# Patient Record
Sex: Female | Born: 1955
Health system: Southern US, Community
[De-identification: ages and names within clinical notes are randomized; demographics above are authoritative.]

## PROBLEM LIST (undated history)

## (undated) DIAGNOSIS — F419 Anxiety disorder, unspecified: Secondary | ICD-10-CM

## (undated) DIAGNOSIS — I10 Essential (primary) hypertension: Secondary | ICD-10-CM

## (undated) DIAGNOSIS — F319 Bipolar disorder, unspecified: Secondary | ICD-10-CM

## (undated) DIAGNOSIS — T7840XA Allergy, unspecified, initial encounter: Secondary | ICD-10-CM

## (undated) DIAGNOSIS — D649 Anemia, unspecified: Secondary | ICD-10-CM

## (undated) DIAGNOSIS — F1910 Other psychoactive substance abuse, uncomplicated: Secondary | ICD-10-CM

## (undated) DIAGNOSIS — F329 Major depressive disorder, single episode, unspecified: Secondary | ICD-10-CM

## (undated) DIAGNOSIS — K219 Gastro-esophageal reflux disease without esophagitis: Secondary | ICD-10-CM

## (undated) DIAGNOSIS — M1990 Unspecified osteoarthritis, unspecified site: Secondary | ICD-10-CM

## (undated) DIAGNOSIS — M419 Scoliosis, unspecified: Secondary | ICD-10-CM

## (undated) DIAGNOSIS — F32A Depression, unspecified: Secondary | ICD-10-CM

## (undated) DIAGNOSIS — T4145XA Adverse effect of unspecified anesthetic, initial encounter: Secondary | ICD-10-CM

## (undated) DIAGNOSIS — T8859XA Other complications of anesthesia, initial encounter: Secondary | ICD-10-CM

## (undated) HISTORY — DX: Gastro-esophageal reflux disease without esophagitis: K21.9

## (undated) HISTORY — DX: Anemia, unspecified: D64.9

## (undated) HISTORY — DX: Bipolar disorder, unspecified: F31.9

## (undated) HISTORY — DX: Allergy, unspecified, initial encounter: T78.40XA

## (undated) HISTORY — DX: Anxiety disorder, unspecified: F41.9

## (undated) HISTORY — DX: Other psychoactive substance abuse, uncomplicated: F19.10

## (undated) HISTORY — DX: Essential (primary) hypertension: I10

## (undated) HISTORY — PX: TONSILECTOMY, ADENOIDECTOMY, BILATERAL MYRINGOTOMY AND TUBES: SHX2538

## (undated) HISTORY — DX: Major depressive disorder, single episode, unspecified: F32.9

## (undated) HISTORY — PX: TONSILLECTOMY: SUR1361

## (undated) HISTORY — DX: Depression, unspecified: F32.A

## (undated) HISTORY — PX: CARPAL TUNNEL RELEASE: SHX101

## (undated) HISTORY — PX: OTHER SURGICAL HISTORY: SHX169

## (undated) HISTORY — DX: Unspecified osteoarthritis, unspecified site: M19.90

---

## 1981-08-26 HISTORY — PX: OTHER SURGICAL HISTORY: SHX169

## 1998-09-26 DIAGNOSIS — I10 Essential (primary) hypertension: Secondary | ICD-10-CM | POA: Insufficient documentation

## 2002-10-29 ENCOUNTER — Emergency Department (HOSPITAL_COMMUNITY): Admission: EM | Admit: 2002-10-29 | Discharge: 2002-10-29 | Payer: Self-pay | Admitting: *Deleted

## 2002-10-29 ENCOUNTER — Encounter: Payer: Self-pay | Admitting: *Deleted

## 2002-12-28 ENCOUNTER — Encounter (HOSPITAL_COMMUNITY): Admission: RE | Admit: 2002-12-28 | Discharge: 2003-01-27 | Payer: Self-pay | Admitting: Orthopedic Surgery

## 2005-12-11 ENCOUNTER — Emergency Department (HOSPITAL_COMMUNITY): Admission: EM | Admit: 2005-12-11 | Discharge: 2005-12-11 | Payer: Self-pay | Admitting: Emergency Medicine

## 2006-01-28 ENCOUNTER — Emergency Department (HOSPITAL_COMMUNITY): Admission: EM | Admit: 2006-01-28 | Discharge: 2006-01-28 | Payer: Self-pay | Admitting: Emergency Medicine

## 2006-01-31 ENCOUNTER — Emergency Department (HOSPITAL_COMMUNITY): Admission: EM | Admit: 2006-01-31 | Discharge: 2006-01-31 | Payer: Self-pay | Admitting: Emergency Medicine

## 2009-04-21 ENCOUNTER — Ambulatory Visit: Payer: Self-pay | Admitting: Gastroenterology

## 2009-04-21 ENCOUNTER — Encounter (INDEPENDENT_AMBULATORY_CARE_PROVIDER_SITE_OTHER): Payer: Self-pay | Admitting: *Deleted

## 2009-04-21 DIAGNOSIS — K219 Gastro-esophageal reflux disease without esophagitis: Secondary | ICD-10-CM | POA: Insufficient documentation

## 2009-04-21 DIAGNOSIS — F341 Dysthymic disorder: Secondary | ICD-10-CM | POA: Insufficient documentation

## 2009-04-21 DIAGNOSIS — Z8659 Personal history of other mental and behavioral disorders: Secondary | ICD-10-CM | POA: Insufficient documentation

## 2009-04-21 DIAGNOSIS — K5900 Constipation, unspecified: Secondary | ICD-10-CM | POA: Insufficient documentation

## 2009-04-21 DIAGNOSIS — R6881 Early satiety: Secondary | ICD-10-CM | POA: Insufficient documentation

## 2009-04-21 DIAGNOSIS — M4120 Other idiopathic scoliosis, site unspecified: Secondary | ICD-10-CM | POA: Insufficient documentation

## 2009-04-21 DIAGNOSIS — J45909 Unspecified asthma, uncomplicated: Secondary | ICD-10-CM | POA: Insufficient documentation

## 2009-04-21 DIAGNOSIS — R1013 Epigastric pain: Secondary | ICD-10-CM | POA: Insufficient documentation

## 2009-04-24 ENCOUNTER — Telehealth (INDEPENDENT_AMBULATORY_CARE_PROVIDER_SITE_OTHER): Payer: Self-pay

## 2009-04-25 ENCOUNTER — Encounter: Payer: Self-pay | Admitting: Gastroenterology

## 2009-05-08 ENCOUNTER — Ambulatory Visit (HOSPITAL_COMMUNITY): Admission: RE | Admit: 2009-05-08 | Discharge: 2009-05-08 | Payer: Self-pay | Admitting: Gastroenterology

## 2009-05-08 ENCOUNTER — Ambulatory Visit: Payer: Self-pay | Admitting: Gastroenterology

## 2009-05-08 ENCOUNTER — Encounter: Payer: Self-pay | Admitting: Gastroenterology

## 2009-05-09 ENCOUNTER — Encounter: Payer: Self-pay | Admitting: Urgent Care

## 2009-05-10 ENCOUNTER — Encounter (INDEPENDENT_AMBULATORY_CARE_PROVIDER_SITE_OTHER): Payer: Self-pay

## 2009-05-17 ENCOUNTER — Telehealth (INDEPENDENT_AMBULATORY_CARE_PROVIDER_SITE_OTHER): Payer: Self-pay

## 2009-05-22 ENCOUNTER — Encounter: Payer: Self-pay | Admitting: Gastroenterology

## 2009-12-13 ENCOUNTER — Encounter: Admission: RE | Admit: 2009-12-13 | Discharge: 2009-12-13 | Payer: Self-pay | Admitting: Emergency Medicine

## 2010-03-19 ENCOUNTER — Ambulatory Visit (HOSPITAL_COMMUNITY): Admission: RE | Admit: 2010-03-19 | Discharge: 2010-03-19 | Payer: Self-pay | Admitting: Gastroenterology

## 2010-05-17 ENCOUNTER — Encounter (HOSPITAL_COMMUNITY): Admission: RE | Admit: 2010-05-17 | Discharge: 2010-07-26 | Payer: Self-pay | Admitting: Nephrology

## 2010-05-21 ENCOUNTER — Inpatient Hospital Stay (HOSPITAL_COMMUNITY): Admission: RE | Admit: 2010-05-21 | Discharge: 2010-05-24 | Payer: Self-pay | Admitting: Surgery

## 2010-05-21 HISTORY — PX: OTHER SURGICAL HISTORY: SHX169

## 2010-11-08 LAB — CBC
HCT: 26.9 % — ABNORMAL LOW (ref 36.0–46.0)
HCT: 27.7 % — ABNORMAL LOW (ref 36.0–46.0)
HCT: 30.8 % — ABNORMAL LOW (ref 36.0–46.0)
Hemoglobin: 8.8 g/dL — ABNORMAL LOW (ref 12.0–15.0)
Hemoglobin: 9.2 g/dL — ABNORMAL LOW (ref 12.0–15.0)
MCH: 28.7 pg (ref 26.0–34.0)
MCH: 28.8 pg (ref 26.0–34.0)
MCHC: 32.9 g/dL (ref 30.0–36.0)
MCV: 86.1 fL (ref 78.0–100.0)
MCV: 87.8 fL (ref 78.0–100.0)
Platelets: 219 10*3/uL (ref 150–400)
RBC: 2.92 MIL/uL — ABNORMAL LOW (ref 3.87–5.11)
RBC: 3.06 MIL/uL — ABNORMAL LOW (ref 3.87–5.11)
RDW: 15.8 % — ABNORMAL HIGH (ref 11.5–15.5)
RDW: 16.6 % — ABNORMAL HIGH (ref 11.5–15.5)
WBC: 5.2 10*3/uL (ref 4.0–10.5)
WBC: 6.7 10*3/uL (ref 4.0–10.5)

## 2010-11-08 LAB — POTASSIUM
Potassium: 3.4 mEq/L — ABNORMAL LOW (ref 3.5–5.1)
Potassium: 3.6 mEq/L (ref 3.5–5.1)

## 2010-11-08 LAB — CREATININE, SERUM
Creatinine, Ser: 1.14 mg/dL (ref 0.4–1.2)
GFR calc Af Amer: >60 mL/min (ref >60)
GFR calc non Af Amer: 50 mL/min — ABNORMAL LOW (ref >60)

## 2010-11-08 LAB — BASIC METABOLIC PANEL
BUN: 14 mg/dL (ref 6–23)
Chloride: 114 mEq/L — ABNORMAL HIGH (ref 96–112)
Glucose, Bld: 94 mg/dL (ref 70–99)
Potassium: 4.1 mEq/L (ref 3.5–5.1)

## 2010-11-08 LAB — SURGICAL PCR SCREEN: Staphylococcus aureus: INVALID — AB

## 2011-01-01 ENCOUNTER — Other Ambulatory Visit: Payer: Self-pay | Admitting: Physical Medicine and Rehabilitation

## 2011-01-01 DIAGNOSIS — M545 Low back pain, unspecified: Secondary | ICD-10-CM

## 2011-01-26 ENCOUNTER — Ambulatory Visit
Admission: RE | Admit: 2011-01-26 | Discharge: 2011-01-26 | Disposition: A | Discharge status: Home / Self Care | Payer: 59 | Source: Ambulatory Visit | Attending: Physical Medicine and Rehabilitation | Admitting: Physical Medicine and Rehabilitation

## 2011-01-26 DIAGNOSIS — M545 Low back pain, unspecified: Secondary | ICD-10-CM

## 2011-09-02 ENCOUNTER — Ambulatory Visit (INDEPENDENT_AMBULATORY_CARE_PROVIDER_SITE_OTHER): Payer: 59

## 2011-09-02 DIAGNOSIS — R110 Nausea: Secondary | ICD-10-CM

## 2011-09-02 DIAGNOSIS — R42 Dizziness and giddiness: Secondary | ICD-10-CM

## 2011-09-02 DIAGNOSIS — H6120 Impacted cerumen, unspecified ear: Secondary | ICD-10-CM

## 2011-09-04 ENCOUNTER — Telehealth (INDEPENDENT_AMBULATORY_CARE_PROVIDER_SITE_OTHER): Payer: Self-pay

## 2011-09-04 NOTE — Telephone Encounter (Signed)
Get an UGI to evaluate the wrap Have the pt do thin, noncarbonated liquids x 2 days & readvance diet slowly (pureed - soft - reg)

## 2011-09-04 NOTE — Telephone Encounter (Signed)
Returned pt's voicemail message for her to call me back./ AS

## 2011-09-04 NOTE — Telephone Encounter (Signed)
Pt returned my call. The pt is having some issues with heartburn and reflux after eating meals. The pt has actually vomited several times with this reflux. The pt said this heartburn is different from before surgery.I asked pt if her food was sticking and she said no there is no problem with food going down. This started after her Lap Nissen in September 2012.  The pt had alcohol the other night and got really sick afterwards with vomiting. The pt has an appt with Dr Michaell Cowing on 09-16-11. I will send Dr Michaell Cowing a message to see if he wants pt to have a xray or labs done before her appt on 09-16-11.

## 2011-09-09 ENCOUNTER — Telehealth (INDEPENDENT_AMBULATORY_CARE_PROVIDER_SITE_OTHER): Payer: Self-pay

## 2011-09-09 DIAGNOSIS — R12 Heartburn: Secondary | ICD-10-CM

## 2011-09-09 NOTE — Telephone Encounter (Signed)
[facility] notifying pt that we were going to schedule pt for an xray before her f/u appt with Dr Michaell Cowing on 09-16-11. I gave the xray request to Stanton Kidney and she will contact the  Pt.

## 2011-09-12 ENCOUNTER — Ambulatory Visit (HOSPITAL_COMMUNITY)
Admission: RE | Admit: 2011-09-12 | Discharge: 2011-09-12 | Disposition: A | Discharge status: Home / Self Care | Payer: 59 | Source: Ambulatory Visit | Attending: Surgery | Admitting: Surgery

## 2011-09-12 DIAGNOSIS — K449 Diaphragmatic hernia without obstruction or gangrene: Secondary | ICD-10-CM | POA: Insufficient documentation

## 2011-09-12 DIAGNOSIS — R079 Chest pain, unspecified: Secondary | ICD-10-CM | POA: Insufficient documentation

## 2011-09-12 DIAGNOSIS — M47812 Spondylosis without myelopathy or radiculopathy, cervical region: Secondary | ICD-10-CM | POA: Insufficient documentation

## 2011-09-12 DIAGNOSIS — R12 Heartburn: Secondary | ICD-10-CM

## 2011-09-12 DIAGNOSIS — K225 Diverticulum of esophagus, acquired: Secondary | ICD-10-CM | POA: Insufficient documentation

## 2011-09-12 DIAGNOSIS — R1013 Epigastric pain: Secondary | ICD-10-CM | POA: Insufficient documentation

## 2011-09-16 ENCOUNTER — Encounter (INDEPENDENT_AMBULATORY_CARE_PROVIDER_SITE_OTHER): Payer: Self-pay | Admitting: Surgery

## 2011-09-16 ENCOUNTER — Ambulatory Visit (INDEPENDENT_AMBULATORY_CARE_PROVIDER_SITE_OTHER): Payer: 59 | Admitting: Surgery

## 2011-09-16 DIAGNOSIS — K219 Gastro-esophageal reflux disease without esophagitis: Secondary | ICD-10-CM

## 2011-09-16 DIAGNOSIS — K449 Diaphragmatic hernia without obstruction or gangrene: Secondary | ICD-10-CM | POA: Insufficient documentation

## 2011-09-16 MED ORDER — METOCLOPRAMIDE HCL 5 MG PO TABS: 5.0000 mg | ORAL_TABLET | Freq: Four times a day (QID) | ORAL | Status: DC | PRN

## 2011-09-16 NOTE — Progress Notes (Signed)
Subjective:     Patient ID: Cheryl Hoover, female   DOB: 10-08-1955, 56 y.o.   MRN: [id number]  HPI  Cheryl Hoover  Jul 07, 1956 [id number]  Patient Care Team: Lucilla Edin, MD as PCP - General (Family Medicine) Theda Belfast, MD as Consulting Physician (Gastroenterology) Arlyce Harman, MD as Consulting Physician (Gastroenterology)  This patient is a 56 y.o.female who presents today for surgical evaluation.   Reason for visit: Retching/vomiting, recurrent reflux s/p lap PEH/Nissen  Pt is an obese female that I did a lap repair of a moderate paraesophageal hiatal hernia & Nissen fundoplication.  She had mild dysphagia but had good GERD/reflux control.  Occasional nausea controlled w promethazine - makes her sleepy.  More recently has used ondansetron.  She has had some episodes of severe nausea & retching & has been able to vomit.  She has gained 46lbs since before surgery - she claims her weight fluctuates like that.  She called noting reflux.  We did an UGI which shows the wrap has slipped a little & she has a small sliding recurrent hiatal hernia.  I recommended she start PPI omeprazole - that seems to help.  No fever/chills/sweats.  No sick contacts  Patient Active Problem List  Diagnoses  . ANXIETY DEPRESSION  . ASTHMA, MILD  . GERD  . CONSTIPATION  . SCOLIOSIS  . EARLY SATIETY  . ABDOMINAL PAIN, EPIGASTRIC  . ANOREXIA NERVOSA, HX OF  . BULIMIA, HX OF  . Sliding hiatal hernia, recurrent    Past Medical History  Diagnosis Date  . Allergy   . Anemia   . Anxiety   . Arthritis   . Asthma   . Depression   . GERD (gastroesophageal reflux disease)   . Hypertension     Past Surgical History  Procedure Date  . Lap nissen w/paraesophageal hernia repari 05-21-2010  . Carpal tunnel release   . Cesarean section 1982  . C-sections 1983  . Tonsilectomy, adenoidectomy, bilateral myringotomy and tubes   . Corrective eye surgery     History   Social History  .  Marital Status: Divorced    Spouse Name: N/A    Number of Children: N/A  . Years of Education: N/A   Occupational History  . Not on file.   Social History Main Topics  . Smoking status: Former Games developer  . Smokeless tobacco: Not on file  . Alcohol Use: Yes  . Drug Use: No  . Sexually Active:    Other Topics Concern  . Not on file   Social History Narrative  . No narrative on file    History reviewed. No pertinent family history.  Current outpatient prescriptions:ALPRAZolam (XANAX) 0.5 MG tablet, Take 0.5 mg by mouth at bedtime as needed., Disp: , Rfl: ;  buPROPion (WELLBUTRIN) 100 MG tablet, Take 200 mg by mouth 2 (two) times daily., Disp: , Rfl: ;  lamoTRIgine (LAMICTAL) 100 MG tablet, Take 100 mg by mouth daily., Disp: , Rfl: ;  lisinopril (PRINIVIL,ZESTRIL) 10 MG tablet, Take 10 mg by mouth daily., Disp: , Rfl:  montelukast (SINGULAIR) 10 MG tablet, Take 10 mg by mouth at bedtime., Disp: , Rfl: ;  ondansetron (ZOFRAN-ODT) 4 MG disintegrating tablet, Take 4 mg by mouth every 8 (eight) hours as needed., Disp: , Rfl: ;  promethazine (PHENERGAN) 12.5 MG tablet, Take 12.5-25 mg by mouth every 6 (six) hours as needed., Disp: , Rfl:  metoCLOPramide (REGLAN) 5 MG tablet, Take 1-2 tablets (5-10 mg total) by  mouth 4 (four) times daily as needed (use for breakthrough, severe nausea)., Disp: 30 tablet, Rfl: 3  Allergies  Allergen Reactions  . Penicillins     BP 132/82  Pulse 78  Temp(Src) 97.1 F (36.2 C) (Temporal)  Resp 16  Ht 5\' 6"  (1.676 m)  Wt 222 lb (100.699 kg)  BMI 35.83 kg/m2     Review of Systems  Constitutional: Negative for fever, chills and diaphoresis.  HENT: Negative for ear pain, sore throat and trouble swallowing.   Eyes: Negative for photophobia and visual disturbance.  Respiratory: Negative for cough and choking.   Cardiovascular: Negative for chest pain and palpitations.  Gastrointestinal: Positive for nausea and vomiting. Negative for abdominal pain,  diarrhea, constipation, blood in stool, anal bleeding and rectal pain.       Mild early satiety.  No severe bloating. No personal nor family history of GI/colon cancer, inflammatory bowel disease, irritable bowel syndrome, allergy such as Celiac Sprue, dietary/dairy problems, colitis, ulcers nor gastritis.    No recent sick contacts/gastroenteritis.  No travel outside the country.  No changes in diet.    Genitourinary: Negative for dysuria, frequency and difficulty urinating.  Musculoskeletal: Negative for myalgias and gait problem.  Skin: Negative for color change, pallor and rash.  Neurological: Negative for dizziness, speech difficulty, weakness and numbness.  Hematological: Negative for adenopathy.  Psychiatric/Behavioral: Negative for confusion and agitation. The patient is not nervous/anxious.        Objective:   Physical Exam  Constitutional: She is oriented to person, place, and time. She appears well-developed and well-nourished. No distress.  HENT:  Head: Normocephalic.  Mouth/Throat: Oropharynx is clear and moist. No oropharyngeal exudate.  Eyes: Conjunctivae and EOM are normal. Pupils are equal, round, and reactive to light. No scleral icterus.  Neck: Normal range of motion. Neck supple. No tracheal deviation present.  Cardiovascular: Normal rate, regular rhythm and intact distal pulses.   Pulmonary/Chest: Effort normal and breath sounds normal. No respiratory distress. She exhibits no tenderness.  Abdominal: Soft. She exhibits no distension and no mass. There is no tenderness. There is no rebound and no guarding. Hernia confirmed negative in the right inguinal area and confirmed negative in the left inguinal area.       Obese.  Apple-like/central obesity  Genitourinary: No vaginal discharge found.  Musculoskeletal: Normal range of motion. She exhibits no tenderness.  Lymphadenopathy:    She has no cervical adenopathy.       Right: No inguinal adenopathy present.        Left: No inguinal adenopathy present.  Neurological: She is alert and oriented to person, place, and time. No cranial nerve deficit. She exhibits normal muscle tone. Coordination normal.  Skin: Skin is warm and dry. No rash noted. She is not diaphoretic. No erythema.  Psychiatric: She has a normal mood and affect. Her behavior is normal. Judgment and thought content normal.       Assessment:     Recurrent sliding PEH with symptoms    Plan:     -PPI daily-BID to control Sx  -switch to smaller meals  -Control nausea more aggressively. Reglan for severe breakthrough N/V.  Consider gastric emptying study if not improved  -try to lose weight - increasing obesity markedly increases hernia worsening.  -I would avoid surgery if possible since OR risks with redo surgery are higher, and she would requires mesh to decrease recurrence.  Best to control the causes as possible.  -If has worsening Sx, consider e-evaluation with  GI to see if other options are available to maximize medical care

## 2011-09-16 NOTE — Patient Instructions (Signed)
Diet for GERD    Nutrition therapy can help ease the discomfort of gastroesophageal reflux disease (GERD) and peptic ulcer disease (PUD).  HOME CARE INSTRUCTIONS   Eat your meals slowly, in a relaxed setting.   Eat 5 to 6 small meals per day.   If a food causes distress, stop eating it for a period of time.  FOODS TO AVOID  Coffee, regular or decaffeinated.   Cola beverages, regular or low calorie.   Tea, regular or decaffeinated.   Pepper.   Cocoa.   High fat foods, including meats.   Butter, margarine, hydrogenated oil (trans fats).   Peppermint or spearmint (if you have GERD).   Fruits and vegetables if not tolerated.   Alcohol.   Nicotine (smoking or chewing). This is one of the most potent stimulants to acid production in the gastrointestinal tract.   Any food that seems to aggravate your condition.  If you have questions regarding your diet, ask your caregiver or a registered dietitian. TIPS  Lying flat may make symptoms worse. Keep the head of your bed raised 6 to 9 inches (15 to 23 cm) by using a foam wedge or blocks under the legs of the bed.   Do not lay down until 3 hours after eating a meal.   Daily physical activity may help reduce symptoms.  MAKE SURE YOU:   Understand these instructions.   Will watch your condition.   Will get help right away if you are not doing well or get worse.  Document Released: 08/12/2005 Document Revised: 04/24/2011 Document Reviewed: 12/26/2008 [employer] Patient Information 2012 [employer]. 

## 2011-09-18 NOTE — Progress Notes (Signed)
Pt last seen by DR.  in 2010. Pt not a pt of [facility].

## 2011-10-20 ENCOUNTER — Other Ambulatory Visit: Payer: Self-pay | Admitting: Emergency Medicine

## 2011-10-20 ENCOUNTER — Other Ambulatory Visit: Payer: Self-pay | Admitting: Physician Assistant

## 2011-11-07 ENCOUNTER — Other Ambulatory Visit: Payer: Self-pay | Admitting: Emergency Medicine

## 2011-12-05 ENCOUNTER — Encounter: Payer: Self-pay | Admitting: *Deleted

## 2011-12-05 ENCOUNTER — Ambulatory Visit (INDEPENDENT_AMBULATORY_CARE_PROVIDER_SITE_OTHER): Payer: 59 | Admitting: Internal Medicine

## 2011-12-05 VITALS — HR 96 | Temp 98.8°F | Resp 18 | Ht 64.5 in | Wt 211.0 lb

## 2011-12-05 DIAGNOSIS — M542 Cervicalgia: Secondary | ICD-10-CM

## 2011-12-05 DIAGNOSIS — I10 Essential (primary) hypertension: Secondary | ICD-10-CM

## 2011-12-05 DIAGNOSIS — J309 Allergic rhinitis, unspecified: Secondary | ICD-10-CM

## 2011-12-05 MED ORDER — KETOROLAC TROMETHAMINE 60 MG/2ML IM SOLN
60.0000 mg | Freq: Once | INTRAMUSCULAR | Status: AC
  Administered 2011-12-05: 60 mg via INTRAMUSCULAR

## 2011-12-05 MED ORDER — LISINOPRIL 5 MG PO TABS: 5.0000 mg | ORAL_TABLET | Freq: Once | ORAL | Status: DC

## 2011-12-05 MED ORDER — MONTELUKAST SODIUM 10 MG PO TABS: 10.0000 mg | ORAL_TABLET | Freq: Every day | ORAL | Status: DC

## 2011-12-05 MED ORDER — ALBUTEROL SULFATE HFA 108 (90 BASE) MCG/ACT IN AERS: 2.0000 | INHALATION_SPRAY | Freq: Four times a day (QID) | RESPIRATORY_TRACT | Status: DC | PRN

## 2011-12-05 MED ORDER — FLUTICASONE-SALMETEROL 250-50 MCG/DOSE IN AEPB: 1.0000 | INHALATION_SPRAY | Freq: Two times a day (BID) | RESPIRATORY_TRACT | Status: DC

## 2011-12-05 NOTE — Patient Instructions (Signed)
For her neck pain use heat and gentle massage, be sure your pillow is supportive of your neck when you are sleeping.  Return if this pain persists and does not get better in 3 days. For your sinus congestion restart your Patanase spray.  Take your Advair twice daily everyday during allergy season.  Return if not better in three days.  MAKE AN APPOINTMENT FOR AN ANNUAL EXAM TO COVER ALL YOUR OTHER CONCERNS, WE NEED FOR YOU TO DO THIS BEFORE WE CAN REFILL YOUR MEDICATION Torticollis, Acute You have suddenly (acutely) developed a twisted neck (torticollis). This is usually a self-limited condition. CAUSES  Acute torticollis may be caused by malposition, trauma or infection. Most commonly, acute torticollis is caused by sleeping in an awkward position. Torticollis may also be caused by the flexion, extension or twisting of the neck muscles beyond their normal position. Sometimes, the exact cause may not be known. SYMPTOMS  Usually, there is pain and limited movement of the neck. Your neck may twist to one side. DIAGNOSIS  The diagnosis is often made by physical examination. X-rays, CT scans or MRIs may be done if there is a history of trauma or concern of infection. TREATMENT  For a common, stiff neck that develops during sleep, treatment is focused on relaxing the contracted neck muscle. Medications (including shots) may be used to treat the problem. Most cases resolve in several days. Torticollis usually responds to conservative physical therapy. If left untreated, the shortened and spastic neck muscle can cause deformities in the face and neck. Rarely, surgery is required. HOME CARE INSTRUCTIONS   Use over-the-counter and prescription medications as directed by your caregiver.   Do stretching exercises and massage the neck as directed by your caregiver.   Follow up with physical therapy if needed and as directed by your caregiver.  SEEK IMMEDIATE MEDICAL CARE IF:   You develop difficulty  breathing or noisy breathing (stridor).   You drool, develop trouble swallowing or have pain with swallowing.   You develop numbness or weakness in the hands or feet.   You have changes in speech or vision.   You have problems with urination or bowel movements.   You have difficulty walking.   You have a fever.   You have increased pain.  MAKE SURE YOU:   Understand these instructions.   Will watch your condition.   Will get help right away if you are not doing well or get worse.  Document Released: 08/09/2000 Document Revised: 08/01/2011 Document Reviewed: 09/20/2009 [facility] Patient Information 2012 [place], [place].

## 2011-12-05 NOTE — Progress Notes (Signed)
  Subjective:    Patient ID: Cheryl Hoover, female    DOB: 1956-04-04, 56 y.o.   MRN: [id number]  Sinusitis This is a new problem. The current episode started in the past 7 days. The problem is unchanged. The pain is moderate. Associated symptoms include congestion, headaches, a hoarse voice, neck pain and sinus pressure. Pertinent negatives include no coughing. Past treatments include nothing.  Cheryl Hoover comes in complaining of headache which is actually in her neck just beneath her skull, it has been present for two days ever since she left for a long period of time, hurts more to move her head.  She has had no fever, vomiting, or vision changes.  She has allergies and has not been taking her Advair except sometimes once a day.  She is asking for refills today on her Singulaire, Lisinopril (thinks she is on the 5 mg dose), Advair, and Albuterol inhalers.  Not really coughing but feels as though she can't take a deep breath.    Review of Systems  HENT: Positive for congestion, hoarse voice, neck pain and sinus pressure.   Eyes: Negative.   Respiratory: Positive for wheezing. Negative for apnea, cough, choking and chest tightness.   Cardiovascular: Negative.   Gastrointestinal: Negative.   Genitourinary: Negative.   Neurological: Positive for headaches.  Hematological: Negative.   Psychiatric/Behavioral: Negative.        Objective:   Physical Exam  Constitutional: She appears well-developed and well-nourished.  HENT:  Head: Normocephalic.  Right Ear: External ear normal.  Left Ear: External ear normal.  Mouth/Throat: Oropharynx is clear and moist. No oropharyngeal exudate.       No maxillary sinus tenderness, TM's clear  Eyes: Conjunctivae are normal.  Neck: Neck supple.  Cardiovascular: Normal rate, regular rhythm and normal heart sounds.        Pulse is 98  Pulmonary/Chest: Effort normal and breath sounds normal. No respiratory distress. She has no wheezes. She has no rales.    Lymphadenopathy:    She has no cervical adenopathy.          Assessment & Plan:  Allergic Rhinitis:  No sign of acute bacterial infection today on exam.  Take her Advair twice daily, use albuterol inhaler as needed.  Also add Patanase (which she says she has refills of) for her sinus congestion.  She takes Singulaire daily and may need an OTC antihistamine also, such as Claritin 10 mg, she is advised of this. Wry neck.  Toradol 60 mg IM today.  Use heat and gentle massage, advise [place] if this is not improving in 2-3 days.  Be sure you have a good supportive pillow at home to sleep on. HTN, refilled lisinopril today, needs to return for CPE, she agrees to do this soon.

## 2011-12-11 ENCOUNTER — Other Ambulatory Visit: Payer: Self-pay | Admitting: Physician Assistant

## 2011-12-27 ENCOUNTER — Other Ambulatory Visit: Payer: Self-pay | Admitting: Physician Assistant

## 2012-01-17 ENCOUNTER — Encounter: Payer: Self-pay | Admitting: Family Medicine

## 2012-01-17 ENCOUNTER — Ambulatory Visit (INDEPENDENT_AMBULATORY_CARE_PROVIDER_SITE_OTHER): Payer: 59 | Admitting: Family Medicine

## 2012-01-17 VITALS — BP 153/86 | HR 84 | Temp 97.7°F | Resp 16 | Ht 63.75 in | Wt 215.0 lb

## 2012-01-17 DIAGNOSIS — J301 Allergic rhinitis due to pollen: Secondary | ICD-10-CM

## 2012-01-17 DIAGNOSIS — J45909 Unspecified asthma, uncomplicated: Secondary | ICD-10-CM

## 2012-01-17 DIAGNOSIS — I10 Essential (primary) hypertension: Secondary | ICD-10-CM

## 2012-01-17 DIAGNOSIS — K219 Gastro-esophageal reflux disease without esophagitis: Secondary | ICD-10-CM

## 2012-01-17 LAB — BASIC METABOLIC PANEL
BUN: 18 mg/dL (ref 6–23)
Calcium: 9.2 mg/dL (ref 8.4–10.5)
Potassium: 4.4 mEq/L (ref 3.5–5.3)
Sodium: 140 mEq/L (ref 135–145)

## 2012-01-17 MED ORDER — FLUTICASONE-SALMETEROL 250-50 MCG/DOSE IN AEPB: 1.0000 | INHALATION_SPRAY | Freq: Two times a day (BID) | RESPIRATORY_TRACT | Status: DC

## 2012-01-17 MED ORDER — OMEPRAZOLE 20 MG PO CPDR: 20.0000 mg | DELAYED_RELEASE_CAPSULE | Freq: Every day | ORAL | Status: DC

## 2012-01-17 MED ORDER — LISINOPRIL 5 MG PO TABS: 5.0000 mg | ORAL_TABLET | Freq: Every day | ORAL | Status: DC

## 2012-01-17 MED ORDER — ALBUTEROL SULFATE HFA 108 (90 BASE) MCG/ACT IN AERS: 2.0000 | INHALATION_SPRAY | Freq: Four times a day (QID) | RESPIRATORY_TRACT | Status: DC | PRN

## 2012-01-17 MED ORDER — MONTELUKAST SODIUM 10 MG PO TABS: 10.0000 mg | ORAL_TABLET | Freq: Every day | ORAL | Status: DC

## 2012-01-17 NOTE — Progress Notes (Signed)
   Subjective:    Patient ID: Cheryl Hoover, female    DOB: 12-16-1955, 56 y.o.   MRN: [id number]  HPI Cheryl Hoover is a 56 y.o. female Usual patient fo Dr. Cleta Alberts.  Needs refills.  HTn - ran out of lisinopril last week. Usually on 5 mg, but may When taking meds, has not checked outside  BP's.  No new side effects.  Asthma - on Advair 250/50 - only taking once per day  Due to cost. Taking albuterol every morning to allow advair to work better. Taking singulair.  Allergies - using patanase NS but costly, not taking any oral antihistamines. Occasional afrin - 2 times per week.   GERD - takes prilosec qd.  Hx of hiatal hernia- heartburn returned. Was told to take prilosec.- store brand expensive, but tolerating med.  Review of Systems  Constitutional: Negative for fatigue and unexpected weight change.  Respiratory: Negative for chest tightness and shortness of breath.   Cardiovascular: Negative for chest pain, palpitations and leg swelling.  Gastrointestinal: Negative for abdominal pain and blood in stool.  Neurological: Negative for dizziness, syncope, light-headedness and headaches.       Objective:   Physical Exam  Constitutional: She is oriented to person, place, and time. She appears well-developed and well-nourished.  HENT:  Head: Normocephalic and atraumatic.  Right Ear: External ear normal.  Left Ear: External ear normal.  Mouth/Throat: Oropharynx is clear and moist.  Eyes: Conjunctivae and EOM are normal. Pupils are equal, round, and reactive to light.  Neck: Normal range of motion. No thyromegaly present.  Cardiovascular: Normal rate, normal heart sounds and intact distal pulses.   Pulmonary/Chest: No respiratory distress. She has no wheezes.  Lymphadenopathy:    She has no cervical adenopathy.  Neurological: She is alert and oriented to person, place, and time.  Skin: Skin is warm.  Psychiatric: She has a normal mood and affect. Her behavior is normal.           Assessment & Plan:   Cheryl Hoover is a 56 y.o. female  HTN - Check outside BP's - if >140/90 - needs to rtc for higher dose.  Continue lisinopril 5mg  qd for now.   Asthma stable.  Using albuterol as a preventative in am with advair.  Instructed to chang to prn use Advair daily.  If requiring albuterol greater than 3x/week - needs to take Advair BID, and rtc to discuss regimen. - if well controlled, may be able to change to ICS alone.   Cont singulair 10mg  QD. - meds refilled, no change in doses.  Allergies - try otc claritin - may decrease requirement of nasal spray. Decrease use of Afrin - discussed risk of rhinitis medicamentosa.  Cont same regimen with patanase (refilled and coupon given), but as allergies improve, consider change to less expensive option.  Recheck next 3 months with primary provider.

## 2012-02-14 ENCOUNTER — Ambulatory Visit: Payer: 59 | Admitting: Family Medicine

## 2012-04-21 ENCOUNTER — Ambulatory Visit (INDEPENDENT_AMBULATORY_CARE_PROVIDER_SITE_OTHER): Payer: 59 | Admitting: Emergency Medicine

## 2012-04-21 VITALS — BP 145/84 | HR 82 | Temp 97.1°F | Resp 16 | Ht 64.0 in | Wt 211.0 lb

## 2012-04-21 DIAGNOSIS — M79609 Pain in unspecified limb: Secondary | ICD-10-CM

## 2012-04-21 DIAGNOSIS — I10 Essential (primary) hypertension: Secondary | ICD-10-CM

## 2012-04-21 DIAGNOSIS — K219 Gastro-esophageal reflux disease without esophagitis: Secondary | ICD-10-CM

## 2012-04-21 DIAGNOSIS — IMO0001 Reserved for inherently not codable concepts without codable children: Secondary | ICD-10-CM

## 2012-04-21 DIAGNOSIS — M25569 Pain in unspecified knee: Secondary | ICD-10-CM

## 2012-04-21 MED ORDER — LISINOPRIL 5 MG PO TABS: 10.0000 mg | ORAL_TABLET | Freq: Every day | ORAL | Status: DC

## 2012-04-21 MED ORDER — LISINOPRIL 5 MG PO TABS: 5.0000 mg | ORAL_TABLET | Freq: Every day | ORAL | Status: DC

## 2012-04-21 MED ORDER — OMEPRAZOLE 20 MG PO CPDR: 20.0000 mg | DELAYED_RELEASE_CAPSULE | Freq: Every day | ORAL | Status: DC

## 2012-04-21 MED ORDER — RANITIDINE HCL 150 MG PO TABS: 150.0000 mg | ORAL_TABLET | Freq: Two times a day (BID) | ORAL | Status: DC

## 2012-04-21 MED ORDER — ALBUTEROL SULFATE HFA 108 (90 BASE) MCG/ACT IN AERS: 2.0000 | INHALATION_SPRAY | Freq: Four times a day (QID) | RESPIRATORY_TRACT | Status: DC | PRN

## 2012-04-21 NOTE — Progress Notes (Signed)
  Subjective:    Patient ID: Cheryl Hoover, female    DOB: 1955/11/12, 56 y.o.   MRN: [id number]  HPI patient here to followup her high blood pressure. About 2 weeks ago she had some discomfort in her left arm and was worried it could be secondary to her heart and so made an appointment. She also has had a swollen area over the lower right thigh she would like checked.    Review of Systems     Objective:   Physical Exam her HEENT exam is unremarkable. She has full range of motion of the left arm there is no tenderness over the left biceps where she was having pain. Chest is clear endoscopy patient percussion. Heart regular rate no murmurs. Abdomen is soft nontender. 2 x 3 cm lipoma over the lower right anterior thigh.  EKG normal      Assessment & Plan:  And increase her blood pressure medications since she is not at goal. Her EKG is normal and I do not think she needs any further evaluation of this. Her left shoulder pain sounded musculoskeletal. The lesion in her right thigh has a very benign feel of a lipoma.

## 2012-04-26 HISTORY — PX: HERNIA REPAIR: SHX51

## 2012-05-06 ENCOUNTER — Other Ambulatory Visit: Payer: Self-pay | Admitting: Family Medicine

## 2012-05-07 ENCOUNTER — Other Ambulatory Visit: Payer: Self-pay | Admitting: Family Medicine

## 2012-05-07 NOTE — Telephone Encounter (Signed)
OK X 1, NEEDS OV FOR MORE?

## 2012-06-16 ENCOUNTER — Telehealth: Payer: Self-pay | Admitting: Radiology

## 2012-06-16 ENCOUNTER — Telehealth: Payer: Self-pay

## 2012-06-16 MED ORDER — MONTELUKAST SODIUM 10 MG PO TABS: ORAL_TABLET | ORAL | Status: DC

## 2012-06-16 MED ORDER — MONTELUKAST SODIUM 10 MG PO TABS: 10.0000 mg | ORAL_TABLET | Freq: Every day | ORAL | Status: DC

## 2012-06-16 NOTE — Telephone Encounter (Signed)
Please advise on renewal of Singulair. Have gotten fax. Sent to [facility] for electronic Rx.  At last renewal patient was advised needs visit with PCP.

## 2012-06-16 NOTE — Telephone Encounter (Signed)
Pt calling to speak with Amy L. [phone]

## 2012-06-16 NOTE — Telephone Encounter (Signed)
Sent singulair to pharmacy, pt needs OV for more - 2nd notice

## 2012-06-16 NOTE — Telephone Encounter (Signed)
Patient called to let us know she has been in since her last renewal of Singulair, have taken off the note about needing follow up. Amy

## 2012-06-16 NOTE — Telephone Encounter (Signed)
Called patient left message for her to advise.

## 2012-06-17 ENCOUNTER — Other Ambulatory Visit: Payer: Self-pay | Admitting: Radiology

## 2012-09-25 ENCOUNTER — Other Ambulatory Visit: Payer: Self-pay | Admitting: Physician Assistant

## 2012-10-13 ENCOUNTER — Other Ambulatory Visit: Payer: Self-pay | Admitting: Family Medicine

## 2012-10-25 ENCOUNTER — Other Ambulatory Visit: Payer: Self-pay | Admitting: Physician Assistant

## 2012-10-25 ENCOUNTER — Other Ambulatory Visit: Payer: Self-pay | Admitting: Emergency Medicine

## 2012-12-07 ENCOUNTER — Other Ambulatory Visit: Payer: Self-pay | Admitting: Physician Assistant

## 2012-12-09 ENCOUNTER — Ambulatory Visit (INDEPENDENT_AMBULATORY_CARE_PROVIDER_SITE_OTHER): Payer: 59 | Admitting: Family Medicine

## 2012-12-09 VITALS — BP 150/100 | HR 81 | Temp 97.8°F | Resp 18 | Ht 63.5 in | Wt 217.0 lb

## 2012-12-09 DIAGNOSIS — J45909 Unspecified asthma, uncomplicated: Secondary | ICD-10-CM

## 2012-12-09 DIAGNOSIS — K219 Gastro-esophageal reflux disease without esophagitis: Secondary | ICD-10-CM

## 2012-12-09 DIAGNOSIS — I10 Essential (primary) hypertension: Secondary | ICD-10-CM

## 2012-12-09 DIAGNOSIS — IMO0001 Reserved for inherently not codable concepts without codable children: Secondary | ICD-10-CM

## 2012-12-09 MED ORDER — FLUTICASONE-SALMETEROL 250-50 MCG/DOSE IN AEPB: INHALATION_SPRAY | RESPIRATORY_TRACT | Status: DC

## 2012-12-09 MED ORDER — MONTELUKAST SODIUM 10 MG PO TABS: ORAL_TABLET | ORAL | Status: DC

## 2012-12-09 MED ORDER — RANITIDINE HCL 150 MG PO TABS: 150.0000 mg | ORAL_TABLET | Freq: Two times a day (BID) | ORAL | Status: DC

## 2012-12-09 MED ORDER — LISINOPRIL 10 MG PO TABS: 10.0000 mg | ORAL_TABLET | Freq: Every day | ORAL | Status: DC

## 2012-12-09 MED ORDER — ALBUTEROL SULFATE HFA 108 (90 BASE) MCG/ACT IN AERS: 2.0000 | INHALATION_SPRAY | RESPIRATORY_TRACT | Status: DC | PRN

## 2012-12-09 NOTE — Progress Notes (Signed)
Chief complaint: Medication refilled  History of present illness: Patient has multiple problems.  1. Hypertension Blood pressure at home: not checking Blood pressure today: 150/100 Taking Meds: no ran out Side effects:no ROS: Denies headache visual changes nausea, vomiting, chest pain or abdominal pain or shortness of breath.  History of asthma: Patient usually uses her Advair as well as Singulair. Patient does have seasonal allergies and this is the worst time of season for her. Patient has had some increasing shortness of breath but denies any swelling of the lower extremities, denies any cough, and states that when she has her inhaler, Ventolin, as well as Advair and her Singulair she usually does fine but unfortunately his abdomen at this time. Patient is still been able to do all her activities of daily living and sleeping fairly comfortably but knows that she needs to have these medications to make it through the spring season.  History of gastroesophageal reflux disease patient also has a history of a hiatal hernia. Patient usually takes omeprazole as well as ranitidine. Patient has been out of her ranitidine for quite some time. Patient states that she has had increasing pain with eating certain foods. Patient is being followed by another physician for this hiatal hernia.  Past medical history, social, surgical and family history all reviewed.   Denies fever, chills, nausea vomiting abdominal pain, dysuria, chest pain, shortness of breath dyspnea on exertion or numbness in extremities  Physical exam Blood pressure 150/100, pulse 81, temperature 97.8 F (36.6 C), temperature source Oral, resp. rate 18, height 5' 3.5" (1.613 m), weight 217 lb (98.431 kg), SpO2 96.00%. Constitutional: She is oriented to person, place, and time. She appears well-developed and well-nourished.  HENT:  Head: Normocephalic and atraumatic.  Right Ear: External ear normal.  Left Ear: External ear normal.   Mouth/Throat: Oropharynx is clear and moist.  Eyes: Conjunctivae and EOM are normal. Pupils are equal, round, and reactive to light.  Neck: Normal range of motion. No thyromegaly present.  Cardiovascular: Normal rate, normal heart sounds and intact distal pulses.  Pulmonary/Chest: No respiratory distress. She has no wheezes.  Lymphadenopathy:  She has no cervical adenopathy.  Neurological: She is alert and oriented to person, place, and time.  Skin: Skin is warm.  Psychiatric: She has a normal mood and affect. Her behavior is normal.    Assessment: 1. HTN 2. Asthma 3. Seasonal allergies  Plan: Refilled Singulair, ventolin, lisinopril, advair and rantidine.  Will return in 2 weeks to recheck blood pressure and BMET.

## 2012-12-09 NOTE — Patient Instructions (Signed)
I refilled the medications you needed.  Please come back in 2 weeks and have your blood drawn to make sure your blood pressure medicine is working and potassium is not too high.

## 2013-05-13 ENCOUNTER — Other Ambulatory Visit: Payer: Self-pay

## 2013-05-13 MED ORDER — LISINOPRIL 10 MG PO TABS: 10.0000 mg | ORAL_TABLET | Freq: Every day | ORAL | Status: DC

## 2013-06-30 ENCOUNTER — Other Ambulatory Visit: Payer: Self-pay

## 2013-06-30 MED ORDER — MONTELUKAST SODIUM 10 MG PO TABS: ORAL_TABLET | ORAL | Status: DC

## 2013-07-15 ENCOUNTER — Other Ambulatory Visit: Payer: Self-pay | Admitting: Physician Assistant

## 2013-07-20 ENCOUNTER — Other Ambulatory Visit: Payer: Self-pay

## 2013-07-20 DIAGNOSIS — IMO0001 Reserved for inherently not codable concepts without codable children: Secondary | ICD-10-CM

## 2013-07-20 MED ORDER — RANITIDINE HCL 150 MG PO TABS: 150.0000 mg | ORAL_TABLET | Freq: Two times a day (BID) | ORAL | Status: DC

## 2013-12-28 ENCOUNTER — Other Ambulatory Visit: Payer: Self-pay

## 2013-12-28 MED ORDER — ALBUTEROL SULFATE HFA 108 (90 BASE) MCG/ACT IN AERS: INHALATION_SPRAY | RESPIRATORY_TRACT | Status: DC

## 2014-05-07 ENCOUNTER — Ambulatory Visit (INDEPENDENT_AMBULATORY_CARE_PROVIDER_SITE_OTHER): Payer: 59 | Admitting: Family Medicine

## 2014-05-07 VITALS — BP 136/92 | HR 73 | Temp 97.3°F | Resp 19 | Ht 63.5 in | Wt 216.4 lb

## 2014-05-07 DIAGNOSIS — H6590 Unspecified nonsuppurative otitis media, unspecified ear: Secondary | ICD-10-CM

## 2014-05-07 DIAGNOSIS — M4120 Other idiopathic scoliosis, site unspecified: Secondary | ICD-10-CM

## 2014-05-07 DIAGNOSIS — M546 Pain in thoracic spine: Secondary | ICD-10-CM

## 2014-05-07 DIAGNOSIS — J441 Chronic obstructive pulmonary disease with (acute) exacerbation: Secondary | ICD-10-CM

## 2014-05-07 MED ORDER — AZITHROMYCIN 250 MG PO TABS: ORAL_TABLET | ORAL | Status: DC

## 2014-05-07 MED ORDER — IPRATROPIUM BROMIDE 0.02 % IN SOLN
0.5000 mg | Freq: Once | RESPIRATORY_TRACT | Status: AC
  Administered 2014-05-07: 0.5 mg via RESPIRATORY_TRACT

## 2014-05-07 MED ORDER — ALBUTEROL SULFATE (2.5 MG/3ML) 0.083% IN NEBU
2.5000 mg | INHALATION_SOLUTION | Freq: Once | RESPIRATORY_TRACT | Status: AC
  Administered 2014-05-07: 2.5 mg via RESPIRATORY_TRACT

## 2014-05-07 MED ORDER — PREDNISONE 20 MG PO TABS: 40.0000 mg | ORAL_TABLET | Freq: Every day | ORAL | Status: DC

## 2014-05-07 NOTE — Progress Notes (Signed)
Subjective:  This chart was scribed for Cheryl Sidle, MD by Elveria Rising, Medial Scribe. This patient was seen in room 2 and the patient's care was started at 11:00 AM.   Patient ID: Cheryl Hoover, female    DOB: Jan 05, 1956, 58 y.o.   MRN: [id number]  HPI HPI Comments: Cheryl Hoover is a 58 y.o. female who presents to the [facility] complaining of productive cough with sputum with shortness of breath, congestion for two weeks now. This week she reports decreased hearing bilaterally. She reports recommendation of hearing aids. Patient denies sleep disturbance with symptoms. Patient reports smoking cessation five years ago.  Patient has history of scoliosis and arthritis for which she takes Hydrocodone. Patient denies physical therapy for her scoliosis, but agrees to treatment if it is available in [place].   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   Patient Active Problem List   Diagnosis Date Noted   Sliding hiatal hernia, recurrent 09/16/2011   ANXIETY DEPRESSION 04/21/2009   ASTHMA, MILD 04/21/2009   GERD 04/21/2009   CONSTIPATION 04/21/2009   SCOLIOSIS 04/21/2009   EARLY SATIETY 04/21/2009   ABDOMINAL PAIN, EPIGASTRIC 04/21/2009   ANOREXIA NERVOSA, HX OF 04/21/2009   BULIMIA, HX OF 04/21/2009   Past Medical History  Diagnosis Date   Allergy    Anemia    Anxiety    Arthritis    Asthma    Depression    GERD (gastroesophageal reflux disease)    Hypertension    Past Surgical History  Procedure Laterality Date   Lap nissen w/paraesophageal hernia repari  05-21-2010   Carpal tunnel release     Cesarean section  1982   C-sections  1983   Tonsilectomy, adenoidectomy, bilateral myringotomy and tubes     Corrective eye surgery     Hernia repair  04/2012    hiatial hernia   Allergies  Allergen Reactions   Penicillins    Prior to Admission medications     Medication Sig Start Date End Date Taking? Authorizing Provider  albuterol (VENTOLIN HFA) 108 (90 BASE) MCG/ACT inhaler Inhale 2 puffs into the lungs every 4 (four) hours as needed for wheezing. 12/09/12  Yes Judi Saa, DO  albuterol (VENTOLIN HFA) 108 (90 BASE) MCG/ACT inhaler INHALE TWO PUFFS BY MOUTH EVERY 6 HOURS AS NEEDED FOR WHEEZING. NO MORE REFILLS WITHOUT OFFICE VISIT! 12/28/13  Yes Godfrey Pick, PA-C  ALPRAZolam Prudy Feeler) 0.5 MG tablet Take 0.5 mg by mouth at bedtime as needed.   Yes Historical Provider, MD  buPROPion (WELLBUTRIN) 100 MG tablet Take 100 mg by mouth daily.    Yes Historical Provider, MD  clonazePAM (KLONOPIN) 0.5 MG tablet Take 0.5 mg by mouth at bedtime as needed.   Yes Historical Provider, MD  Flaxseed, Linseed, (FLAXSEED OIL) 1000 MG CAPS Take 1,000 mg by mouth daily.   Yes Historical Provider, MD  Fluticasone-Salmeterol (ADVAIR DISKUS) 250-50 MCG/DOSE AEPB INHALE ONE PUFF BY MOUTH TWICE DAILY 12/09/12  Yes Judi Saa, DO  HYDROcodone-acetaminophen (LORTAB) 7.5-500 MG per tablet Take 1 tablet by mouth every 6 (six) hours as needed.   Yes Historical Provider, MD  HYDROcodone-acetaminophen (NORCO) 10-325 MG per tablet Take 1 tablet by mouth every 6 (six) hours as needed.   Yes Historical Provider, MD  lamoTRIgine (LAMICTAL) 100 MG tablet Take 100 mg by mouth 2 (two) times daily.    Yes Historical Provider, MD  lisinopril (PRINIVIL,ZESTRIL) 10  MG tablet Take 1 tablet (10 mg total) by mouth daily. Need office visit for additional refills. 2nd notice. 07/15/13  Yes Chelle S Jeffery, PA-C  montelukast (SINGULAIR) 10 MG tablet Take 1 tablet by mouth daily. PATIENT NEEDS OFFICE VISIT FOR ADDITIONAL REFILLS 06/30/13  Yes Eleanore E Debbra Riding, PA-C  NAPROXEN PO Take 220 mg by mouth daily.   Yes Historical Provider, MD  ranitidine (ZANTAC) 150 MG tablet Take 1 tablet (150 mg total) by mouth 2 (two) times daily. PATIENT NEEDS OFFICE VISIT FOR ADDITIONAL REFILLS 07/20/13 07/20/14  Yes Eleanore Delia Chimes, PA-C  Simethicone (GAS-X PO) Take 125 mg by mouth as needed.   Yes Historical Provider, MD  omeprazole (PRILOSEC) 20 MG capsule Take 1 capsule (20 mg total) by mouth daily. 04/21/12 04/21/13  Collene Gobble, MD   History   Social History   Marital Status: Divorced    Spouse Name: N/A    Number of Children: N/A   Years of Education: N/A   Occupational History   Not on file.   Social History Main Topics   Smoking status: Former Smoker    Types: Cigarettes   Smokeless tobacco: Former Neurosurgeon    Quit date: 12/09/1998   Alcohol Use: 0.0 oz/week     Comment: social   Drug Use: No   Sexual Activity: Not on file   Other Topics Concern   Not on file   Social History Narrative   No narrative on file    Review of Systems  Constitutional: Negative for fever and chills.  HENT: Positive for congestion, hearing loss and rhinorrhea.   Respiratory: Positive for cough.   Musculoskeletal: Positive for back pain.       Objective:   Physical Exam  Nursing note and vitals reviewed. Constitutional: She appears well-developed and well-nourished.  HENT:  Head: Atraumatic.  Left TM with healed perforation. Brown meniscal fluid in right TM.   Cardiovascular: Normal rate, regular rhythm and normal heart sounds.   No murmur heard. Pulmonary/Chest: She has wheezes.  Musculoskeletal: Normal range of motion.  Mild thoracic scoliosis.     Filed Vitals:   05/07/14 1057  BP: 136/92  Pulse: 73  Temp: 97.3 F (36.3 C)  TempSrc: Oral  Resp: 19  Height: 5' 3.5" (1.613 m)  Weight: 216 lb 6.4 oz (98.158 kg)  SpO2: 95%        Assessment & Plan:   1. Nonsuppurative otitis media, not specified as acute or chronic   2. Idiopathic scoliosis   3. Thoracic back pain, unspecified back pain laterality   4. COPD exacerbation      I personally performed the services described in this documentation, which was scribed in my presence. The recorded information has been  reviewed and is accurate.

## 2014-05-07 NOTE — Patient Instructions (Signed)
Otitis Media With Effusion Otitis media with effusion is the presence of fluid in the middle ear. This is a common problem in children, which often follows ear infections. It may be present for weeks or longer after the infection. Unlike an acute ear infection, otitis media with effusion refers only to fluid behind the ear drum and not infection. Children with repeated ear and sinus infections and allergy problems are the most likely to get otitis media with effusion. CAUSES  The most frequent cause of the fluid buildup is dysfunction of the eustachian tubes. These are the tubes that drain fluid in the ears to the back of the nose (nasopharynx). SYMPTOMS   The main symptom of this condition is hearing loss. As a result, you or your child may:  Listen to the TV at a loud volume.  Not respond to questions.  Ask "what" often when spoken to.  Mistake or confuse one sound or word for another.  There may be a sensation of fullness or pressure but usually not pain. DIAGNOSIS   Your health care provider will diagnose this condition by examining you or your child's ears.  Your health care provider may test the pressure in you or your child's ear with a tympanometer.  A hearing test may be conducted if the problem persists. TREATMENT   Treatment depends on the duration and the effects of the effusion.  Antibiotics, decongestants, nose drops, and cortisone-type drugs (tablets or nasal spray) may not be helpful.  Children with persistent ear effusions may have delayed language or behavioral problems. Children at risk for developmental delays in hearing, learning, and speech may require referral to a specialist earlier than children not at risk.  You or your child's health care provider may suggest a referral to an ear, nose, and throat surgeon for treatment. The following may help restore normal hearing:  Drainage of fluid.  Placement of ear tubes (tympanostomy tubes).  Removal of adenoids  (adenoidectomy). HOME CARE INSTRUCTIONS   Avoid secondhand smoke.  Infants who are breastfed are less likely to have this condition.  Avoid feeding infants while they are lying flat.  Avoid known environmental allergens.  Avoid people who are sick. SEEK MEDICAL CARE IF:   Hearing is not better in 3 months.  Hearing is worse.  Ear pain.  Drainage from the ear.  Dizziness. MAKE SURE YOU:   Understand these instructions.  Will watch your condition.  Will get help right away if you are not doing well or get worse. Document Released: 09/19/2004 Document Revised: 12/27/2013 Document Reviewed: 03/09/2013 [facility] Patient Information 2015 [place], [place]. This information is not intended to replace advice given to you by your health care provider. Make sure you discuss any questions you have with your health care provider. Asthma Asthma is a recurring condition in which the airways tighten and narrow. Asthma can make it difficult to breathe. It can cause coughing, wheezing, and shortness of breath. Asthma episodes, also called asthma attacks, range from minor to life-threatening. Asthma cannot be cured, but medicines and lifestyle changes can help control it. CAUSES Asthma is believed to be caused by inherited (genetic) and environmental factors, but its exact cause is unknown. Asthma may be triggered by allergens, lung infections, or irritants in the air. Asthma triggers are different for each person. Common triggers include:   Animal dander.  Dust mites.  Cockroaches.  Pollen from trees or grass.  Mold.  Smoke.  Air pollutants such as dust, household cleaners, hair sprays, aerosol  sprays, paint fumes, strong chemicals, or strong odors.  Cold air, weather changes, and winds (which increase molds and pollens in the air).  Strong emotional expressions such as crying or laughing hard.  Stress.  Certain medicines (such as aspirin) or types of drugs (such as  beta-blockers).  Sulfites in foods and drinks. Foods and drinks that may contain sulfites include dried fruit, potato chips, and sparkling grape juice.  Infections or inflammatory conditions such as the flu, a cold, or an inflammation of the nasal membranes (rhinitis).  Gastroesophageal reflux disease (GERD).  Exercise or strenuous activity. SYMPTOMS Symptoms may occur immediately after asthma is triggered or many hours later. Symptoms include:  Wheezing.  Excessive nighttime or early morning coughing.  Frequent or severe coughing with a common cold.  Chest tightness.  Shortness of breath. DIAGNOSIS  The diagnosis of asthma is made by a review of your medical history and a physical exam. Tests may also be performed. These may include:  Lung function studies. These tests show how much air you breathe in and out.  Allergy tests.  Imaging tests such as X-rays. TREATMENT  Asthma cannot be cured, but it can usually be controlled. Treatment involves identifying and avoiding your asthma triggers. It also involves medicines. There are 2 classes of medicine used for asthma treatment:   Controller medicines. These prevent asthma symptoms from occurring. They are usually taken every day.  Reliever or rescue medicines. These quickly relieve asthma symptoms. They are used as needed and provide short-term relief. Your health care provider will help you create an asthma action plan. An asthma action plan is a written plan for managing and treating your asthma attacks. It includes a list of your asthma triggers and how they may be avoided. It also includes information on when medicines should be taken and when their dosage should be changed. An action plan may also involve the use of a device called a peak flow meter. A peak flow meter measures how well the lungs are working. It helps you monitor your condition. HOME CARE INSTRUCTIONS   Take medicines only as directed by your health care  provider. Speak with your health care provider if you have questions about how or when to take the medicines.  Use a peak flow meter as directed by your health care provider. Record and keep track of readings.  Understand and use the action plan to help minimize or stop an asthma attack without needing to seek medical care.  Control your home environment in the following ways to help prevent asthma attacks:  Do not smoke. Avoid being exposed to secondhand smoke.  Change your heating and air conditioning filter regularly.  Limit your use of fireplaces and wood stoves.  Get rid of pests (such as roaches and mice) and their droppings.  Throw away plants if you see mold on them.  Clean your floors and dust regularly. Use unscented cleaning products.  Try to have someone else vacuum for you regularly. Stay out of rooms while they are being vacuumed and for a short while afterward. If you vacuum, use a dust mask from a hardware store, a double-layered or microfilter vacuum cleaner bag, or a vacuum cleaner with a HEPA filter.  Replace carpet with wood, tile, or vinyl flooring. Carpet can trap dander and dust.  Use allergy-proof pillows, mattress covers, and box spring covers.  Wash bed sheets and blankets every week in hot water and dry them in a dryer.  Use blankets that are made  of polyester or cotton.  Clean bathrooms and kitchens with bleach. If possible, have someone repaint the walls in these rooms with mold-resistant paint. Keep out of the rooms that are being cleaned and painted.  Wash hands frequently. SEEK MEDICAL CARE IF:   You have wheezing, shortness of breath, or a cough even if taking medicine to prevent attacks.  The colored mucus you cough up (sputum) is thicker than usual.  Your sputum changes from clear or white to yellow, green, gray, or bloody.  You have any problems that may be related to the medicines you are taking (such as a rash, itching, swelling, or  trouble breathing).  You are using a reliever medicine more than 2-3 times per week.  Your peak flow is still at 50-79% of your personal best after following your action plan for 1 hour.  You have a fever. SEEK IMMEDIATE MEDICAL CARE IF:   You seem to be getting worse and are unresponsive to treatment during an asthma attack.  You are short of breath even at rest.  You get short of breath when doing very little physical activity.  You have difficulty eating, drinking, or talking due to asthma symptoms.  You develop chest pain.  You develop a fast heartbeat.  You have a bluish color to your lips or fingernails.  You are light-headed, dizzy, or faint.  Your peak flow is less than 50% of your personal best. MAKE SURE YOU:   Understand these instructions.  Will watch your condition.  Will get help right away if you are not doing well or get worse. Document Released: 08/12/2005 Document Revised: 12/27/2013 Document Reviewed: 03/11/2013 [facility] Patient Information 2015 [place], [place]. This information is not intended to replace advice given to you by your health care provider. Make sure you discuss any questions you have with your health care provider.

## 2014-05-12 ENCOUNTER — Other Ambulatory Visit: Payer: Self-pay | Admitting: Family Medicine

## 2014-05-13 NOTE — Telephone Encounter (Signed)
Patient called and stated she was recently treated by our office for both of her ears being infected. Per patient the right ear is still bothering her. Patient finished the medication yesterday and feels she needs another round of antibiotics. Please call into [facility] in [place] . Patients call back number [phone]

## 2014-05-16 ENCOUNTER — Other Ambulatory Visit: Payer: Self-pay | Admitting: *Deleted

## 2014-05-16 MED ORDER — ALBUTEROL SULFATE HFA 108 (90 BASE) MCG/ACT IN AERS: 2.0000 | INHALATION_SPRAY | RESPIRATORY_TRACT | Status: DC | PRN

## 2014-05-16 NOTE — Telephone Encounter (Signed)
Received fax request for Ventolin [facility]- sent 05/14/14 resent- pharmacy did not receive.

## 2014-07-06 ENCOUNTER — Other Ambulatory Visit: Payer: Self-pay | Admitting: Orthopedic Surgery

## 2014-07-06 DIAGNOSIS — M79604 Pain in right leg: Secondary | ICD-10-CM

## 2014-07-06 DIAGNOSIS — M1611 Unilateral primary osteoarthritis, right hip: Secondary | ICD-10-CM

## 2014-07-12 ENCOUNTER — Other Ambulatory Visit: Payer: 59

## 2014-07-18 ENCOUNTER — Other Ambulatory Visit: Payer: 59

## 2014-12-15 ENCOUNTER — Ambulatory Visit (INDEPENDENT_AMBULATORY_CARE_PROVIDER_SITE_OTHER): Payer: 59 | Admitting: Family Medicine

## 2014-12-15 ENCOUNTER — Encounter: Payer: Self-pay | Admitting: Family Medicine

## 2014-12-15 VITALS — BP 135/86 | HR 87 | Temp 98.1°F | Resp 16 | Ht 64.0 in | Wt 207.8 lb

## 2014-12-15 DIAGNOSIS — K219 Gastro-esophageal reflux disease without esophagitis: Secondary | ICD-10-CM

## 2014-12-15 DIAGNOSIS — J4520 Mild intermittent asthma, uncomplicated: Secondary | ICD-10-CM | POA: Diagnosis not present

## 2014-12-15 DIAGNOSIS — IMO0001 Reserved for inherently not codable concepts without codable children: Secondary | ICD-10-CM

## 2014-12-15 DIAGNOSIS — I10 Essential (primary) hypertension: Secondary | ICD-10-CM

## 2014-12-15 MED ORDER — DEXLANSOPRAZOLE 30 MG PO CPDR: 30.0000 mg | DELAYED_RELEASE_CAPSULE | Freq: Every day | ORAL | Status: DC

## 2014-12-15 MED ORDER — MONTELUKAST SODIUM 10 MG PO TABS: ORAL_TABLET | ORAL | Status: DC

## 2014-12-15 MED ORDER — LISINOPRIL 10 MG PO TABS: 10.0000 mg | ORAL_TABLET | Freq: Every day | ORAL | Status: DC

## 2014-12-15 MED ORDER — RANITIDINE HCL 150 MG PO TABS: 150.0000 mg | ORAL_TABLET | Freq: Two times a day (BID) | ORAL | Status: DC

## 2014-12-15 NOTE — Patient Instructions (Signed)
   Continue Singulair and claritin for allergies and asthma.  Albuterol if needed, but if you need albuterol more than twice per week or any nighttime symptoms.   I will refer you to gastroenterologist.  Continue Dexilant and zantac for now, avoid foods that can make reflux worse:  Food Choices for Gastroesophageal Reflux Disease When you have gastroesophageal reflux disease (GERD), the foods you eat and your eating habits are very important. Choosing the right foods can help ease the discomfort of GERD. WHAT GENERAL GUIDELINES DO I NEED TO FOLLOW?  Choose fruits, vegetables, whole grains, low-fat dairy products, and low-fat meat, fish, and poultry.  Limit fats such as oils, salad dressings, butter, nuts, and avocado.  Keep a food diary to identify foods that cause symptoms.  Avoid foods that cause reflux. These may be different for different people.  Eat frequent small meals instead of three large meals each day.  Eat your meals slowly, in a relaxed setting.  Limit fried foods.  Cook foods using methods other than frying.  Avoid drinking alcohol.  Avoid drinking large amounts of liquids with your meals.  Avoid bending over or lying down until 2-3 hours after eating. WHAT FOODS ARE NOT RECOMMENDED? The following are some foods and drinks that may worsen your symptoms: Vegetables Tomatoes. Tomato juice. Tomato and spaghetti sauce. Chili peppers. Onion and garlic. Horseradish. Fruits Oranges, grapefruit, and lemon (fruit and juice). Meats High-fat meats, fish, and poultry. This includes hot dogs, ribs, ham, sausage, salami, and bacon. Dairy Whole milk and chocolate milk. Sour cream. Cream. Butter. Ice cream. Cream cheese.  Beverages Coffee and tea, with or without caffeine. Carbonated beverages or energy drinks. Condiments Hot sauce. Barbecue sauce.  Sweets/Desserts Chocolate and cocoa. Donuts. Peppermint and spearmint. Fats and Oils High-fat foods, including [place]  fries and potato chips. Other Vinegar. Strong spices, such as black pepper, white pepper, red pepper, cayenne, curry powder, cloves, ginger, and chili powder. The items listed above may not be a complete list of foods and beverages to avoid. Contact your dietitian for more information. Document Released: 08/12/2005 Document Revised: 08/17/2013 Document Reviewed: 06/16/2013 [facility] Patient Information 2015 [place], [place]. This information is not intended to replace advice given to you by your health care provider. Make sure you discuss any questions you have with your health care provider.

## 2014-12-15 NOTE — Progress Notes (Signed)
Subjective:    Patient ID: Cheryl Hoover, female    DOB: October 17, 1955, 59 y.o.   MRN: [id number]  This chart was scribed for Shade Flood, MD by Ronney Lion, ED Scribe. This patient was seen in room 23 and the patient's care was started at 1:42 PM.   HPI Chief Complaint  Patient presents with  . Gastrophageal Reflux  . Medication Refill    HPI Comments: Cheryl Hoover is a 59 y.o. female.  Chart review: Last seen here by Dr. Milus Glazier in September 2015 for an acute illness. Prior to that, seen in 2014 by Dr. Terrilee Files for HTN, asthma, and GERD  1.) Asthma 2.) Hypertension - Last renal function in our system was in 2013, normal with creatinine 0.77. 3.) GERD  Patient originally came to the [facility] for leg pain, but she states that has resolved with an ortho referral.  Asthma: Patient reports no problems with asthma at this time. She takes singulair on a regular basis, and needs a refill for this. She has an Advair puffer that she does not need to use. She also has an albuterol rescue inhaler, that she uses rarely and which she last used 1 month ago.   Patient has been using Claritin for allergies, which has been controlling any seasonal allergies.   Hypertension: Patient uses lisinopril 10 mg a day. She would like a refill, but has not run out of it quite yet. Patient has been using it regularly, with no missed doses.   Patient has not yet eaten today, although she had sips of coffee with cream and sugar throughout the day.   GERD: Patient had a nissen fundoplication done by Dr. Karie Soda 3 years ago and had a paraesophageal hiatal hernia. She had a recheck before 2013 and was told that hernia had returned. She now is having breakthrough GERD symptoms, and endorses associated weight loss. Patient takes Prilosec and Zantac every day, but is having continuing GERD symptoms. She had last been to a gastroenterologist, Dr. Jeani Hawking, a couple years  ago when she had that hernia recheck. She states Dexilant has been covered by insurance, and feels this may work better.   Patient states she had recently lost her job and subsequently her insurance.    Patient Active Problem List   Diagnosis Date Noted  . Sliding hiatal hernia, recurrent 09/16/2011  . ANXIETY DEPRESSION 04/21/2009  . ASTHMA, MILD 04/21/2009  . GERD 04/21/2009  . CONSTIPATION 04/21/2009  . SCOLIOSIS 04/21/2009  . EARLY SATIETY 04/21/2009  . ABDOMINAL PAIN, EPIGASTRIC 04/21/2009  . ANOREXIA NERVOSA, HX OF 04/21/2009  . BULIMIA, HX OF 04/21/2009   Past Medical History  Diagnosis Date  . Allergy   . Anemia   . Anxiety   . Arthritis   . Asthma   . Depression   . GERD (gastroesophageal reflux disease)   . Hypertension    Past Surgical History  Procedure Laterality Date  . Lap nissen w/paraesophageal hernia repari  05-21-2010  . Carpal tunnel release    . Cesarean section  1982  . C-sections  1983  . Tonsilectomy, adenoidectomy, bilateral myringotomy and tubes    . Corrective eye surgery    . Hernia repair  04/2012    hiatial hernia   Allergies  Allergen Reactions  . Penicillins    Prior to Admission medications   Medication Sig Start Date End Date Taking? Authorizing Provider  albuterol (VENTOLIN HFA) 108 (90 BASE)  MCG/ACT inhaler Inhale 2 puffs into the lungs every 4 (four) hours as needed for wheezing. 05/16/14  Yes [name], MD  ALPRAZolam Prudy Feeler) 0.5 MG tablet Take 0.5 mg by mouth at bedtime as needed.   Yes Historical Provider, MD  bismuth subsalicylate (PEPTO BISMOL) 262 MG/15ML suspension Take 30 mLs by mouth every 6 (six) hours as needed.   Yes Historical Provider, MD  buPROPion (WELLBUTRIN) 100 MG tablet Take 100 mg by mouth daily.    Yes Historical Provider, MD  Calcium Carbonate Antacid (TUMS CHEWY DELIGHTS PO) Take by mouth.   Yes Historical Provider, MD  clonazePAM (KLONOPIN) 0.5 MG tablet Take 0.5 mg by mouth at bedtime as needed.   Yes  Historical Provider, MD  Fluticasone-Salmeterol (ADVAIR DISKUS) 250-50 MCG/DOSE AEPB INHALE ONE PUFF BY MOUTH TWICE DAILY 12/09/12  Yes Judi Saa, DO  HYDROcodone-acetaminophen (NORCO) 10-325 MG per tablet Take 1 tablet by mouth every 6 (six) hours as needed.   Yes Historical Provider, MD  lamoTRIgine (LAMICTAL) 100 MG tablet Take 100 mg by mouth 2 (two) times daily.    Yes Historical Provider, MD  lisinopril (PRINIVIL,ZESTRIL) 10 MG tablet Take 1 tablet (10 mg total) by mouth daily. Need office visit for additional refills. 2nd notice. 07/15/13  Yes Chelle S Jeffery, PA-C  loratadine (CLARITIN) 10 MG tablet Take 10 mg by mouth daily.   Yes Historical Provider, MD  montelukast (SINGULAIR) 10 MG tablet Take 1 tablet by mouth daily. PATIENT NEEDS OFFICE VISIT FOR ADDITIONAL REFILLS 06/30/13  Yes Eleanore E Egan, PA-C  naproxen (NAPROSYN) 500 MG tablet Take 500 mg by mouth 2 (two) times daily with a meal.   Yes Historical Provider, MD  OVER THE COUNTER MEDICATION flexatril joint health   Yes Historical Provider, MD  rOPINIRole (REQUIP) 0.5 MG tablet Take 0.5 mg by mouth 2 (two) times daily.   Yes Historical Provider, MD  topiramate (TOPAMAX) 100 MG tablet Take 100 mg by mouth 2 (two) times daily.   Yes Historical Provider, MD  desvenlafaxine (PRISTIQ) 50 MG 24 hr tablet Take 50 mg by mouth daily.    Historical Provider, MD  omeprazole (PRILOSEC) 20 MG capsule Take 1 capsule (20 mg total) by mouth daily. 04/21/12 04/21/13  Collene Gobble, MD  ranitidine (ZANTAC) 150 MG tablet Take 1 tablet (150 mg total) by mouth 2 (two) times daily. PATIENT NEEDS OFFICE VISIT FOR ADDITIONAL REFILLS 07/20/13 07/20/14  Godfrey Pick, PA-C   History   Social History  . Marital Status: Divorced    Spouse Name: N/A  . Number of Children: N/A  . Years of Education: N/A   Occupational History  . Not on file.   Social History Main Topics  . Smoking status: Former Smoker    Types: Cigarettes  . Smokeless tobacco:  Former Neurosurgeon    Quit date: 12/09/1998  . Alcohol Use: 0.0 oz/week     Comment: social  . Drug Use: No  . Sexual Activity: Not on file   Other Topics Concern  . Not on file   Social History Narrative      Review of Systems  Constitutional: Positive for unexpected weight change (from GERD symptoms).  Gastrointestinal:       Positive for heartburn from GERD       Objective:   Physical Exam  Constitutional: She is oriented to person, place, and time. She appears well-developed and well-nourished.  HENT:  Head: Normocephalic and atraumatic.  Eyes: Conjunctivae and EOM are normal.  Pupils are equal, round, and reactive to light.  Neck: Carotid bruit is not present.  Cardiovascular: Normal rate, regular rhythm, normal heart sounds and intact distal pulses.   Pulmonary/Chest: Effort normal and breath sounds normal.  Abdominal: Soft. She exhibits no pulsatile midline mass. There is no tenderness.  Musculoskeletal: She exhibits no edema (no leg swelling).  Neurological: She is alert and oriented to person, place, and time.  Skin: Skin is warm and dry.  Psychiatric: She has a normal mood and affect. Her behavior is normal.  Nursing note and vitals reviewed.   Filed Vitals:   12/15/14 1329  BP: 135/86  Pulse: 87  Temp: 98.1 F (36.7 C)  TempSrc: Oral  Resp: 16  Height:  (1.626 m)  Weight: 207 lb 12.8 oz (94.257 kg)  SpO2: 97%         Assessment & Plan:   Cheryl Hoover is a 59 y.o. female Reflux - Plan: ranitidine (ZANTAC) 150 MG tablet, Ambulatory referral to Gastroenterology, Dexlansoprazole 30 MG capsule  -trial of dexilant in place of prilosec, trigger avoidance, but will also refer back to GI for further assessment.   Asthma, extrinsic, mild intermittent, uncomplicated - Plan: montelukast (SINGULAIR) 10 MG tablet  -stable, controlled. Refilled singulair.  Has albuterol if needed, rtc precautions.    Essential hypertension - Plan: lisinopril  (PRINIVIL,ZESTRIL) 10 MG tablet, Lipid panel, COMPLETE METABOLIC PANEL WITH GFR  - stable. Refilled lisinopril, cmp and lipids pending.   Meds ordered this encounter  Medications  . naproxen (NAPROSYN) 500 MG tablet    Sig: Take 500 mg by mouth 2 (two) times daily with a meal.  . loratadine (CLARITIN) 10 MG tablet    Sig: Take 10 mg by mouth daily.  Marland Kitchen OVER THE COUNTER MEDICATION    Sig: flexatril joint health  . Calcium Carbonate Antacid (TUMS CHEWY DELIGHTS PO)    Sig: Take by mouth.  . bismuth subsalicylate (PEPTO BISMOL) 262 MG/15ML suspension    Sig: Take 30 mLs by mouth every 6 (six) hours as needed.  Marland Kitchen lisinopril (PRINIVIL,ZESTRIL) 10 MG tablet    Sig: Take 1 tablet (10 mg total) by mouth daily.    Dispense:  90 tablet    Refill:  1  . montelukast (SINGULAIR) 10 MG tablet    Sig: Take 1 tablet by mouth daily.    Dispense:  90 tablet    Refill:  1  . ranitidine (ZANTAC) 150 MG tablet    Sig: Take 1 tablet (150 mg total) by mouth 2 (two) times daily.    Dispense:  60 tablet    Refill:  1  . Dexlansoprazole 30 MG capsule    Sig: Take 1 capsule (30 mg total) by mouth daily.    Dispense:  30 capsule    Refill:  1   Patient Instructions    Continue Singulair and claritin for allergies and asthma.  Albuterol if needed, but if you need albuterol more than twice per week or any nighttime symptoms.   I will refer you to gastroenterologist.  Continue Dexilant and zantac for now, avoid foods that can make reflux worse:  Food Choices for Gastroesophageal Reflux Disease When you have gastroesophageal reflux disease (GERD), the foods you eat and your eating habits are very important. Choosing the right foods can help ease the discomfort of GERD. WHAT GENERAL GUIDELINES DO I NEED TO FOLLOW?  Choose fruits, vegetables, whole grains, low-fat dairy products, and low-fat meat, fish, and poultry.  Limit  fats such as oils, salad dressings, butter, nuts, and avocado.  Keep a food diary  to identify foods that cause symptoms.  Avoid foods that cause reflux. These may be different for different people.  Eat frequent small meals instead of three large meals each day.  Eat your meals slowly, in a relaxed setting.  Limit fried foods.  Cook foods using methods other than frying.  Avoid drinking alcohol.  Avoid drinking large amounts of liquids with your meals.  Avoid bending over or lying down until 2-3 hours after eating. WHAT FOODS ARE NOT RECOMMENDED? The following are some foods and drinks that may worsen your symptoms: Vegetables Tomatoes. Tomato juice. Tomato and spaghetti sauce. Chili peppers. Onion and garlic. Horseradish. Fruits Oranges, grapefruit, and lemon (fruit and juice). Meats High-fat meats, fish, and poultry. This includes hot dogs, ribs, ham, sausage, salami, and bacon. Dairy Whole milk and chocolate milk. Sour cream. Cream. Butter. Ice cream. Cream cheese.  Beverages Coffee and tea, with or without caffeine. Carbonated beverages or energy drinks. Condiments Hot sauce. Barbecue sauce.  Sweets/Desserts Chocolate and cocoa. Donuts. Peppermint and spearmint. Fats and Oils High-fat foods, including Jamaica[place] fries and potato chips. Other Vinegar. Strong spices, such as black pepper, white pepper, red pepper, cayenne, curry powder, cloves, ginger, and chili powder. The items listed above may not be a complete list of foods and beverages to avoid. Contact your dietitian for more information. Document Released: 08/12/2005 Document Revised: 08/17/2013 Document Reviewed: 06/16/2013 [facility] Patient Information 2015 [employer] [place]. This information is not intended to replace advice given to you by your health care provider. Make sure you discuss any questions you have with your health care provider.     I personally performed the services described in this documentation, which was scribed in my presence. The recorded information has been reviewed and  considered, and addended by me as needed.

## 2014-12-16 LAB — COMPLETE METABOLIC PANEL WITH GFR
ALBUMIN: 4 g/dL (ref 3.5–5.2)
ALK PHOS: 72 U/L (ref 39–117)
ALT: 10 U/L (ref 0–35)
AST: 13 U/L (ref 0–37)
BILIRUBIN TOTAL: 0.6 mg/dL (ref 0.2–1.2)
BUN: 24 mg/dL — ABNORMAL HIGH (ref 6–23)
CO2: 22 mEq/L (ref 19–32)
Calcium: 9.5 mg/dL (ref 8.4–10.5)
Chloride: 110 mEq/L (ref 96–112)
Creat: 1.05 mg/dL (ref 0.50–1.10)
GFR, Est African American: 68 mL/min
GFR, Est Non African American: 59 mL/min — ABNORMAL LOW
Glucose, Bld: 83 mg/dL (ref 70–99)
POTASSIUM: 4.6 meq/L (ref 3.5–5.3)
SODIUM: 141 meq/L (ref 135–145)
Total Protein: 6.1 g/dL (ref 6.0–8.3)

## 2014-12-16 LAB — LIPID PANEL
CHOL/HDL RATIO: 4.2 ratio
CHOLESTEROL: 165 mg/dL (ref 0–200)
HDL: 39 mg/dL — ABNORMAL LOW (ref >=46)
LDL Cholesterol: 77 mg/dL (ref 0–99)
Triglycerides: 246 mg/dL — ABNORMAL HIGH (ref <150)
VLDL: 49 mg/dL — ABNORMAL HIGH (ref 0–40)

## 2015-01-09 ENCOUNTER — Ambulatory Visit (INDEPENDENT_AMBULATORY_CARE_PROVIDER_SITE_OTHER): Payer: 59 | Admitting: Internal Medicine

## 2015-01-09 ENCOUNTER — Ambulatory Visit (INDEPENDENT_AMBULATORY_CARE_PROVIDER_SITE_OTHER): Payer: 59

## 2015-01-09 VITALS — BP 116/72 | HR 75 | Temp 98.6°F | Resp 18 | Ht 65.0 in | Wt 205.0 lb

## 2015-01-09 DIAGNOSIS — M7989 Other specified soft tissue disorders: Secondary | ICD-10-CM

## 2015-01-09 NOTE — Patient Instructions (Signed)
Please await scheduling for right leg pain.

## 2015-01-09 NOTE — Progress Notes (Signed)
[facility] [address], [place] [id number] [phone]  Date:  01/09/2015   Name:  Cheryl Hoover   DOB:  Jul 11, 1956   MRN:  [id number]  PCP:  Lucilla EdinAUB, STEVE A, MD    Chief Complaint: Mass   History of Present Illness:  Cheryl Hoover is a 59 y.o. very pleasant female patient who presents with the following:  Complaints of a mass at her left leg that has been present for one month.  This painless mass started to increase in size and is painful for 3 months ago.  Pain is above the right knee and radiates down the leg at times and around to the lateral malleolus.  She states that this is followed by her pcp.  She was referred to orthosurgery, but apparently her insurance has changed and refuse payment for this ortho visit.  There is no numbness or tingling.  She reports no fever, sob, or cough.  Pain is aggravated by use, and worsens by evening.  No movement pronounces the pain.  She applies heat which helps.  She has hip arthritis with hip replacement due, but she can not due to cost.       Patient Active Problem List   Diagnosis Date Noted  . Sliding hiatal hernia, recurrent 09/16/2011  . ANXIETY DEPRESSION 04/21/2009  . ASTHMA, MILD 04/21/2009  . GERD 04/21/2009  . CONSTIPATION 04/21/2009  . SCOLIOSIS 04/21/2009  . EARLY SATIETY 04/21/2009  . ABDOMINAL PAIN, EPIGASTRIC 04/21/2009  . ANOREXIA NERVOSA, HX OF 04/21/2009  . BULIMIA, HX OF 04/21/2009    Past Medical History  Diagnosis Date  . Allergy   . Anemia   . Anxiety   . Arthritis   . Asthma   . Depression   . GERD (gastroesophageal reflux disease)   . Hypertension     Past Surgical History  Procedure Laterality Date  . Lap nissen w/paraesophageal hernia repari  05-21-2010  . Carpal tunnel release    . Cesarean section  1982  . C-sections  1983  . Tonsilectomy, adenoidectomy, bilateral myringotomy and tubes    . Corrective eye surgery    . Hernia repair  04/2012    hiatial hernia     History  Substance Use Topics  . Smoking status: Former Smoker    Types: Cigarettes  . Smokeless tobacco: Former NeurosurgeonUser    Quit date: 12/09/1998  . Alcohol Use: 0.0 oz/week     Comment: social    Family History  Problem Relation Age of Onset  . Hypertension Mother   . Anxiety disorder Mother   . Parkinson's disease Father   . Anxiety disorder Father     Allergies  Allergen Reactions  . Penicillins     Medication list has been reviewed and updated.  Current Outpatient Prescriptions on File Prior to Visit  Medication Sig Dispense Refill  . albuterol (VENTOLIN HFA) 108 (90 BASE) MCG/ACT inhaler Inhale 2 puffs into the lungs every 4 (four) hours as needed for wheezing. 18 g 6  . ALPRAZolam (XANAX) 0.5 MG tablet Take 0.5 mg by mouth at bedtime as needed.    . clonazePAM (KLONOPIN) 0.5 MG tablet Take 0.5 mg by mouth at bedtime as needed.    . Fluticasone-Salmeterol (ADVAIR DISKUS) 250-50 MCG/DOSE AEPB INHALE ONE PUFF BY MOUTH TWICE DAILY 60 each 3  . HYDROcodone-acetaminophen (NORCO) 10-325 MG per tablet Take 1 tablet by mouth every 6 (six) hours as needed.    . lamoTRIgine (  LAMICTAL) 100 MG tablet Take 100 mg by mouth 2 (two) times daily.     Marland Kitchen. lisinopril (PRINIVIL,ZESTRIL) 10 MG tablet Take 1 tablet (10 mg total) by mouth daily. 90 tablet 1  . montelukast (SINGULAIR) 10 MG tablet Take 1 tablet by mouth daily. 90 tablet 1  . naproxen (NAPROSYN) 500 MG tablet Take 500 mg by mouth 2 (two) times daily with a meal.    . omeprazole (PRILOSEC) 20 MG capsule Take 1 capsule (20 mg total) by mouth daily. 90 capsule 0  . ranitidine (ZANTAC) 150 MG tablet Take 1 tablet (150 mg total) by mouth 2 (two) times daily. 60 tablet 1  . rOPINIRole (REQUIP) 0.5 MG tablet Take 0.5 mg by mouth 2 (two) times daily.    Marland Kitchen. topiramate (TOPAMAX) 100 MG tablet Take 100 mg by mouth 2 (two) times daily.    Marland Kitchen. bismuth subsalicylate (PEPTO BISMOL) 262 MG/15ML suspension Take 30 mLs by mouth every 6 (six) hours  as needed.    Marland Kitchen. buPROPion (WELLBUTRIN) 100 MG tablet Take 100 mg by mouth daily.     . Calcium Carbonate Antacid (TUMS CHEWY DELIGHTS PO) Take by mouth.    . desvenlafaxine (PRISTIQ) 50 MG 24 hr tablet Take 50 mg by mouth daily.    Marland Kitchen. Dexlansoprazole 30 MG capsule Take 1 capsule (30 mg total) by mouth daily. (Patient not taking: Reported on 01/09/2015) 30 capsule 1  . OVER THE COUNTER MEDICATION flexatril joint health    . [DISCONTINUED] Omeprazole (PRILOSEC PO) Take 20.6 mg by mouth daily.     No current facility-administered medications on file prior to visit.    Review of Systems: ROS otherwise unremarkable unless listed above.   Physical Examination: Filed Vitals:   01/09/15 1425  BP: 116/72  Pulse: 75  Temp: 98.6 F (37 C)  Resp: 18   Filed Vitals:   01/09/15 1425  Height: 5\' 5"  (1.651 m)  Weight: 205 lb (92.987 kg)   Body mass index is 34.11 kg/(m^2). Ideal Body Weight: Weight in (lb) to have BMI = 25: 149.9 Physical Exam  Constitutional: She is oriented to person, place, and time. She appears well-developed and well-nourished. No distress.  HENT:  Head: Normocephalic and atraumatic.  Mouth/Throat: No oropharyngeal exudate.  Eyes: EOM are normal. Pupils are equal, round, and reactive to light. Right eye exhibits no discharge. Left eye exhibits no discharge.  Pulmonary/Chest: Effort normal.  Musculoskeletal:  Right knee with lateral swelling just superior to knee.  This is minimal with prominence with knee flexion.  Mild tenderness and palpable masses, though this is likely vascular.  This is more prominent in this location (lateral thigh).  Normal strength and ROM.  Mild crepitus at the patellar without pain.  Normal patellar and DP pulses.    Neurological: She is alert and oriented to person, place, and time.  Skin: Skin is warm and dry.  Psychiatric: She has a normal mood and affect. Her behavior is normal.   [facility] reading (PRIMARY) by  Dr. Perrin MalteseGuest: Right hip arthritis.   Mild soft tissue swelling at the femur, just superior to knee.  Assessment and Plan:  59 year old female is here today for chief complaint of leg swelling.   -CT femur, pending result, may refer to ortho surgery.    Right leg swelling - Plan: DG FEMUR, MIN 2 VIEWS RIGHT, CT Femur Right Wo Contrast  Trena PlattStephanie English, PA-C [facility] 5/17/20165:04 PM

## 2015-01-27 ENCOUNTER — Telehealth: Payer: Self-pay

## 2015-01-27 NOTE — Telephone Encounter (Signed)
Pt saw Dr. Perrin MalteseGuest 01/09/15 and was referred for a CT scan for calcium deposits in her leg. Pt states that she will have to pay for the CT scan herself and wants to know if there is an alternate route that is less expensive.

## 2015-02-01 ENCOUNTER — Encounter: Payer: Self-pay | Admitting: *Deleted

## 2015-02-02 ENCOUNTER — Telehealth: Payer: Self-pay

## 2015-02-02 NOTE — Telephone Encounter (Signed)
Left message for pt to call back  °

## 2015-02-02 NOTE — Telephone Encounter (Signed)
lmom to cb. 

## 2015-02-02 NOTE — Telephone Encounter (Signed)
Patient states that she called two weeks ago asking for advice on how she can treat her leg pain at home. I saw no documentation of the message and patient is experiencing a lot of pain. Please call! [phone]

## 2015-03-10 ENCOUNTER — Ambulatory Visit (HOSPITAL_COMMUNITY)
Admission: RE | Admit: 2015-03-10 | Discharge: 2015-03-10 | Disposition: A | Discharge status: Home / Self Care | Payer: 59 | Source: Ambulatory Visit | Attending: Family Medicine | Admitting: Family Medicine

## 2015-03-10 ENCOUNTER — Other Ambulatory Visit (HOSPITAL_COMMUNITY): Payer: Self-pay | Admitting: Family Medicine

## 2015-03-10 DIAGNOSIS — M25561 Pain in right knee: Secondary | ICD-10-CM

## 2015-03-10 DIAGNOSIS — M79651 Pain in right thigh: Secondary | ICD-10-CM

## 2015-04-16 ENCOUNTER — Other Ambulatory Visit: Payer: Self-pay | Admitting: Family Medicine

## 2015-04-26 ENCOUNTER — Encounter: Payer: Self-pay | Admitting: Neurology

## 2015-04-26 ENCOUNTER — Ambulatory Visit (INDEPENDENT_AMBULATORY_CARE_PROVIDER_SITE_OTHER): Payer: 59 | Admitting: Neurology

## 2015-04-26 VITALS — BP 126/108 | HR 101 | Resp 16 | Ht 63.0 in | Wt 199.0 lb

## 2015-04-26 DIAGNOSIS — M5417 Radiculopathy, lumbosacral region: Secondary | ICD-10-CM | POA: Diagnosis not present

## 2015-04-26 DIAGNOSIS — M48061 Spinal stenosis, lumbar region without neurogenic claudication: Secondary | ICD-10-CM | POA: Insufficient documentation

## 2015-04-26 DIAGNOSIS — M4806 Spinal stenosis, lumbar region: Secondary | ICD-10-CM

## 2015-04-26 NOTE — Progress Notes (Signed)
Note routed

## 2015-04-26 NOTE — Patient Instructions (Signed)
Your leg pain is most likely coming from nerve impingement in your back.  Please follow-up with Dr. Ethelene Hal for pain management strategies.  If you would like to start physical therapy, call my office and we can arrange it. Return to clinic as needed.

## 2015-04-26 NOTE — Progress Notes (Signed)
[facility] Neurology Division Clinic Note - Initial Visit   Date: 04/26/2015  [name] MRN: [id number] DOB: [birth year]   Dear Dr. Sherwood Gambler:  Thank you for your kind referral of Cheryl Hoover for consultation of right leg pain. Although her history is well known to you, please allow [place] to reiterate it for the purpose of our medical record. The patient was accompanied to the clinic by her who also provides collateral information.     History of Present Illness: Cheryl Hoover is a 59 y.o. left-handed Caucasian female with asthma, hypertension, GERD, depression, bipolar disorder type II, and restless leg syndrome presenting for evaluation of right leg pain.    Starting in December 2015, she developed right thigh pain, described as sharp and stabbing which initially started in her lateral thigh and it has slowly progressed to involver her lower leg and buttocks.  Pain is intermittent and worse after prolonged standing and in the evening.  Heat application improves her pain.  She also takes hydrocodone for her back, which is prescribed by Dr. Ethelene Hal.  She has been recommended to do surgery, but is not interested.   She has occasional tingling of the right foot.    She has noticed a small lump over her right distal thigh and previously was told it was a lipoma.  Out-side paper records, electronic medical record, and images have been reviewed where available and summarized as:  MRI lumbar spine wo contrast 01/26/2011: 1. Lumbar rotatory scoliosis, exaggerated lumbar lordosis, and grade 1 anterolisthesis of L4 on L5. 2. Subsequent moderate and severe widespread spinal degenerative changes. No acute osseous abnormality identified. 3. Multifactorial spinal and lateral recess stenosis: - Mild on the left at L1-L2. - Mild at L2-L3 centrally and on the right. - Moderate right lateral recess stenosis at L3-L4. - Severe L4-L5 right lateral recess stenosis and mild spinal  stenosis. - Mild to moderate L5-S1 right lateral recess stenosis (in part due to synovial cyst). 4. Multifactorial neural foraminal stenosis is moderate or severe at the right L2, right L3, and right L4 nerve levels.  Past Medical History  Diagnosis Date  . Allergy   . Anemia   . Anxiety   . Arthritis   . Asthma   . Depression   . GERD (gastroesophageal reflux disease)   . Hypertension     Past Surgical History  Procedure Laterality Date  . Lap nissen w/paraesophageal hernia repari  05-21-2010  . Carpal tunnel release    . Cesarean section  1982  . C-sections  1983  . Tonsilectomy, adenoidectomy, bilateral myringotomy and tubes    . Corrective eye surgery    . Hernia repair  04/2012    hiatial hernia     Medications:  Outpatient Encounter Prescriptions as of 04/26/2015  Medication Sig  . albuterol (VENTOLIN HFA) 108 (90 BASE) MCG/ACT inhaler Inhale 2 puffs into the lungs every 4 (four) hours as needed for wheezing.  Marland Kitchen ALPRAZolam (XANAX) 0.5 MG tablet Take 0.5 mg by mouth at bedtime as needed.  . clonazePAM (KLONOPIN) 0.5 MG tablet Take 0.5 mg by mouth at bedtime as needed.  . Fluticasone-Salmeterol (ADVAIR DISKUS) 250-50 MCG/DOSE AEPB INHALE ONE PUFF BY MOUTH TWICE DAILY  . HYDROcodone-acetaminophen (NORCO) 10-325 MG per tablet Take 1 tablet by mouth every 6 (six) hours as needed.  . lamoTRIgine (LAMICTAL) 100 MG tablet Take 100 mg by mouth 2 (two) times daily.   Marland Kitchen lisinopril (PRINIVIL,ZESTRIL) 20 MG tablet Take 20 mg by  mouth daily.  Marland Kitchen loratadine (CLARITIN) 10 MG tablet Take 10 mg by mouth daily.  . montelukast (SINGULAIR) 10 MG tablet Take 1 tablet by mouth daily.  . naproxen (NAPROSYN) 500 MG tablet Take 500 mg by mouth 2 (two) times daily with a meal.  . omeprazole (PRILOSEC) 20 MG capsule Take 1 capsule (20 mg total) by mouth daily.  . ranitidine (ZANTAC) 150 MG tablet TAKE ONE TABLET BY MOUTH TWICE DAILY.  Marland Kitchen rOPINIRole (REQUIP) 0.5 MG tablet Take 2 tablets by mouth  at 5 pm & 1 tablet at bedtime  . topiramate (TOPAMAX) 100 MG tablet Take 100 mg by mouth 2 (two) times daily.  . [DISCONTINUED] desvenlafaxine (PRISTIQ) 50 MG 24 hr tablet Take 50 mg by mouth daily.  Marland Kitchen bismuth subsalicylate (PEPTO BISMOL) 262 MG/15ML suspension Take 30 mLs by mouth every 6 (six) hours as needed.  Marland Kitchen buPROPion (WELLBUTRIN) 100 MG tablet Take 100 mg by mouth daily.   . Calcium Carbonate Antacid (TUMS CHEWY DELIGHTS PO) Take by mouth.  . fluticasone (FLONASE) 50 MCG/ACT nasal spray Place 1 spray into both nostrils daily.  . [DISCONTINUED] Dexlansoprazole 30 MG capsule Take 1 capsule (30 mg total) by mouth daily. (Patient not taking: Reported on 01/09/2015)  . [DISCONTINUED] fexofenadine (ALLEGRA) 180 MG tablet Take 180 mg by mouth daily.  . [DISCONTINUED] lisinopril (PRINIVIL,ZESTRIL) 10 MG tablet Take 1 tablet (10 mg total) by mouth daily.  . [DISCONTINUED] OVER THE COUNTER MEDICATION flexatril joint health   No facility-administered encounter medications on file as of 04/26/2015.     Allergies:  Allergies  Allergen Reactions  . Penicillins     Family History: Family History  Problem Relation Age of Onset  . Hypertension Mother   . Anxiety disorder Mother   . Parkinson's disease Father   . Anxiety disorder Father     Social History: Social History  Substance Use Topics  . Smoking status: Former Smoker    Types: Cigarettes  . Smokeless tobacco: Former Neurosurgeon    Quit date: 12/09/1998  . Alcohol Use: 0.0 oz/week     Comment: social   Social History   Social History Narrative    Review of Systems:  CONSTITUTIONAL: No fevers, chills, night sweats, or weight loss.   EYES: No visual changes or eye pain ENT: No hearing changes.  No history of nose bleeds.   RESPIRATORY: No cough, wheezing and shortness of breath.   CARDIOVASCULAR: Negative for chest pain, and palpitations.   GI: Negative for abdominal discomfort, blood in stools or black stools.  No recent change  in bowel habits.   GU:  No history of incontinence.   MUSCLOSKELETAL: No history of joint pain or swelling.  No myalgias.   SKIN: Negative for lesions, rash, and itching.   HEMATOLOGY/ONCOLOGY: Negative for prolonged bleeding, bruising easily, and swollen nodes.  No history of cancer.   ENDOCRINE: Negative for cold or heat intolerance, polydipsia or goiter.   PSYCH:  +depression or anxiety symptoms.   NEURO: As Above.   Vital Signs:  BP 126/108 mmHg  Pulse 101  Resp 16  Ht  (1.6 m)  Wt 199 lb (90.266 kg)  BMI 35.26 kg/m2  SpO2 95%   General Medical Exam:   General:  Well appearing, comfortable.   Eyes/ENT: see cranial nerve examination.   Neck: No masses appreciated.  Full range of motion without tenderness.  No carotid bruits. Respiratory:  Clear to auscultation, good air entry bilaterally.   Cardiac:  Regular  rate and rhythm, no murmur.   Extremities:  Hammer toe deformity of the second toe bilaterally. Skin:  No rashes or lesions.  Neurological Exam: MENTAL STATUS including orientation to time, place, person, recent and remote memory, attention span and concentration, language, and fund of knowledge is normal.  Speech is not dysarthric.  CRANIAL NERVES: II:  No visual field defects.  Unremarkable fundi.   III-IV-VI: Pupils equal round and reactive to light.  Normal conjugate, extra-ocular eye movements in all directions of gaze.  No nystagmus.  No ptosis.   V:  Normal facial sensation.    VII:  Normal facial symmetry and movements.  No pathologic facial reflexes.  VIII:  Normal hearing and vestibular function.   IX-X:  Normal palatal movement.   XI:  Normal shoulder shrug and head rotation.   XII:  Normal tongue strength and range of motion, no deviation or fasciculation.  MOTOR:  No atrophy, fasciculations or abnormal movements.  No pronator drift.  Tone is normal.    Right Upper Extremity:    Left Upper Extremity:    Deltoid  5/5   Deltoid  5/5   Biceps  5/5    Biceps  5/5   Triceps  5/5   Triceps  5/5   Wrist extensors  5/5   Wrist extensors  5/5   Wrist flexors  5/5   Wrist flexors  5/5   Finger extensors  5/5   Finger extensors  5/5   Finger flexors  5/5   Finger flexors  5/5   Dorsal interossei  5/5   Dorsal interossei  5/5   Abductor pollicis  5/5   Abductor pollicis  5/5   Tone (Ashworth scale)  0  Tone (Ashworth scale)  0   Right Lower Extremity:    Left Lower Extremity:    Hip flexors  5/5   Hip flexors  5/5   Hip extensors  5/5   Hip extensors  5/5   Knee flexors  5/5   Knee flexors  5/5   Knee extensors  5/5   Knee extensors  5/5   Dorsiflexors  5/5   Dorsiflexors  5/5   Plantarflexors  5/5   Plantarflexors  5/5   Toe extensors  5/5   Toe extensors  5/5   Toe flexors  5/5   Toe flexors  5/5   Tone (Ashworth scale)  0  Tone (Ashworth scale)  0   MSRs:  Right                                                                 Left brachioradialis 2+  brachioradialis 2+  biceps 2+  biceps 2+  triceps 2+  triceps 2+  patellar 2+  patellar 2+  ankle jerk 2+  ankle jerk 2+  Hoffman no  Hoffman no  plantar response down  plantar response down   SENSORY:  Normal and symmetric perception of light touch, pinprick, vibration, and proprioception.  Romberg's sign absent.   COORDINATION/GAIT: Normal finger-to- nose-finger.  Intact rapid alternating movements bilaterally.  Gait mildly wide-based, poor arm swing bilaterally.   IMPRESSION: Ms. Stuckey is a 59 year-old female presenting for evaluation of right leg radicular pain.  Her exam is non-focal.  Her history and imaging  findings are most consistent with lumbar radiculopathy and neurogenic claudication.  I personally reviewed her MRI lumbar spine from 2012 which showed marked lumbar scoliosis, degenerative changes of the disc and spine with associated severe foraminal stenosis at L4-5 and L5-S1, worse on the right. She is already seeing Dr. Ethelene Hal for pain management and I encouraged her to  follow-up with him for her pain.  She may want to consider doing epidural steroid injections.  I offered physical therapy, but due to cost, she did not want to proceed with this.  I would be happy to perform NCS/EMG as needed, but with the classic nature of her symptoms and imaging findings, I do not feel that it would add more to her care.    Return to clinic as needed   The duration of this appointment visit was 45 minutes of face-to-face time with the patient.  Greater than 50% of this time was spent in counseling, explanation of diagnosis, planning of further management, and coordination of care.   Thank you for allowing me to participate in patient's care.  If I can answer any additional questions, I would be pleased to do so.    Sincerely,     K. Allena Katz, DO

## 2015-05-30 ENCOUNTER — Encounter: Payer: Self-pay | Admitting: Emergency Medicine

## 2017-05-23 NOTE — H&P (Signed)
TOTAL HIP ADMISSION H&P  Patient is admitted for right total hip arthroplasty, anterior approach.  Subjective:  Chief Complaint:      Right hip primary OA / pain  HPI: Cheryl Hoover, 61 y.o. female, has a history of pain and functional disability in the right hip(s) due to arthritis and patient has failed non-surgical conservative treatments for greater than 12 weeks to include NSAID's and/or analgesics, corticosteriod injections, use of assistive devices and activity modification.  Onset of symptoms was gradual starting 4+ years ago with gradually worsening course since that time.The patient noted no past surgery on the right hip(s).  Patient currently rates pain in the right hip at 9 out of 10 with activity. Patient has night pain, worsening of pain with activity and weight bearing, trendelenberg gait, pain that interfers with activities of daily living and pain with passive range of motion. Patient has evidence of periarticular osteophytes and joint space narrowing by imaging studies. This condition presents safety issues increasing the risk of falls.  There is no current active infection.  Risks, benefits and expectations were discussed with the patient.  Risks including but not limited to the risk of anesthesia, blood clots, nerve damage, blood vessel damage, failure of the prosthesis, infection and up to and including death.  Patient understand the risks, benefits and expectations and wishes to proceed with surgery.   PCP: Collene Gobble, MD  D/C Plans:       Home   Post-op Meds:       No Rx given   Tranexamic Acid:      To be given - IV  Decadron:      Is to be given  FYI:     ASA  Norco  DME:    Rx given for - RW and 3-n-1  PT:     No PT   Patient Active Problem List   Diagnosis Date Noted  . Lumbosacral radiculopathy 04/26/2015  . Degenerative lumbar spinal stenosis 04/26/2015  . Sliding hiatal hernia, recurrent 09/16/2011  . ANXIETY DEPRESSION 04/21/2009  . ASTHMA, MILD  04/21/2009  . GERD 04/21/2009  . CONSTIPATION 04/21/2009  . SCOLIOSIS 04/21/2009  . EARLY SATIETY 04/21/2009  . ABDOMINAL PAIN, EPIGASTRIC 04/21/2009  . ANOREXIA NERVOSA, HX OF 04/21/2009  . BULIMIA, HX OF 04/21/2009   Past Medical History:  Diagnosis Date  . Allergy   . Anemia   . Anxiety   . Arthritis   . Asthma   . Bipolar disorder   . Depression   . GERD (gastroesophageal reflux disease)   . Hypertension     Past Surgical History:  Procedure Laterality Date  . c-sections  26  . CARPAL TUNNEL RELEASE    . CESAREAN SECTION  1982  . corrective eye surgery    . HERNIA REPAIR  04/2012   hiatial hernia  . lap nissen w/paraesophageal hernia repari  05-21-2010  . TONSILECTOMY, ADENOIDECTOMY, BILATERAL MYRINGOTOMY AND TUBES      No prescriptions prior to admission.   Allergies  Allergen Reactions  . Penicillins     Social History  Substance Use Topics  . Smoking status: Former Smoker    Types: Cigarettes  . Smokeless tobacco: Never Used  . Alcohol use 0.0 oz/week     Comment: rare    Family History  Problem Relation Age of Onset  . Hypertension Mother        Deceased  . Anxiety disorder Mother   . Parkinson's disease Father  Deceased  . Anxiety disorder Father   . Anxiety disorder Brother   . Anxiety disorder Son   . Diabetes Son        Deceased, 95  . Healthy Son      Review of Systems  Constitutional: Negative.   HENT: Negative.   Eyes: Negative.   Respiratory: Negative.   Cardiovascular: Negative.   Gastrointestinal: Positive for constipation and heartburn.  Genitourinary: Negative.   Musculoskeletal: Positive for joint pain.  Skin: Negative.   Neurological: Negative.   Endo/Heme/Allergies: Negative.   Psychiatric/Behavioral: Positive for depression. The patient is nervous/anxious.     Objective:  Physical Exam  Constitutional: She is oriented to person, place, and time. She appears well-developed.  HENT:  Head: Normocephalic.   Eyes: Pupils are equal, round, and reactive to light.  Neck: Neck supple. No JVD present. No tracheal deviation present. No thyromegaly present.  Cardiovascular: Normal rate, regular rhythm and intact distal pulses.   Respiratory: Effort normal and breath sounds normal. No respiratory distress. She has no wheezes.  GI: Soft. There is no tenderness. There is no guarding.  Musculoskeletal:       Right hip: She exhibits decreased range of motion, decreased strength, tenderness and bony tenderness. She exhibits no swelling, no deformity and no laceration.  Lymphadenopathy:    She has no cervical adenopathy.  Neurological: She is alert and oriented to person, place, and time. A sensory deficit (neuropathy bilateral hands) is present.  Skin: Skin is warm and dry.  Psychiatric: She has a normal mood and affect.      Labs:  Estimated body mass index is 35.25 kg/m as calculated from the following:   Height as of 04/26/15:  (1.6 m).   Weight as of 04/26/15: 90.3 kg (199 lb).   Imaging Review Plain radiographs demonstrate severe degenerative joint disease of the right hip(s). The bone quality appears to be good for age and reported activity level.  Assessment/Plan:  End stage arthritis, right hip(s)  The patient history, physical examination, clinical judgement of the provider and imaging studies are consistent with end stage degenerative joint disease of the right hip(s) and total hip arthroplasty is deemed medically necessary. The treatment options including medical management, injection therapy, arthroscopy and arthroplasty were discussed at length. The risks and benefits of total hip arthroplasty were presented and reviewed. The risks due to aseptic loosening, infection, stiffness, dislocation/subluxation,  thromboembolic complications and other imponderables were discussed.  The patient acknowledged the explanation, agreed to proceed with the plan and consent was signed. Patient is  being admitted for inpatient treatment for surgery, pain control, PT, OT, prophylactic antibiotics, VTE prophylaxis, progressive ambulation and ADL's and discharge planning.The patient is planning to be discharged home.       Anastasio Auerbach    PA-C  05/23/2017, 9:33 AM

## 2017-05-26 ENCOUNTER — Encounter (HOSPITAL_COMMUNITY): Payer: Self-pay

## 2017-05-26 NOTE — Patient Instructions (Signed)
Cheryl Hoover  05/26/2017   Your procedure is scheduled on: 06/03/2017    Report to [facility] Main  Entrance   Report to admitting at  0600 AM Follow signs to Short Stay on first floor at    0600AM  Call this number if you have problems the morning of surgery  [phone]   Remember: ONLY 1 PERSON MAY GO WITH YOU TO SHORT STAY TO GET  READY MORNING OF YOUR SURGERY.  Do not eat food or drink liquids :After Midnight.     Take these medicines the morning of surgery with A SIP OF WATER: use inhalers as usual and bringm, Lamictal, topamax , prilosec or zantac, claritin                                 You may not have any metal on your body including hair pins and              piercings  Do not wear jewelry, make-up, lotions, powders or perfumes, deodorant             Do not wear nail polish.  Do not shave  48 hours prior to surgery.                 Do not bring valuables to the hospital. [place] IS NOT             RESPONSIBLE   FOR VALUABLES.  Contacts, dentures or bridgework may not be worn into surgery.  Leave suitcase in the car. After surgery it may be brought to your room.                   Please read over the following fact sheets you were given: _____________________________________________________________________             [id number] - Preparing for Surgery Before surgery, you can play an important role.  Because skin is not sterile, your skin needs to be as free of germs as possible.  You can reduce the number of germs on your skin by washing with CHG (chlorahexidine gluconate) soap before surgery.  CHG is an antiseptic cleaner which kills germs and bonds with the skin to continue killing germs even after washing. Please DO NOT use if you have an allergy to CHG or antibacterial soaps.  If your skin becomes reddened/irritated stop using the CHG and inform your nurse when you arrive at Short Stay. Do not shave (including legs and  underarms) for at least 48 hours prior to the first CHG shower.  You may shave your face/neck. Please follow these instructions carefully:  1.  Shower with CHG Soap the night before surgery and the  morning of Surgery.  2.  If you choose to wash your hair, wash your hair first as usual with your  normal  shampoo.  3.  After you shampoo, rinse your hair and body thoroughly to remove the  shampoo.                           4.  Use CHG as you would any other liquid soap.  You can apply chg directly  to the skin and wash  Gently with a scrungie or clean washcloth.  5.  Apply the CHG Soap to your body ONLY FROM THE NECK DOWN.   Do not use on face/ open                           Wound or open sores. Avoid contact with eyes, ears mouth and genitals (private parts).                       Wash face,  Genitals (private parts) with your normal soap.             6.  Wash thoroughly, paying special attention to the area where your surgery  will be performed.  7.  Thoroughly rinse your body with warm water from the neck down.  8.  DO NOT shower/wash with your normal soap after using and rinsing off  the CHG Soap.                9.  Pat yourself dry with a clean towel.            10.  Wear clean pajamas.            11.  Place clean sheets on your bed the night of your first shower and do not  sleep with pets. Day of Surgery : Do not apply any lotions/deodorants the morning of surgery.  Please wear clean clothes to the hospital/surgery center.  FAILURE TO FOLLOW THESE INSTRUCTIONS MAY RESULT IN THE CANCELLATION OF YOUR SURGERY PATIENT SIGNATURE_________________________________  NURSE SIGNATURE__________________________________  ________________________________________________________________________  WHAT IS A BLOOD TRANSFUSION? Blood Transfusion Information  A transfusion is the replacement of blood or some of its parts. Blood is made up of multiple cells which provide different  functions.  Red blood cells carry oxygen and are used for blood loss replacement.  White blood cells fight against infection.  Platelets control bleeding.  Plasma helps clot blood.  Other blood products are available for specialized needs, such as hemophilia or other clotting disorders. BEFORE THE TRANSFUSION  Who gives blood for transfusions?   Healthy volunteers who are fully evaluated to make sure their blood is safe. This is blood bank blood. Transfusion therapy is the safest it has ever been in the practice of medicine. Before blood is taken from a donor, a complete history is taken to make sure that person has no history of diseases nor engages in risky social behavior (examples are intravenous drug use or sexual activity with multiple partners). The donor's travel history is screened to minimize risk of transmitting infections, such as malaria. The donated blood is tested for signs of infectious diseases, such as HIV and hepatitis. The blood is then tested to be sure it is compatible with you in order to minimize the chance of a transfusion reaction. If you or a relative donates blood, this is often done in anticipation of surgery and is not appropriate for emergency situations. It takes many days to process the donated blood. RISKS AND COMPLICATIONS Although transfusion therapy is very safe and saves many lives, the main dangers of transfusion include:   Getting an infectious disease.  Developing a transfusion reaction. This is an allergic reaction to something in the blood you were given. Every precaution is taken to prevent this. The decision to have a blood transfusion has been considered carefully by your caregiver before blood is given. Blood is not given unless the benefits outweigh  the risks. AFTER THE TRANSFUSION  Right after receiving a blood transfusion, you will usually feel much better and more energetic. This is especially true if your red blood cells have gotten low  (anemic). The transfusion raises the level of the red blood cells which carry oxygen, and this usually causes an energy increase.  The nurse administering the transfusion will monitor you carefully for complications. HOME CARE INSTRUCTIONS  No special instructions are needed after a transfusion. You may find your energy is better. Speak with your caregiver about any limitations on activity for underlying diseases you may have. SEEK MEDICAL CARE IF:   Your condition is not improving after your transfusion.  You develop redness or irritation at the intravenous (IV) site. SEEK IMMEDIATE MEDICAL CARE IF:  Any of the following symptoms occur over the next 12 hours:  Shaking chills.  You have a temperature by mouth above 102 F (38.9 C), not controlled by medicine.  Chest, back, or muscle pain.  People around you feel you are not acting correctly or are confused.  Shortness of breath or difficulty breathing.  Dizziness and fainting.  You get a rash or develop hives.  You have a decrease in urine output.  Your urine turns a dark color or changes to pink, red, or brown. Any of the following symptoms occur over the next 10 days:  You have a temperature by mouth above 102 F (38.9 C), not controlled by medicine.  Shortness of breath.  Weakness after normal activity.  The white part of the eye turns yellow (jaundice).  You have a decrease in the amount of urine or are urinating less often.  Your urine turns a dark color or changes to pink, red, or brown. Document Released: 08/09/2000 Document Revised: 11/04/2011 Document Reviewed: 03/28/2008 ExitCare Patient Information 2014 [place].  _______________________________________________________________________  Incentive Spirometer  An incentive spirometer is a tool that can help keep your lungs clear and active. This tool measures how well you are filling your lungs with each breath. Taking long deep breaths may help  reverse or decrease the chance of developing breathing (pulmonary) problems (especially infection) following:  A long period of time when you are unable to move or be active. BEFORE THE PROCEDURE   If the spirometer includes an indicator to show your best effort, your nurse or respiratory therapist will set it to a desired goal.  If possible, sit up straight or lean slightly forward. Try not to slouch.  Hold the incentive spirometer in an upright position. INSTRUCTIONS FOR USE  1. Sit on the edge of your bed if possible, or sit up as far as you can in bed or on a chair. 2. Hold the incentive spirometer in an upright position. 3. Breathe out normally. 4. Place the mouthpiece in your mouth and seal your lips tightly around it. 5. Breathe in slowly and as deeply as possible, raising the piston or the ball toward the top of the column. 6. Hold your breath for 3-5 seconds or for as long as possible. Allow the piston or ball to fall to the bottom of the column. 7. Remove the mouthpiece from your mouth and breathe out normally. 8. Rest for a few seconds and repeat Steps 1 through 7 at least 10 times every 1-2 hours when you are awake. Take your time and take a few normal breaths between deep breaths. 9. The spirometer may include an indicator to show your best effort. Use the indicator as a goal to work  toward during each repetition. 10. After each set of 10 deep breaths, practice coughing to be sure your lungs are clear. If you have an incision (the cut made at the time of surgery), support your incision when coughing by placing a pillow or rolled up towels firmly against it. Once you are able to get out of bed, walk around indoors and cough well. You may stop using the incentive spirometer when instructed by your caregiver.  RISKS AND COMPLICATIONS  Take your time so you do not get dizzy or light-headed.  If you are in pain, you may need to take or ask for pain medication before doing incentive  spirometry. It is harder to take a deep breath if you are having pain. AFTER USE  Rest and breathe slowly and easily.  It can be helpful to keep track of a log of your progress. Your caregiver can provide you with a simple table to help with this. If you are using the spirometer at home, follow these instructions: [place] IF:   You are having difficultly using the spirometer.  You have trouble using the spirometer as often as instructed.  Your pain medication is not giving enough relief while using the spirometer.  You develop fever of 100.5 F (38.1 C) or higher. SEEK IMMEDIATE MEDICAL CARE IF:   You cough up bloody sputum that had not been present before.  You develop fever of 102 F (38.9 C) or greater.  You develop worsening pain at or near the incision site. MAKE SURE YOU:   Understand these instructions.  Will watch your condition.  Will get help right away if you are not doing well or get worse. Document Released: 12/23/2006 Document Revised: 11/04/2011 Document Reviewed: 02/23/2007 [facility] Patient Information 2014 [place], [place].   ________________________________________________________________________

## 2017-05-27 ENCOUNTER — Encounter (HOSPITAL_COMMUNITY)
Admission: RE | Admit: 2017-05-27 | Discharge: 2017-05-27 | Disposition: A | Discharge status: Home / Self Care | Payer: BLUE CROSS/BLUE SHIELD | Source: Ambulatory Visit | Attending: Orthopedic Surgery | Admitting: Orthopedic Surgery

## 2017-05-27 ENCOUNTER — Encounter (HOSPITAL_COMMUNITY): Payer: Self-pay

## 2017-05-27 DIAGNOSIS — Z01818 Encounter for other preprocedural examination: Secondary | ICD-10-CM | POA: Diagnosis not present

## 2017-05-27 DIAGNOSIS — Z01810 Encounter for preprocedural cardiovascular examination: Secondary | ICD-10-CM | POA: Insufficient documentation

## 2017-05-27 DIAGNOSIS — M1611 Unilateral primary osteoarthritis, right hip: Secondary | ICD-10-CM | POA: Diagnosis not present

## 2017-05-27 DIAGNOSIS — I4519 Other right bundle-branch block: Secondary | ICD-10-CM | POA: Insufficient documentation

## 2017-05-27 HISTORY — DX: Adverse effect of unspecified anesthetic, initial encounter: T41.45XA

## 2017-05-27 HISTORY — DX: Other complications of anesthesia, initial encounter: T88.59XA

## 2017-05-27 HISTORY — DX: Scoliosis, unspecified: M41.9

## 2017-05-27 LAB — BASIC METABOLIC PANEL
Anion gap: 9 (ref 5–15)
BUN: 29 mg/dL — AB (ref 6–20)
CALCIUM: 9.5 mg/dL (ref 8.9–10.3)
CHLORIDE: 110 mmol/L (ref 101–111)
CO2: 20 mmol/L — AB (ref 22–32)
Creatinine, Ser: 1.04 mg/dL — ABNORMAL HIGH (ref 0.44–1.00)
GFR calc non Af Amer: 57 mL/min — ABNORMAL LOW (ref >60)
Glucose, Bld: 114 mg/dL — ABNORMAL HIGH (ref 65–99)
POTASSIUM: 3.9 mmol/L (ref 3.5–5.1)
SODIUM: 139 mmol/L (ref 135–145)

## 2017-05-27 LAB — CBC
HCT: 39 % (ref 36.0–46.0)
Hemoglobin: 12.6 g/dL (ref 12.0–15.0)
MCH: 31.1 pg (ref 26.0–34.0)
MCHC: 32.3 g/dL (ref 30.0–36.0)
MCV: 96.3 fL (ref 78.0–100.0)
PLATELETS: 272 10*3/uL (ref 150–400)
RBC: 4.05 MIL/uL (ref 3.87–5.11)
RDW: 13.2 % (ref 11.5–15.5)
WBC: 7 10*3/uL (ref 4.0–10.5)

## 2017-05-27 LAB — SURGICAL PCR SCREEN
MRSA, PCR: NEGATIVE
Staphylococcus aureus: NEGATIVE

## 2017-05-27 LAB — ABO/RH: ABO/RH(D): B POS

## 2017-05-27 NOTE — Progress Notes (Signed)
BMp done 05/27/17 faxed via epic to dr Charlann Boxer.

## 2017-05-27 NOTE — Progress Notes (Signed)
Final EKG done 05/27/17-epic

## 2017-06-02 NOTE — Anesthesia Preprocedure Evaluation (Addendum)
Anesthesia Evaluation  Patient identified by MRN, date of birth, ID band Patient awake    Reviewed: Allergy & Precautions, H&P , NPO status , Patient's Chart, lab work & pertinent test results  Airway Mallampati: I  TM Distance: >3 FB Neck ROM: Full    Dental no notable dental hx. (+) Upper Dentures, Lower Dentures, Dental Advisory Given   Pulmonary asthma , former smoker,    Pulmonary exam normal breath sounds clear to auscultation       Cardiovascular Exercise Tolerance: Good hypertension, Pt. on medications  Rhythm:Regular Rate:Normal     Neuro/Psych Anxiety Depression Bipolar Disorder negative neurological ROS  negative psych ROS   GI/Hepatic Neg liver ROS, GERD  Medicated and Controlled,  Endo/Other  negative endocrine ROS  Renal/GU negative Renal ROS  negative genitourinary   Musculoskeletal  (+) Arthritis , Osteoarthritis,    Abdominal   Peds  Hematology negative hematology ROS (+) anemia ,   Anesthesia Other Findings   Reproductive/Obstetrics negative OB ROS                            Anesthesia Physical Anesthesia Plan  ASA: II  Anesthesia Plan: Spinal   Post-op Pain Management:    Induction: Intravenous  PONV Risk Score and Plan: 3 and Ondansetron, Dexamethasone, Midazolam and Propofol infusion  Airway Management Planned: Simple Face Mask  Additional Equipment:   Intra-op Plan:   Post-operative Plan:   Informed Consent: I have reviewed the patients History and Physical, chart, labs and discussed the procedure including the risks, benefits and alternatives for the proposed anesthesia with the patient or authorized representative who has indicated his/her understanding and acceptance.   Dental advisory given  Plan Discussed with: CRNA and Surgeon  Anesthesia Plan Comments:        Anesthesia Quick Evaluation

## 2017-06-03 ENCOUNTER — Inpatient Hospital Stay (HOSPITAL_COMMUNITY): Payer: BLUE CROSS/BLUE SHIELD | Admitting: Anesthesiology

## 2017-06-03 ENCOUNTER — Inpatient Hospital Stay (HOSPITAL_COMMUNITY)
Admission: RE | Admit: 2017-06-03 | Discharge: 2017-06-04 | DRG: 470 | Disposition: A | Discharge status: Home / Self Care | Payer: BLUE CROSS/BLUE SHIELD | Source: Ambulatory Visit | Attending: Orthopedic Surgery | Admitting: Orthopedic Surgery

## 2017-06-03 ENCOUNTER — Encounter (HOSPITAL_COMMUNITY)
Admission: RE | Disposition: A | Discharge status: Home / Self Care | Payer: Self-pay | Source: Ambulatory Visit | Attending: Orthopedic Surgery

## 2017-06-03 ENCOUNTER — Inpatient Hospital Stay (HOSPITAL_COMMUNITY): Payer: BLUE CROSS/BLUE SHIELD

## 2017-06-03 ENCOUNTER — Encounter (HOSPITAL_COMMUNITY): Payer: Self-pay

## 2017-06-03 DIAGNOSIS — M1611 Unilateral primary osteoarthritis, right hip: Secondary | ICD-10-CM | POA: Diagnosis present

## 2017-06-03 DIAGNOSIS — K5900 Constipation, unspecified: Secondary | ICD-10-CM | POA: Diagnosis present

## 2017-06-03 DIAGNOSIS — Z818 Family history of other mental and behavioral disorders: Secondary | ICD-10-CM | POA: Diagnosis not present

## 2017-06-03 DIAGNOSIS — Z820 Family history of epilepsy and other diseases of the nervous system: Secondary | ICD-10-CM

## 2017-06-03 DIAGNOSIS — F419 Anxiety disorder, unspecified: Secondary | ICD-10-CM | POA: Diagnosis present

## 2017-06-03 DIAGNOSIS — Z8249 Family history of ischemic heart disease and other diseases of the circulatory system: Secondary | ICD-10-CM | POA: Diagnosis not present

## 2017-06-03 DIAGNOSIS — I10 Essential (primary) hypertension: Secondary | ICD-10-CM | POA: Diagnosis present

## 2017-06-03 DIAGNOSIS — Z880 Allergy status to penicillin: Secondary | ICD-10-CM

## 2017-06-03 DIAGNOSIS — Z6829 Body mass index (BMI) 29.0-29.9, adult: Secondary | ICD-10-CM

## 2017-06-03 DIAGNOSIS — K219 Gastro-esophageal reflux disease without esophagitis: Secondary | ICD-10-CM | POA: Diagnosis present

## 2017-06-03 DIAGNOSIS — J45909 Unspecified asthma, uncomplicated: Secondary | ICD-10-CM | POA: Diagnosis present

## 2017-06-03 DIAGNOSIS — F329 Major depressive disorder, single episode, unspecified: Secondary | ICD-10-CM | POA: Diagnosis present

## 2017-06-03 DIAGNOSIS — E663 Overweight: Secondary | ICD-10-CM | POA: Diagnosis present

## 2017-06-03 DIAGNOSIS — Z87891 Personal history of nicotine dependence: Secondary | ICD-10-CM | POA: Diagnosis not present

## 2017-06-03 DIAGNOSIS — Z833 Family history of diabetes mellitus: Secondary | ICD-10-CM | POA: Diagnosis not present

## 2017-06-03 DIAGNOSIS — Z96649 Presence of unspecified artificial hip joint: Secondary | ICD-10-CM

## 2017-06-03 HISTORY — PX: TOTAL HIP ARTHROPLASTY: SHX124

## 2017-06-03 LAB — TYPE AND SCREEN
ABO/RH(D): B POS
Antibody Screen: NEGATIVE

## 2017-06-03 SURGERY — ARTHROPLASTY, HIP, TOTAL, ANTERIOR APPROACH
Anesthesia: Spinal | Site: Hip | Laterality: Right

## 2017-06-03 MED ORDER — ALBUTEROL SULFATE (2.5 MG/3ML) 0.083% IN NEBU
2.5000 mg | INHALATION_SOLUTION | RESPIRATORY_TRACT | Status: DC | PRN
Start: 2017-06-03 — End: 2017-06-04

## 2017-06-03 MED ORDER — MIDAZOLAM HCL 2 MG/2ML IJ SOLN
INTRAMUSCULAR | Status: AC
  Filled 2017-06-03: qty 2

## 2017-06-03 MED ORDER — ONDANSETRON HCL 4 MG/2ML IJ SOLN
INTRAMUSCULAR | Status: DC | PRN
  Administered 2017-06-03: 4 mg via INTRAVENOUS

## 2017-06-03 MED ORDER — HYDROCODONE-ACETAMINOPHEN 7.5-325 MG PO TABS
1.0000 | ORAL_TABLET | ORAL | Status: DC
  Administered 2017-06-03: 1 via ORAL
  Administered 2017-06-03: 2 via ORAL
  Administered 2017-06-03: 1 via ORAL
  Administered 2017-06-03 – 2017-06-04 (×4): 2 via ORAL
  Filled 2017-06-03 (×3): qty 2
  Filled 2017-06-03: qty 1
  Filled 2017-06-03 (×2): qty 2
  Filled 2017-06-03: qty 1

## 2017-06-03 MED ORDER — PROPOFOL 10 MG/ML IV BOLUS
INTRAVENOUS | Status: AC
  Filled 2017-06-03: qty 40

## 2017-06-03 MED ORDER — ALBUTEROL SULFATE HFA 108 (90 BASE) MCG/ACT IN AERS: 2.0000 | INHALATION_SPRAY | RESPIRATORY_TRACT | Status: DC | PRN

## 2017-06-03 MED ORDER — TRANEXAMIC ACID 1000 MG/10ML IV SOLN
1000.0000 mg | INTRAVENOUS | Status: AC
  Administered 2017-06-03: 1000 mg via INTRAVENOUS
  Filled 2017-06-03: qty 1100

## 2017-06-03 MED ORDER — BISMUTH SUBSALICYLATE 262 MG/15ML PO SUSP: 30.0000 mL | Freq: Two times a day (BID) | ORAL | Status: DC | PRN

## 2017-06-03 MED ORDER — CLONAZEPAM 1 MG PO TABS
1.0000 mg | ORAL_TABLET | Freq: Every day | ORAL | Status: DC
  Administered 2017-06-03: 1 mg via ORAL
  Filled 2017-06-03: qty 1

## 2017-06-03 MED ORDER — ROPINIROLE HCL 0.5 MG PO TABS
0.5000 mg | ORAL_TABLET | Freq: Two times a day (BID) | ORAL | Status: DC
  Administered 2017-06-03 – 2017-06-04 (×2): 0.5 mg via ORAL
  Filled 2017-06-03 (×2): qty 1

## 2017-06-03 MED ORDER — ALPRAZOLAM 0.5 MG PO TABS: 0.5000 mg | ORAL_TABLET | Freq: Every day | ORAL | Status: DC | PRN

## 2017-06-03 MED ORDER — MAGNESIUM CITRATE PO SOLN: 1.0000 | Freq: Once | ORAL | Status: DC | PRN

## 2017-06-03 MED ORDER — SODIUM CHLORIDE 0.9 % IR SOLN
Status: DC | PRN
  Administered 2017-06-03: 1000 mL

## 2017-06-03 MED ORDER — DOCUSATE SODIUM 100 MG PO CAPS: 100.0000 mg | ORAL_CAPSULE | Freq: Two times a day (BID) | ORAL | 0 refills | Status: DC

## 2017-06-03 MED ORDER — LACTATED RINGERS IV SOLN
INTRAVENOUS | Status: DC
  Administered 2017-06-03 (×2): via INTRAVENOUS
  Administered 2017-06-03: 1000 mL via INTRAVENOUS

## 2017-06-03 MED ORDER — PHENOL 1.4 % MT LIQD: 1.0000 | OROMUCOSAL | Status: DC | PRN

## 2017-06-03 MED ORDER — CHLORHEXIDINE GLUCONATE 4 % EX LIQD: 60.0000 mL | Freq: Once | CUTANEOUS | Status: DC

## 2017-06-03 MED ORDER — PROPOFOL 10 MG/ML IV BOLUS
INTRAVENOUS | Status: AC
  Filled 2017-06-03: qty 20

## 2017-06-03 MED ORDER — PHENYLEPHRINE 40 MCG/ML (10ML) SYRINGE FOR IV PUSH (FOR BLOOD PRESSURE SUPPORT)
PREFILLED_SYRINGE | INTRAVENOUS | Status: DC | PRN
  Administered 2017-06-03 (×5): 80 ug via INTRAVENOUS

## 2017-06-03 MED ORDER — DOCUSATE SODIUM 100 MG PO CAPS
100.0000 mg | ORAL_CAPSULE | Freq: Two times a day (BID) | ORAL | Status: DC
  Administered 2017-06-03 – 2017-06-04 (×2): 100 mg via ORAL
  Filled 2017-06-03 (×2): qty 1

## 2017-06-03 MED ORDER — BUPIVACAINE HCL (PF) 0.5 % IJ SOLN
INTRAMUSCULAR | Status: DC | PRN
  Administered 2017-06-03: 15 mg via INTRATHECAL

## 2017-06-03 MED ORDER — ESOMEPRAZOLE MAGNESIUM 20 MG PO CPDR
20.0000 mg | DELAYED_RELEASE_CAPSULE | Freq: Every day | ORAL | Status: DC
  Administered 2017-06-03: 20 mg via ORAL
  Filled 2017-06-03: qty 1

## 2017-06-03 MED ORDER — LORATADINE 10 MG PO TABS
10.0000 mg | ORAL_TABLET | Freq: Every day | ORAL | Status: DC
  Administered 2017-06-03 – 2017-06-04 (×2): 10 mg via ORAL
  Filled 2017-06-03 (×2): qty 1

## 2017-06-03 MED ORDER — METOCLOPRAMIDE HCL 5 MG PO TABS: 5.0000 mg | ORAL_TABLET | Freq: Three times a day (TID) | ORAL | Status: DC | PRN

## 2017-06-03 MED ORDER — METHOCARBAMOL 500 MG PO TABS
500.0000 mg | ORAL_TABLET | Freq: Four times a day (QID) | ORAL | Status: DC | PRN
  Administered 2017-06-03 – 2017-06-04 (×3): 500 mg via ORAL
  Filled 2017-06-03 (×3): qty 1

## 2017-06-03 MED ORDER — FERROUS SULFATE 325 (65 FE) MG PO TABS: 325.0000 mg | ORAL_TABLET | Freq: Three times a day (TID) | ORAL | 3 refills | Status: DC

## 2017-06-03 MED ORDER — CEFAZOLIN SODIUM-DEXTROSE 2-4 GM/100ML-% IV SOLN
INTRAVENOUS | Status: AC
  Filled 2017-06-03: qty 100

## 2017-06-03 MED ORDER — GLYCOPYRROLATE 0.2 MG/ML IJ SOLN
INTRAMUSCULAR | Status: DC | PRN
  Administered 2017-06-03: 0.4 mg via INTRAVENOUS

## 2017-06-03 MED ORDER — MENTHOL 3 MG MT LOZG: 1.0000 | LOZENGE | OROMUCOSAL | Status: DC | PRN

## 2017-06-03 MED ORDER — PHENYLEPHRINE 40 MCG/ML (10ML) SYRINGE FOR IV PUSH (FOR BLOOD PRESSURE SUPPORT)
PREFILLED_SYRINGE | INTRAVENOUS | Status: AC
  Filled 2017-06-03: qty 10

## 2017-06-03 MED ORDER — PHENYLEPHRINE HCL 10 MG/ML IJ SOLN
INTRAMUSCULAR | Status: AC
  Filled 2017-06-03: qty 1

## 2017-06-03 MED ORDER — FAMOTIDINE 20 MG PO TABS
20.0000 mg | ORAL_TABLET | Freq: Every day | ORAL | Status: DC
  Administered 2017-06-03 – 2017-06-04 (×2): 20 mg via ORAL
  Filled 2017-06-03 (×2): qty 1

## 2017-06-03 MED ORDER — ONDANSETRON HCL 4 MG/2ML IJ SOLN: 4.0000 mg | Freq: Four times a day (QID) | INTRAMUSCULAR | Status: DC | PRN

## 2017-06-03 MED ORDER — METOCLOPRAMIDE HCL 5 MG/ML IJ SOLN: 5.0000 mg | Freq: Three times a day (TID) | INTRAMUSCULAR | Status: DC | PRN

## 2017-06-03 MED ORDER — NON FORMULARY: 20.0000 mg | Freq: Every day | Status: DC

## 2017-06-03 MED ORDER — CELECOXIB 200 MG PO CAPS
200.0000 mg | ORAL_CAPSULE | Freq: Two times a day (BID) | ORAL | Status: DC
  Administered 2017-06-03 – 2017-06-04 (×2): 200 mg via ORAL
  Filled 2017-06-03 (×2): qty 1

## 2017-06-03 MED ORDER — HYDROMORPHONE HCL-NACL 0.5-0.9 MG/ML-% IV SOSY
0.2500 mg | PREFILLED_SYRINGE | INTRAVENOUS | Status: DC | PRN
  Administered 2017-06-03 (×2): 0.5 mg via INTRAVENOUS

## 2017-06-03 MED ORDER — DEXAMETHASONE SODIUM PHOSPHATE 10 MG/ML IJ SOLN
10.0000 mg | Freq: Once | INTRAMUSCULAR | Status: AC
  Administered 2017-06-04: 10 mg via INTRAVENOUS
  Filled 2017-06-03: qty 1

## 2017-06-03 MED ORDER — ALBUMIN HUMAN 5 % IV SOLN
INTRAVENOUS | Status: AC
  Administered 2017-06-03: 12.5 g via INTRAVENOUS
  Filled 2017-06-03: qty 250

## 2017-06-03 MED ORDER — BUPIVACAINE HCL (PF) 0.5 % IJ SOLN
INTRAMUSCULAR | Status: AC
  Filled 2017-06-03: qty 30

## 2017-06-03 MED ORDER — HYDROMORPHONE HCL-NACL 0.5-0.9 MG/ML-% IV SOSY: 0.2500 mg | PREFILLED_SYRINGE | INTRAVENOUS | Status: DC | PRN

## 2017-06-03 MED ORDER — HYDROCODONE-ACETAMINOPHEN 7.5-325 MG PO TABS: 1.0000 | ORAL_TABLET | ORAL | 0 refills | Status: DC | PRN

## 2017-06-03 MED ORDER — MONTELUKAST SODIUM 10 MG PO TABS
10.0000 mg | ORAL_TABLET | Freq: Every day | ORAL | Status: DC
  Administered 2017-06-03: 10 mg via ORAL
  Filled 2017-06-03: qty 1

## 2017-06-03 MED ORDER — METHOCARBAMOL 1000 MG/10ML IJ SOLN
500.0000 mg | Freq: Four times a day (QID) | INTRAVENOUS | Status: DC | PRN
  Administered 2017-06-03: 500 mg via INTRAVENOUS
  Filled 2017-06-03: qty 550

## 2017-06-03 MED ORDER — METHOCARBAMOL 500 MG PO TABS: 500.0000 mg | ORAL_TABLET | Freq: Four times a day (QID) | ORAL | 0 refills | Status: DC | PRN

## 2017-06-03 MED ORDER — ALBUMIN HUMAN 5 % IV SOLN
12.5000 g | Freq: Once | INTRAVENOUS | Status: AC
  Administered 2017-06-03: 12.5 g via INTRAVENOUS

## 2017-06-03 MED ORDER — GLYCOPYRROLATE 0.2 MG/ML IV SOSY
PREFILLED_SYRINGE | INTRAVENOUS | Status: AC
  Filled 2017-06-03: qty 5

## 2017-06-03 MED ORDER — LAMOTRIGINE 100 MG PO TABS
100.0000 mg | ORAL_TABLET | Freq: Two times a day (BID) | ORAL | Status: DC
  Administered 2017-06-03 – 2017-06-04 (×2): 100 mg via ORAL
  Filled 2017-06-03 (×2): qty 1

## 2017-06-03 MED ORDER — DEXAMETHASONE SODIUM PHOSPHATE 10 MG/ML IJ SOLN
10.0000 mg | Freq: Once | INTRAMUSCULAR | Status: AC
  Administered 2017-06-03: 10 mg via INTRAVENOUS

## 2017-06-03 MED ORDER — ALUM & MAG HYDROXIDE-SIMETH 200-200-20 MG/5ML PO SUSP: 15.0000 mL | ORAL | Status: DC | PRN

## 2017-06-03 MED ORDER — ASPIRIN 81 MG PO CHEW: 81.0000 mg | CHEWABLE_TABLET | Freq: Two times a day (BID) | ORAL | 0 refills | Status: AC

## 2017-06-03 MED ORDER — CEFAZOLIN SODIUM-DEXTROSE 2-4 GM/100ML-% IV SOLN
2.0000 g | Freq: Four times a day (QID) | INTRAVENOUS | Status: AC
  Administered 2017-06-03 (×2): 2 g via INTRAVENOUS
  Filled 2017-06-03 (×2): qty 100

## 2017-06-03 MED ORDER — POLYETHYLENE GLYCOL 3350 17 G PO PACK
17.0000 g | PACK | Freq: Two times a day (BID) | ORAL | Status: DC
  Administered 2017-06-04: 17 g via ORAL
  Filled 2017-06-03 (×2): qty 1

## 2017-06-03 MED ORDER — MIDAZOLAM HCL 5 MG/5ML IJ SOLN
INTRAMUSCULAR | Status: DC | PRN
  Administered 2017-06-03: 2 mg via INTRAVENOUS

## 2017-06-03 MED ORDER — HYDROMORPHONE HCL-NACL 0.5-0.9 MG/ML-% IV SOSY
PREFILLED_SYRINGE | INTRAVENOUS | Status: AC
  Administered 2017-06-03: 0.5 mg
  Filled 2017-06-03: qty 2

## 2017-06-03 MED ORDER — BISACODYL 10 MG RE SUPP: 10.0000 mg | Freq: Every day | RECTAL | Status: DC | PRN

## 2017-06-03 MED ORDER — SODIUM CHLORIDE 0.9 % IV SOLN
INTRAVENOUS | Status: DC
  Administered 2017-06-03: 100 mL/h via INTRAVENOUS

## 2017-06-03 MED ORDER — PROPOFOL 500 MG/50ML IV EMUL
INTRAVENOUS | Status: DC | PRN
  Administered 2017-06-03: 100 ug/kg/min via INTRAVENOUS

## 2017-06-03 MED ORDER — POLYETHYLENE GLYCOL 3350 17 G PO PACK: 17.0000 g | PACK | Freq: Two times a day (BID) | ORAL | 0 refills | Status: DC

## 2017-06-03 MED ORDER — TOPIRAMATE 100 MG PO TABS
100.0000 mg | ORAL_TABLET | Freq: Two times a day (BID) | ORAL | Status: DC
  Administered 2017-06-03 – 2017-06-04 (×2): 100 mg via ORAL
  Filled 2017-06-03 (×2): qty 1

## 2017-06-03 MED ORDER — HYDROMORPHONE HCL-NACL 0.5-0.9 MG/ML-% IV SOSY
0.5000 mg | PREFILLED_SYRINGE | INTRAVENOUS | Status: DC | PRN
  Administered 2017-06-03: 0.5 mg via INTRAVENOUS
  Administered 2017-06-03: 1 mg via INTRAVENOUS
  Filled 2017-06-03: qty 1
  Filled 2017-06-03: qty 2
  Filled 2017-06-03: qty 1

## 2017-06-03 MED ORDER — FERROUS SULFATE 325 (65 FE) MG PO TABS
325.0000 mg | ORAL_TABLET | Freq: Three times a day (TID) | ORAL | Status: DC
  Administered 2017-06-04 (×2): 325 mg via ORAL
  Filled 2017-06-03 (×2): qty 1

## 2017-06-03 MED ORDER — FENTANYL CITRATE (PF) 100 MCG/2ML IJ SOLN
INTRAMUSCULAR | Status: AC
  Filled 2017-06-03: qty 2

## 2017-06-03 MED ORDER — TRANEXAMIC ACID 1000 MG/10ML IV SOLN
1000.0000 mg | Freq: Once | INTRAVENOUS | Status: AC
  Administered 2017-06-03: 1000 mg via INTRAVENOUS
  Filled 2017-06-03: qty 1100

## 2017-06-03 MED ORDER — DIPHENHYDRAMINE HCL 25 MG PO CAPS: 25.0000 mg | ORAL_CAPSULE | Freq: Four times a day (QID) | ORAL | Status: DC | PRN

## 2017-06-03 MED ORDER — FENTANYL CITRATE (PF) 100 MCG/2ML IJ SOLN
INTRAMUSCULAR | Status: DC | PRN
  Administered 2017-06-03: 100 ug via INTRAVENOUS

## 2017-06-03 MED ORDER — PHENYLEPHRINE HCL 10 MG/ML IJ SOLN
INTRAVENOUS | Status: DC | PRN
  Administered 2017-06-03: 50 ug/min via INTRAVENOUS

## 2017-06-03 MED ORDER — CEFAZOLIN SODIUM-DEXTROSE 2-4 GM/100ML-% IV SOLN
2.0000 g | INTRAVENOUS | Status: AC
  Administered 2017-06-03: 2 g via INTRAVENOUS

## 2017-06-03 MED ORDER — ONDANSETRON HCL 4 MG PO TABS: 4.0000 mg | ORAL_TABLET | Freq: Four times a day (QID) | ORAL | Status: DC | PRN

## 2017-06-03 MED ORDER — ASPIRIN 81 MG PO CHEW
81.0000 mg | CHEWABLE_TABLET | Freq: Two times a day (BID) | ORAL | Status: DC
  Administered 2017-06-03 – 2017-06-04 (×2): 81 mg via ORAL
  Filled 2017-06-03 (×2): qty 1

## 2017-06-03 SURGICAL SUPPLY — 38 items
ADH SKN CLS APL DERMABOND .7 (GAUZE/BANDAGES/DRESSINGS) ×1
BAG DECANTER FOR FLEXI CONT (MISCELLANEOUS) IMPLANT
BAG SPEC THK2 15X12 ZIP CLS (MISCELLANEOUS)
BAG ZIPLOCK 12X15 (MISCELLANEOUS) IMPLANT
BLADE SAG 18X100X1.27 (BLADE) ×2 IMPLANT
CAPT HIP TOTAL 2 ×1 IMPLANT
CLOTH BEACON ORANGE TIMEOUT ST (SAFETY) ×2 IMPLANT
COVER PERINEAL POST (MISCELLANEOUS) ×2 IMPLANT
COVER SURGICAL LIGHT HANDLE (MISCELLANEOUS) ×2 IMPLANT
DERMABOND ADVANCED (GAUZE/BANDAGES/DRESSINGS) ×1
DERMABOND ADVANCED .7 DNX12 (GAUZE/BANDAGES/DRESSINGS) ×1 IMPLANT
DRAPE STERI IOBAN 125X83 (DRAPES) ×2 IMPLANT
DRAPE U-SHAPE 47X51 STRL (DRAPES) ×4 IMPLANT
DRESSING AQUACEL AG SP 3.5X10 (GAUZE/BANDAGES/DRESSINGS) ×1 IMPLANT
DRSG AQUACEL AG ADV 3.5X10 (GAUZE/BANDAGES/DRESSINGS) ×1 IMPLANT
DRSG AQUACEL AG SP 3.5X10 (GAUZE/BANDAGES/DRESSINGS) ×2
DURAPREP 26ML APPLICATOR (WOUND CARE) ×2 IMPLANT
ELECT REM PT RETURN 15FT ADLT (MISCELLANEOUS) ×2 IMPLANT
GLOVE BIOGEL M STRL SZ7.5 (GLOVE) IMPLANT
GLOVE BIOGEL PI IND STRL 7.5 (GLOVE) ×1 IMPLANT
GLOVE BIOGEL PI IND STRL 8.5 (GLOVE) ×1 IMPLANT
GLOVE BIOGEL PI INDICATOR 7.5 (GLOVE) ×1
GLOVE BIOGEL PI INDICATOR 8.5 (GLOVE) ×1
GLOVE ECLIPSE 8.0 STRL XLNG CF (GLOVE) ×4 IMPLANT
GLOVE ORTHO TXT STRL SZ7.5 (GLOVE) ×2 IMPLANT
GOWN STRL REUS W/TWL LRG LVL3 (GOWN DISPOSABLE) ×2 IMPLANT
GOWN STRL REUS W/TWL XL LVL3 (GOWN DISPOSABLE) ×2 IMPLANT
HOLDER FOLEY CATH W/STRAP (MISCELLANEOUS) ×2 IMPLANT
PACK ANTERIOR HIP CUSTOM (KITS) ×2 IMPLANT
SUT MNCRL AB 4-0 PS2 18 (SUTURE) ×2 IMPLANT
SUT STRATAFIX 0 PDS 27 VIOLET (SUTURE) ×2
SUT VIC AB 1 CT1 36 (SUTURE) ×6 IMPLANT
SUT VIC AB 2-0 CT1 27 (SUTURE) ×4
SUT VIC AB 2-0 CT1 TAPERPNT 27 (SUTURE) ×2 IMPLANT
SUTURE STRATFX 0 PDS 27 VIOLET (SUTURE) ×1 IMPLANT
TRAY FOLEY W/METER SILVER 16FR (SET/KITS/TRAYS/PACK) IMPLANT
WATER STERILE IRR 1500ML POUR (IV SOLUTION) ×2 IMPLANT
YANKAUER SUCT BULB TIP 10FT TU (MISCELLANEOUS) IMPLANT

## 2017-06-03 NOTE — Op Note (Signed)
NAME:  Cheryl Hoover                ACCOUNT NO.: [id number]      MEDICAL RECORD NO.: [id number]      FACILITY:  [facility]      PHYSICIAN:  Durene Romans D  DATE OF BIRTH:  1955/09/17     DATE OF PROCEDURE:  06/03/2017                                 OPERATIVE REPORT         PREOPERATIVE DIAGNOSIS: Right  hip osteoarthritis.      POSTOPERATIVE DIAGNOSIS:  Right hip osteoarthritis.      PROCEDURE:  Right total hip replacement through an anterior approach   utilizing DePuy THR system, component size 52mm pinnacle cup, a size 36+4 neutral   Altrex liner, a size 4 Hi Tri Lock stem with a 36+5 delta ceramic   ball.      SURGEON:  Madlyn Frankel. Charlann Boxer, M.D.      ASSISTANT:  Skip Mayer, PA-C     ANESTHESIA:  Spinal.      SPECIMENS:  None.      COMPLICATIONS:  None.      BLOOD LOSS:  600 cc     DRAINS:  None.      INDICATION OF THE PROCEDURE:  Cheryl Hoover is a 61 y.o. female who had   presented to office for evaluation of right hip pain.  Radiographs revealed   progressive degenerative changes with bone-on-bone   articulation to the  hip joint.  The patient had painful limited range of   motion significantly affecting their overall quality of life.  The patient was failing to    respond to conservative measures, and at this point was ready   to proceed with more definitive measures.  The patient has noted progressive   degenerative changes in his hip, progressive problems and dysfunction   with regarding the hip prior to surgery.  Consent was obtained for   benefit of pain relief.  Specific risk of infection, DVT, component   failure, dislocation, need for revision surgery, as well discussion of   the anterior versus posterior approach were reviewed.  Consent was   obtained for benefit of anterior pain relief through an anterior   approach.      PROCEDURE IN DETAIL:  The patient was brought to operative theater.   Once adequate anesthesia,  preoperative antibiotics, 2 gm of Ancef, 1 gm of Tranexamic Acid, and 10 mg of Decadron administered.   The patient was positioned supine on the OSI Hanna table.  Once adequate   padding of boney process was carried out, we had predraped out the hip, and  used fluoroscopy to confirm orientation of the pelvis and position.      The right hip was then prepped and draped from proximal iliac crest to   mid thigh with shower curtain technique.      Time-out was performed identifying the patient, planned procedure, and   extremity.     An incision was then made 2 cm distal and lateral to the   anterior superior iliac spine extending over the orientation of the   tensor fascia lata muscle and sharp dissection was carried down to the   fascia of the muscle and protractor placed in the soft tissues.      The fascia  was then incised.  The muscle belly was identified and swept   laterally and retractor placed along the superior neck.  Following   cauterization of the circumflex vessels and removing some pericapsular   fat, a second cobra retractor was placed on the inferior neck.  A third   retractor was placed on the anterior acetabulum after elevating the   anterior rectus.  A L-capsulotomy was along the line of the   superior neck to the trochanteric fossa, then extended proximally and   distally.  Tag sutures were placed and the retractors were then placed   intracapsular.  We then identified the trochanteric fossa and   orientation of my neck cut, confirmed this radiographically   and then made a neck osteotomy with the femur on traction.  The femoral   head was removed without difficulty or complication.  Traction was let   off and retractors were placed posterior and anterior around the   acetabulum.      The labrum and foveal tissue were debrided.  I began reaming with a 46mm   reamer and reamed up to 51mm reamer with good bony bed preparation and a 52mm   cup was chosen.  The final  52mm Pinnacle cup was then impacted under fluoroscopy  to confirm the depth of penetration and orientation with respect to   abduction.  A screw was placed followed by the hole eliminator.  The final   36+4 neutral Altrex liner was impacted with good visualized rim fit.  The cup was positioned anatomically within the acetabular portion of the pelvis.      At this point, the femur was rolled at 80 degrees.  Further capsule was   released off the inferior aspect of the femoral neck.  I then   released the superior capsule proximally.  The hook was placed laterally   along the femur and elevated manually and held in position with the bed   hook.  The leg was then extended and adducted with the leg rolled to 100   degrees of external rotation.  Once the proximal femur was fully   exposed, I used a box osteotome to set orientation.  I then began   broaching with the starting chili pepper broach and passed this by hand and then broached up to 4.  With the 4 broach in place I chose a high offset neck and did several trial reductions.  The offset was appropriate, leg lengths   appeared to be equal best matched with the +5 head ball confirmed radiographically, particularly given the fact that she has advanced left hip OA.   Given these findings, I went ahead and dislocated the hip, repositioned all   retractors and positioned the right hip in the extended and abducted position.  The final 4 Hi Tri Lock stem was   chosen and it was impacted down to the level of neck cut.  Based on this   and the trial reduction, a 36+5 delta ceramic ball was chosen and   impacted onto a clean and dry trunnion, and the hip was reduced.  The   hip had been irrigated throughout the case again at this point.  I did   reapproximate the superior capsular leaflet to the anterior leaflet   using #1 Vicryl.  The fascia of the   tensor fascia lata muscle was then reapproximated using #1 Vicryl and #0 Stratafix sutures.  The    remaining wound was closed with 2-0 Vicryl  and running 4-0 Monocryl.   The hip was cleaned, dried, and dressed sterilely using Dermabond and   Aquacel dressing.  She was then brought   to recovery room in stable condition tolerating the procedure well.    Skip Mayer, PA-C was present for the entirety of the case involved from   preoperative positioning, perioperative retractor management, general   facilitation of the case, as well as primary wound closure as assistant.            Madlyn Frankel Charlann Boxer, M.D.        06/03/2017 10:02 AM

## 2017-06-03 NOTE — Anesthesia Postprocedure Evaluation (Signed)
Anesthesia Post Note  Patient: Cheryl Hoover  Procedure(s) Performed: RIGHT TOTAL HIP ARTHROPLASTY ANTERIOR APPROACH (Right Hip)     Patient location during evaluation: PACU Anesthesia Type: Spinal Level of consciousness: awake and alert Pain management: pain level controlled Vital Signs Assessment: post-procedure vital signs reviewed and stable Respiratory status: spontaneous breathing and respiratory function stable Cardiovascular status: blood pressure returned to baseline and stable Postop Assessment: spinal receding and no apparent nausea or vomiting Anesthetic complications: no    Last Vitals:  Vitals:   06/03/17 1305 06/03/17 1310  BP: 128/80 116/77  Pulse: 75 71  Resp: 12 11  Temp:    SpO2: 100% 100%    Last Pain:  Vitals:   06/03/17 1310  TempSrc:   PainSc: 3     LLE Motor Response: Purposeful movement (06/03/17 1300)   RLE Motor Response: Purposeful movement (06/03/17 1300)   L Sensory Level: S5-Perianal area (06/03/17 1300) R Sensory Level: S5-Perianal area (06/03/17 1300)  ,W. EDMOND

## 2017-06-03 NOTE — Evaluation (Signed)
Physical Therapy Evaluation Patient Details Name: Cheryl Hoover MRN: [id number] DOB: 05-Feb-1956 Today's Date: 06/03/2017   History of Present Illness  61 yo female s/p R THA-direct anterior 06/03/17.   Clinical Impression  On eval POD 0, pt required Min assist for mobility. She walked ~20 feet with a RW. Pain rated 7/10 with activity. Will follow and progress activity as tolerated.     Follow Up Recommendations DC plan and follow up therapy as arranged by surgeon    Equipment Recommendations  None recommended by PT    Recommendations for Other Services       Precautions / Restrictions Precautions Precautions: Fall Restrictions Weight Bearing Restrictions: No RLE Weight Bearing: Weight bearing as tolerated      Mobility  Bed Mobility Overal bed mobility: Needs Assistance Bed Mobility: Supine to Sit;Sit to Supine     Supine to sit: Min assist;HOB elevated Sit to supine: Min assist;HOB elevated   General bed mobility comments: Asisst for R LE. Increased time. Cues for technique.   Transfers Overall transfer level: Needs assistance Equipment used: Rolling walker (2 wheeled) Transfers: Sit to/from Stand Sit to Stand: Min assist;From elevated surface         General transfer comment: Assist to rise, stabilize, control descent. VCSsafety, technique, hand/LE placement  Ambulation/Gait Ambulation/Gait assistance: Min assist Ambulation Distance (Feet): 20 Feet Assistive device: Rolling walker (2 wheeled) Gait Pattern/deviations: Step-to pattern;Step-through pattern;Decreased stride length     General Gait Details: slow gait speed. VCs safety, sequence, step length, distance from [employer] Mobility    Modified Rankin (Stroke Patients Only)       Balance                                             Pertinent Vitals/Pain Pain Assessment: 0-10 Pain Score: 7  Pain Location: R hip/LE Pain Descriptors /  Indicators: Aching;Sore Pain Intervention(s): Monitored during session;Repositioned    Home Living Family/patient expects to be discharged to:: Private residence Living Arrangements: Spouse/significant other Available Help at Discharge: Family Type of Home: House Home Access: Stairs to enter Entrance Stairs-Rails: Right Entrance Stairs-Number of Steps: 3 Home Layout: One level Home Equipment: Environmental consultant - 2 wheels;Cane - single point      Prior Function Level of Independence: Independent with assistive device(s)         Comments: using 2 canes for ambulation     Hand Dominance        Extremity/Trunk Assessment   Upper Extremity Assessment Upper Extremity Assessment: Generalized weakness    Lower Extremity Assessment Lower Extremity Assessment:  (s/p R THA)    Cervical / Trunk Assessment Cervical / Trunk Assessment: Normal  Communication   Communication: No difficulties  Cognition Arousal/Alertness: Awake/alert Behavior During Therapy: WFL for tasks assessed/performed Overall Cognitive Status: Within Functional Limits for tasks assessed                                        General Comments      Exercises     Assessment/Plan    PT Assessment Patient needs continued PT services  PT Problem List Decreased strength;Decreased mobility;Decreased range of motion;Decreased activity tolerance;Decreased balance;Decreased knowledge of use of DME;Pain  PT Treatment Interventions DME instruction;Gait training;Therapeutic activities;Therapeutic exercise;Stair training;Functional mobility training;Patient/family education;Balance training    PT Goals (Current goals can be found in the Care Plan section)  Acute Rehab PT Goals Patient Stated Goal: less pain PT Goal Formulation: With patient/family Time For Goal Achievement: 06/17/17 Potential to Achieve Goals: Good    Frequency 7X/week   Barriers to discharge        Co-evaluation                AM-PAC PT "6 Clicks" Daily Activity  Outcome Measure Difficulty turning over in bed (including adjusting bedclothes, sheets and blankets)?: Unable Difficulty moving from lying on back to sitting on the side of the bed? : Unable Difficulty sitting down on and standing up from a chair with arms (e.g., wheelchair, bedside commode, etc,.)?: Unable Help needed moving to and from a bed to chair (including a wheelchair)?: A Little Help needed walking in hospital room?: A Little Help needed climbing 3-5 steps with a railing? : A Little 6 Click Score: 12    End of Session Equipment Utilized During Treatment: Gait belt Activity Tolerance: Patient limited by fatigue;Patient limited by pain Patient left: in bed;with call bell/phone within reach;with family/visitor present   PT Visit Diagnosis: Muscle weakness (generalized) (M62.81);Difficulty in walking, not elsewhere classified (R26.2)    Time: 4098-1191 PT Time Calculation (min) (ACUTE ONLY): 22 min   Charges:   PT Evaluation $PT Eval Low Complexity: 1 Low     PT G Codes:          Rebeca Alert, MPT Pager: [phone]

## 2017-06-03 NOTE — Interval H&P Note (Signed)
History and Physical Interval Note:  06/03/2017 6:46 AM  [name]  has presented today for surgery, with the diagnosis of Right hip osteoarthritis  The various methods of treatment have been discussed with the patient and family. After consideration of risks, benefits and other options for treatment, the patient has consented to  Procedure(s) with comments: RIGHT TOTAL HIP ARTHROPLASTY ANTERIOR APPROACH (Right) - 70 mins as a surgical intervention .  The patient's history has been reviewed, patient examined, no change in status, stable for surgery.  I have reviewed the patient's chart and labs.  Questions were answered to the patient's satisfaction.     Cheryl Hoover

## 2017-06-03 NOTE — Transfer of Care (Signed)
Immediate Anesthesia Transfer of Care Note  Patient: Cheryl Hoover  Procedure(s) Performed: RIGHT TOTAL HIP ARTHROPLASTY ANTERIOR APPROACH (Right Hip)  Patient Location: PACU  Anesthesia Type:General  Level of Consciousness: sedated  Airway & Oxygen Therapy: Patient Spontanous Breathing and Patient connected to face mask oxygen  Post-op Assessment: Report given to RN and Post -op Vital signs reviewed and stable  Post vital signs: Reviewed and stable  Last Vitals:  Vitals:   06/03/17 0632  BP: (!) 141/81  Pulse: 84  Resp: 18  Temp: 36.5 C  SpO2: 99%    Last Pain:  Vitals:   06/03/17 0632  TempSrc: Oral      Patients Stated Pain Goal: 4 (06/03/17 0656)  Complications: No apparent anesthesia complications

## 2017-06-03 NOTE — Discharge Instructions (Signed)

## 2017-06-04 DIAGNOSIS — E663 Overweight: Secondary | ICD-10-CM | POA: Diagnosis present

## 2017-06-04 LAB — BASIC METABOLIC PANEL
Anion gap: 10 (ref 5–15)
BUN: 16 mg/dL (ref 6–20)
CALCIUM: 8.8 mg/dL — AB (ref 8.9–10.3)
CO2: 20 mmol/L — ABNORMAL LOW (ref 22–32)
CREATININE: 1 mg/dL (ref 0.44–1.00)
Chloride: 111 mmol/L (ref 101–111)
GFR calc Af Amer: >60 mL/min (ref >60)
GFR, EST NON AFRICAN AMERICAN: 60 mL/min — AB (ref >60)
GLUCOSE: 113 mg/dL — AB (ref 65–99)
Potassium: 4 mmol/L (ref 3.5–5.1)
Sodium: 141 mmol/L (ref 135–145)

## 2017-06-04 LAB — CBC
HCT: 29.8 % — ABNORMAL LOW (ref 36.0–46.0)
Hemoglobin: 9.8 g/dL — ABNORMAL LOW (ref 12.0–15.0)
MCH: 32.2 pg (ref 26.0–34.0)
MCHC: 32.9 g/dL (ref 30.0–36.0)
MCV: 98 fL (ref 78.0–100.0)
PLATELETS: 185 10*3/uL (ref 150–400)
RBC: 3.04 MIL/uL — ABNORMAL LOW (ref 3.87–5.11)
RDW: 13 % (ref 11.5–15.5)
WBC: 7.4 10*3/uL (ref 4.0–10.5)

## 2017-06-04 NOTE — Progress Notes (Signed)
PT Cancellation Note  Patient Details Name: Cheryl Hoover MRN: [id number] DOB: 08-Jun-1956   Cancelled Treatment:    Reason Eval/Treat Not Completed:  Attempted PT session. Pt eating lunch. Will check back as schedule allows.    Rebeca Alert, MPT Pager: [phone]

## 2017-06-04 NOTE — Progress Notes (Signed)
RN reviewed discharge instructions with patient and family. All questions answered.   Paperwork and prescriptions given.   NT rolled patient down with all belongings to family car. 

## 2017-06-04 NOTE — Progress Notes (Signed)
Physical Therapy Treatment Patient Details Name: Cheryl Hoover MRN: [id number] DOB: Sep 25, 1955 Today's Date: 06/04/2017    History of Present Illness 61 yo female s/p R THA-direct anterior 06/03/17.     PT Comments    Progressing with mobility. Reviewed exercises, gait training, and stair negotiation. Issued  HEP for pt to perform 2x/day until she follow up with MD. All education completed. Ready to d/c from PT standpoint-made RN aware.     Follow Up Recommendations  DC plan and follow up therapy as arranged by surgeon (No PT)     Equipment Recommendations  None recommended by PT    Recommendations for Other Services       Precautions / Restrictions Precautions Precautions: Fall Restrictions Weight Bearing Restrictions: No RLE Weight Bearing: Weight bearing as tolerated    Mobility  Bed Mobility Overal bed mobility: Needs Assistance Bed Mobility: Supine to Sit     Supine to sit: Supervision    General bed mobility comments: pt used sheet to assist R LE off bed  Transfers Overall transfer level: Needs assistance Equipment used: Rolling walker (2 wheeled) Transfers: Sit to/from Stand Sit to Stand: Min guard         General transfer comment: close guard for safety. VCs hand placement. Increased time.   Ambulation/Gait Ambulation/Gait assistance: Min guard Ambulation Distance (Feet): 60 Feet Assistive device: Rolling walker (2 wheeled) Gait Pattern/deviations: Step-to pattern     General Gait Details: slow gait speed. close guard for safety. VCs step length, R foot/LE positioning (straight instead of internally rotated)   Stairs Stairs: Yes   Stair Management: One rail Right;With cane;Forwards Number of Stairs: 2 General stair comments: close guard for safety. VCs safety, sequence, technique. Significant other present to observe.   Wheelchair Mobility    Modified Rankin (Stroke Patients Only)       Balance                                             Cognition Arousal/Alertness: Awake/alert Behavior During Therapy: WFL for tasks assessed/performed Overall Cognitive Status: Within Functional Limits for tasks assessed                                        Exercises Total Joint Exercises Ankle Circles/Pumps: AROM;Both;10 reps;Seated Quad Sets: AROM;Both;10 reps;Seated Heel Slides: AAROM;Right;10 reps;Supine Hip ABduction/ADduction: AROM;Right;10 reps;Supine Long Arc Quad: AROM;Right;10 reps;Seated Knee Flexion: AROM;Right;10 reps;Standing Marching in Standing: AROM;Both;10 reps;Standing General Exercises - Lower Extremity Mini-Sqauts: AROM;Both;10 reps;Standing    General Comments        Pertinent Vitals/Pain Pain Assessment: 0-10 Pain Score: 5  Pain Location: R hip/LE Pain Descriptors / Indicators: Sore Pain Intervention(s): Monitored during session;Repositioned    Home Living Family/patient expects to be discharged to:: Private residence Living Arrangements: Spouse/significant other Available Help at Discharge: Family         Home Equipment: Toilet riser;Tub bench      Prior Function Level of Independence: Independent with assistive device(s)      Comments: using 2 canes for ambulation   PT Goals (current goals can now be found in the care plan section) Acute Rehab PT Goals Patient Stated Goal: less pain Progress towards PT goals: Progressing toward goals    Frequency    7X/week  PT Plan Current plan remains appropriate    Co-evaluation              AM-PAC PT "6 Clicks" Daily Activity  Outcome Measure  Difficulty turning over in bed (including adjusting bedclothes, sheets and blankets)?: A Little Difficulty moving from lying on back to sitting on the side of the bed? : A Little Difficulty sitting down on and standing up from a chair with arms (e.g., wheelchair, bedside commode, etc,.)?: A Little Help needed moving to and from a bed to chair  (including a wheelchair)?: A Little Help needed walking in hospital room?: A Little Help needed climbing 3-5 steps with a railing? : A Little 6 Click Score: 18    End of Session Equipment Utilized During Treatment: Gait belt Activity Tolerance: Patient tolerated treatment well Patient left: in chair;with call bell/phone within reach   PT Visit Diagnosis: Muscle weakness (generalized) (M62.81);Difficulty in walking, not elsewhere classified (R26.2)     Time: 1610-9604 PT Time Calculation (min) (ACUTE ONLY): 24 min  Charges:  $Gait Training: 8-22 mins $Therapeutic Exercise: 8-22 mins                    G Codes:          Rebeca Alert, MPT Pager: [phone]

## 2017-06-04 NOTE — Progress Notes (Signed)
     Subjective: 1 Day Post-Op Procedure(s) (LRB): RIGHT TOTAL HIP ARTHROPLASTY ANTERIOR APPROACH (Right)   Patient reports pain as mild/moderate.  States that she was taking some Norco prior to surgery and not sure it is fully effective.  Discussed the increased amount and she was ok with this.  States that she is better than yesterday and would like to stay with current regimen.  No events throughout the night, other than not being able to sleep.  Ready to be discharged home.   Objective:   VITALS:   Vitals:   06/04/17 0213 06/04/17 0513  BP: 98/64 103/68  Pulse: 75 78  Resp: 16 17  Temp: 97.8 F (36.6 C) 98.1 F (36.7 C)  SpO2: 100% 100%    Dorsiflexion/Plantar flexion intact Incision: dressing C/D/I No cellulitis present Compartment soft  LABS  Recent Labs  06/04/17 0558  HGB 9.8*  HCT 29.8*  WBC 7.4  PLT 185     Recent Labs  06/04/17 0558  NA 141  K 4.0  BUN 16  CREATININE 1.00  GLUCOSE 113*     Assessment/Plan: 1 Day Post-Op Procedure(s) (LRB): RIGHT TOTAL HIP ARTHROPLASTY ANTERIOR APPROACH (Right) Advance diet Up with therapy D/C IV fluids Discharge home Follow up in 2 weeks at [facility]. Follow up with OLIN, D in 2 weeks.  Contact information:  [facility] [address] [place] [address] [phone]    Overweight (BMI 25-29.9) Estimated body mass index is 29.86 kg/m as calculated from the following:   Height as of this encounter:  (1.676 m).   Weight as of this encounter: 83.9 kg (185 lb). Patient also counseled that weight may inhibit the healing process Patient counseled that losing weight will help with future health issues        Cheryl Hoover.    PAC  06/04/2017, 8:45 AM

## 2017-06-04 NOTE — Progress Notes (Signed)
Discharge planning, no HH needs identified. No PT planned, has dme. [phone]

## 2017-06-04 NOTE — Evaluation (Signed)
Occupational Therapy Evaluation Patient Details Name: Cheryl Hoover MRN: [id number] DOB: 1956-06-12 Today's Date: 06/04/2017    History of Present Illness 61 yo female s/p R THA-direct anterior 06/03/17.    Clinical Impression   Pt was admitted for the above. All education was completed.  No further OT is needed at this time    Follow Up Recommendations  Supervision/Assistance - 24 hour    Equipment Recommendations  None recommended by OT    Recommendations for Other Services       Precautions / Restrictions Precautions Precautions: Fall Restrictions Weight Bearing Restrictions: No RLE Weight Bearing: Weight bearing as tolerated      Mobility Bed Mobility         Supine to sit: Supervision Sit to supine: Min assist   General bed mobility comments: flat bed using sheet  Transfers   Equipment used: Rolling walker (2 wheeled)   Sit to Stand: Min guard         General transfer comment: for safety; cues for UE placement    Balance                                           ADL either performed or assessed with clinical judgement   ADL Overall ADL's : Needs assistance/impaired Eating/Feeding: Independent   Grooming: Set up;Sitting   Upper Body Bathing: Set up;Sitting   Lower Body Bathing: Minimal assistance;Sit to/from stand   Upper Body Dressing : Set up;Sitting   Lower Body Dressing: Minimal assistance;Sit to/from stand   Toilet Transfer: Min guard;Stand-pivot;RW (chair)   Toileting- Architect and Hygiene: Min guard;Sit to/from stand         General ADL Comments: pt got dressed this session.  Used Teacher, adult education. She currently uses cane to don socks and back scratcher for pants.  She will look into shoehorn.  Used sheet to assist leg in and out of bed.  Educated that she could also do this to assist leg over tub ledge while sitting on tub bench.       Vision         Perception     Praxis       Pertinent Vitals/Pain Pain Score: 4  Pain Location: R hip/LE Pain Descriptors / Indicators: Aching;Sore Pain Intervention(s): Limited activity within patient's tolerance;Monitored during session;Premedicated before session;Repositioned     Hand Dominance     Extremity/Trunk Assessment Upper Extremity Assessment Upper Extremity Assessment: Generalized weakness           Communication Communication Communication: No difficulties   Cognition Arousal/Alertness: Awake/alert Behavior During Therapy: WFL for tasks assessed/performed Overall Cognitive Status: Within Functional Limits for tasks assessed                                     General Comments       Exercises     Shoulder Instructions      Home Living Family/patient expects to be discharged to:: Private residence Living Arrangements: Spouse/significant other Available Help at Discharge: Family               Bathroom Shower/Tub: Chief Strategy Officer: Standard     Home Equipment: Toilet riser;Tub bench          Prior Functioning/Environment Level of Independence: Independent with  assistive device(s)        Comments: using 2 canes for ambulation        OT Problem List:        OT Treatment/Interventions:      OT Goals(Current goals can be found in the care plan section) Acute Rehab OT Goals Patient Stated Goal: less pain OT Goal Formulation: All assessment and education complete, DC therapy  OT Frequency:     Barriers to D/C:            Co-evaluation              AM-PAC PT "6 Clicks" Daily Activity     Outcome Measure Help from another person eating meals?: None Help from another person taking care of personal grooming?: A Little Help from another person toileting, which includes using toliet, bedpan, or urinal?: A Little Help from another person bathing (including washing, rinsing, drying)?: A Little Help from another person to put on and taking  off regular upper body clothing?: A Little Help from another person to put on and taking off regular lower body clothing?: A Little 6 Click Score: 19   End of Session    Activity Tolerance: Patient tolerated treatment well Patient left: in bed;with call bell/phone within reach  OT Visit Diagnosis: Pain Pain - Right/Left: Right Pain - part of body: Hip                Time: 1610-9604 OT Time Calculation (min): 27 min Charges:  OT General Charges $OT Visit: 1 Visit OT Evaluation $OT Eval Low Complexity: 1 Low OT Treatments $Self Care/Home Management : 8-22 mins G-Codes:     [place], OTR/L [phone] 06/04/2017  , 06/04/2017, 11:23 AM

## 2017-06-05 NOTE — Discharge Summary (Signed)
Physician Discharge Summary  Patient ID: Cheryl Hoover MRN: [id number] DOB/AGE: Dec 23, 1955 61 y.o.  Admit date: 06/03/2017 Discharge date: 06/04/2017   Procedures:  Procedure(s) (LRB): RIGHT TOTAL HIP ARTHROPLASTY ANTERIOR APPROACH (Right)  Attending Physician:  Dr. Durene Romans   Admission Diagnoses:   Right hip primary OA / pain  Discharge Diagnoses:  Principal Problem:   S/P right THA, AA Active Problems:   Overweight (BMI 25.0-29.9)  Past Medical History:  Diagnosis Date  . Allergy   . Anemia   . Anxiety   . Arthritis   . Asthma   . Bipolar disorder (HCC)   . Complication of anesthesia    woke up during surgery   . Depression   . GERD (gastroesophageal reflux disease)   . Hypertension   . Scoliosis     HPI:     Cheryl Hoover, 61 y.o. female, has a history of pain and functional disability in the right hip(s) due to arthritis and patient has failed non-surgical conservative treatments for greater than 12 weeks to include NSAID's and/or analgesics, corticosteriod injections, use of assistive devices and activity modification.  Onset of symptoms was gradual starting 4+ years ago with gradually worsening course since that time.The patient noted no past surgery on the right hip(s).  Patient currently rates pain in the right hip at 9 out of 10 with activity. Patient has night pain, worsening of pain with activity and weight bearing, trendelenberg gait, pain that interfers with activities of daily living and pain with passive range of motion. Patient has evidence of periarticular osteophytes and joint space narrowing by imaging studies. This condition presents safety issues increasing the risk of falls. There is no current active infection.  Risks, benefits and expectations were discussed with the patient.  Risks including but not limited to the risk of anesthesia, blood clots, nerve damage, blood vessel damage, failure of the prosthesis, infection and up to and  including death.  Patient understand the risks, benefits and expectations and wishes to proceed with surgery.   PCP: Avis Epley, PA-C   Discharged Condition: good  Hospital Course:  Patient underwent the above stated procedure on 06/03/2017. Patient tolerated the procedure well and brought to the recovery room in good condition and subsequently to the floor.  POD #1 BP: 103/68 ; Pulse: 78 ; Temp: 98.1 F (36.7 C) ; Resp: 17 Patient reports pain as mild/moderate.  States that she was taking some Norco prior to surgery and not sure it is fully effective.  Discussed the increased amount and she was ok with this.  States that she is better than yesterday and would like to stay with current regimen.  No events throughout the night, other than not being able to sleep.  Ready to be discharged home.  Dorsiflexion/plantar flexion intact, incision: dressing C/D/I, no cellulitis present and compartment soft.   LABS  Basename    HGB     9.8  HCT     29.8    Discharge Exam: General appearance: alert, cooperative and no distress Extremities: Homans sign is negative, no sign of DVT, no edema, redness or tenderness in the calves or thighs and no ulcers, gangrene or trophic changes  Disposition: Home with follow up in 2 weeks   Follow-up Information    Durene Romans, MD. Schedule an appointment as soon as possible for a visit in 2 week(s).   Specialty:  Orthopedic Surgery Contact information: [address] [place] [address] [phone]  Discharge Instructions    Call MD / Call 911    Complete by:  As directed    If you experience chest pain or shortness of breath, CALL 911 and be transported to the hospital emergency room.  If you develope a fever above 101 F, pus (white drainage) or increased drainage or redness at the wound, or calf pain, call your surgeon's office.   Change dressing    Complete by:  As directed    Maintain surgical dressing until  follow up in the clinic. If the edges start to pull up, may reinforce with tape. If the dressing is no longer working, may remove and cover with gauze and tape, but must keep the area dry and clean.  Call with any questions or concerns.   Constipation Prevention    Complete by:  As directed    Drink plenty of fluids.  Prune juice may be helpful.  You may use a stool softener, such as Colace (over the counter) 100 mg twice a day.  Use MiraLax (over the counter) for constipation as needed.   Diet - low sodium heart healthy    Complete by:  As directed    Discharge instructions    Complete by:  As directed    Maintain surgical dressing until follow up in the clinic. If the edges start to pull up, may reinforce with tape. If the dressing is no longer working, may remove and cover with gauze and tape, but must keep the area dry and clean.  Follow up in 2 weeks at [facility]. Call with any questions or concerns.   Increase activity slowly as tolerated    Complete by:  As directed    Weight bearing as tolerated with assist device (walker, cane, etc) as directed, use it as long as suggested by your surgeon or therapist, typically at least 4-6 weeks.   TED hose    Complete by:  As directed    Use stockings (TED hose) for 2 weeks on both leg(s).  You may remove them at night for sleeping.      Allergies as of 06/04/2017      Reactions   Penicillins Other (See Comments)   unknown Has patient had a PCN reaction causing immediate rash, facial/tongue/throat swelling, SOB or lightheadedness with hypotension: Unknown Has patient had a PCN reaction causing severe rash involving mucus membranes or skin necrosis: Unknown Has patient had a PCN reaction that required hospitalization: Unknown Has patient had a PCN reaction occurring within the last 10 years: No If all of the above answers are "NO", then may proceed with Cephalosporin use.      Medication List    STOP taking these medications     acetaminophen 500 MG tablet Commonly known as:  TYLENOL   HYDROcodone-acetaminophen 10-325 MG tablet Commonly known as:  NORCO Replaced by:  HYDROcodone-acetaminophen 7.5-325 MG tablet   meloxicam 15 MG tablet Commonly known as:  MOBIC     TAKE these medications   albuterol 108 (90 Base) MCG/ACT inhaler Commonly known as:  VENTOLIN HFA Inhale 2 puffs into the lungs every 4 (four) hours as needed for wheezing. What changed:  reasons to take this   ALPRAZolam 0.5 MG tablet Commonly known as:  XANAX Take 0.5 mg by mouth daily as needed for anxiety or sleep.   aspirin 81 MG chewable tablet Commonly known as:  ASPIRIN CHILDRENS Chew 1 tablet (81 mg total) by mouth 2 (two) times daily. Take for 4 weeks.  Notes to patient:  Take for blood clot prevention; from Dr. Charlann Boxer   bismuth subsalicylate 262 MG/15ML suspension Commonly known as:  PEPTO BISMOL Take 30 mLs by mouth 2 (two) times daily as needed for indigestion or diarrhea or loose stools.   clonazePAM 0.5 MG tablet Commonly known as:  KLONOPIN Take 1 mg by mouth at bedtime.   docusate sodium 100 MG capsule Commonly known as:  COLACE Take 1 capsule (100 mg total) by mouth 2 (two) times daily. Notes to patient:  Stool softener   esomeprazole 20 MG capsule Commonly known as:  NEXIUM Take 20 mg by mouth every evening.   ferrous sulfate 325 (65 FE) MG tablet Commonly known as:  FERROUSUL Take 1 tablet (325 mg total) by mouth 3 (three) times daily with meals.   Fluticasone-Salmeterol 250-50 MCG/DOSE Aepb Commonly known as:  ADVAIR DISKUS INHALE ONE PUFF BY MOUTH TWICE DAILY What changed:  how much to take  how to take this  when to take this  reasons to take this  additional instructions   HYDROcodone-acetaminophen 7.5-325 MG tablet Commonly known as:  NORCO Take 1-2 tablets by mouth every 4 (four) hours as needed for moderate pain or severe pain. Replaces:  HYDROcodone-acetaminophen 10-325 MG tablet    lamoTRIgine 100 MG tablet Commonly known as:  LAMICTAL Take 100 mg by mouth 2 (two) times daily.   lisinopril 20 MG tablet Commonly known as:  PRINIVIL,ZESTRIL Take 20 mg by mouth daily.   loratadine 10 MG tablet Commonly known as:  CLARITIN Take 10 mg by mouth daily.   Magnesium Oxide 250 MG Tabs Take 250 mg by mouth at bedtime.   methocarbamol 500 MG tablet Commonly known as:  ROBAXIN Take 1 tablet (500 mg total) by mouth every 6 (six) hours as needed for muscle spasms. Notes to patient:  Muscle relaxer   montelukast 10 MG tablet Commonly known as:  SINGULAIR Take 1 tablet by mouth daily. What changed:  how much to take  how to take this  when to take this  additional instructions   polyethylene glycol packet Commonly known as:  MIRALAX / GLYCOLAX Take 17 g by mouth 2 (two) times daily. Notes to patient:  Laxative powder   ranitidine 150 MG tablet Commonly known as:  ZANTAC TAKE ONE TABLET BY MOUTH TWICE DAILY. What changed:  how much to take  how to take this  when to take this  additional instructions   rOPINIRole 0.5 MG tablet Commonly known as:  REQUIP Take 0.5 mg by mouth 2 (two) times daily.   topiramate 100 MG tablet Commonly known as:  TOPAMAX Take 100 mg by mouth 2 (two) times daily.            Discharge Care Instructions        Start     Ordered   06/04/17 0000  Change dressing    Comments:  Maintain surgical dressing until follow up in the clinic. If the edges start to pull up, may reinforce with tape. If the dressing is no longer working, may remove and cover with gauze and tape, but must keep the area dry and clean.  Call with any questions or concerns.   06/04/17 1610       Signed: Anastasio Auerbach.    PA-C  06/05/2017, 9:49 AM

## 2017-10-01 DIAGNOSIS — Z0100 Encounter for examination of eyes and vision without abnormal findings: Secondary | ICD-10-CM | POA: Diagnosis not present

## 2017-10-01 DIAGNOSIS — I10 Essential (primary) hypertension: Secondary | ICD-10-CM | POA: Diagnosis not present

## 2017-10-01 DIAGNOSIS — H5200 Hypermetropia, unspecified eye: Secondary | ICD-10-CM | POA: Diagnosis not present

## 2017-10-10 DIAGNOSIS — R69 Illness, unspecified: Secondary | ICD-10-CM | POA: Diagnosis not present

## 2017-10-10 DIAGNOSIS — Z1389 Encounter for screening for other disorder: Secondary | ICD-10-CM | POA: Diagnosis not present

## 2017-10-10 DIAGNOSIS — M1612 Unilateral primary osteoarthritis, left hip: Secondary | ICD-10-CM | POA: Diagnosis not present

## 2017-10-10 DIAGNOSIS — Z6836 Body mass index (BMI) 36.0-36.9, adult: Secondary | ICD-10-CM | POA: Diagnosis not present

## 2017-10-10 DIAGNOSIS — I10 Essential (primary) hypertension: Secondary | ICD-10-CM | POA: Diagnosis not present

## 2017-10-10 DIAGNOSIS — E6609 Other obesity due to excess calories: Secondary | ICD-10-CM | POA: Diagnosis not present

## 2017-10-10 DIAGNOSIS — M419 Scoliosis, unspecified: Secondary | ICD-10-CM | POA: Diagnosis not present

## 2017-10-10 DIAGNOSIS — E782 Mixed hyperlipidemia: Secondary | ICD-10-CM | POA: Diagnosis not present

## 2017-10-28 DIAGNOSIS — G8929 Other chronic pain: Secondary | ICD-10-CM | POA: Diagnosis not present

## 2017-10-28 DIAGNOSIS — Z79891 Long term (current) use of opiate analgesic: Secondary | ICD-10-CM | POA: Diagnosis not present

## 2017-10-28 DIAGNOSIS — G894 Chronic pain syndrome: Secondary | ICD-10-CM | POA: Diagnosis not present

## 2017-10-28 DIAGNOSIS — M79671 Pain in right foot: Secondary | ICD-10-CM | POA: Diagnosis not present

## 2017-10-28 DIAGNOSIS — M545 Low back pain: Secondary | ICD-10-CM | POA: Diagnosis not present

## 2017-12-10 DIAGNOSIS — R69 Illness, unspecified: Secondary | ICD-10-CM | POA: Diagnosis not present

## 2018-01-05 DIAGNOSIS — M1612 Unilateral primary osteoarthritis, left hip: Secondary | ICD-10-CM | POA: Diagnosis not present

## 2018-01-06 DIAGNOSIS — I10 Essential (primary) hypertension: Secondary | ICD-10-CM | POA: Diagnosis not present

## 2018-01-06 DIAGNOSIS — M1612 Unilateral primary osteoarthritis, left hip: Secondary | ICD-10-CM | POA: Diagnosis not present

## 2018-01-06 DIAGNOSIS — Z0001 Encounter for general adult medical examination with abnormal findings: Secondary | ICD-10-CM | POA: Diagnosis not present

## 2018-01-06 DIAGNOSIS — R69 Illness, unspecified: Secondary | ICD-10-CM | POA: Diagnosis not present

## 2018-01-06 DIAGNOSIS — Z6834 Body mass index (BMI) 34.0-34.9, adult: Secondary | ICD-10-CM | POA: Diagnosis not present

## 2018-01-06 DIAGNOSIS — E6609 Other obesity due to excess calories: Secondary | ICD-10-CM | POA: Diagnosis not present

## 2018-01-07 DIAGNOSIS — Z6834 Body mass index (BMI) 34.0-34.9, adult: Secondary | ICD-10-CM | POA: Diagnosis not present

## 2018-01-07 DIAGNOSIS — R739 Hyperglycemia, unspecified: Secondary | ICD-10-CM | POA: Diagnosis not present

## 2018-01-07 DIAGNOSIS — E782 Mixed hyperlipidemia: Secondary | ICD-10-CM | POA: Diagnosis not present

## 2018-01-07 DIAGNOSIS — Z0000 Encounter for general adult medical examination without abnormal findings: Secondary | ICD-10-CM | POA: Diagnosis not present

## 2018-01-07 DIAGNOSIS — Z1389 Encounter for screening for other disorder: Secondary | ICD-10-CM | POA: Diagnosis not present

## 2018-01-07 DIAGNOSIS — E6609 Other obesity due to excess calories: Secondary | ICD-10-CM | POA: Diagnosis not present

## 2018-01-20 DIAGNOSIS — I10 Essential (primary) hypertension: Secondary | ICD-10-CM | POA: Diagnosis not present

## 2018-01-23 NOTE — Patient Instructions (Addendum)
Cheryl Hoover  01/23/2018   Your procedure is scheduled on: 02-03-18   Report to [facility] Main  Entrance    Report to Admitting at 12:30 PM    Call this number if you have problems the morning of surgery [phone]   Remember: Do not eat food or drink liquids :After Midnight. You may have a Clear Liquid Diet from Midnight until 9:00 AM. After 9: 00AM, nothing until after surgery.     CLEAR LIQUID DIET   Foods Allowed                                                                     Foods Excluded  Coffee and tea, regular and decaf                             liquids that you cannot  Plain Jell-O in any flavor                                             see through such as: Fruit ices (not with fruit pulp)                                     milk, soups, orange juice  Iced Popsicles                                    All solid food Carbonated beverages, regular and diet                                    Cranberry, grape and apple juices Sports drinks like Gatorade Lightly seasoned clear broth or consume(fat free) Sugar, honey syrup  Sample Menu Breakfast                                Lunch                                     Supper Cranberry juice                    Beef broth                            Chicken broth Jell-O                                     Grape juice  Apple juice Coffee or tea                        Jell-O                                      Popsicle                                                Coffee or tea                        Coffee or tea  _____________________________________________________________________     Take these medicines the morning of surgery with A SIP OF WATER: Lamotrigine (Lamictal), and Topamax (Topiramate)                                You may not have any metal on your body including hair pins and              piercings  Do not wear jewelry, make-up, lotions,  powders or perfumes, deodorant             Do not wear nail polish.  Do not shave  48 hours prior to surgery.              Do not bring valuables to the hospital. [facility] IS NOT             RESPONSIBLE   FOR VALUABLES.  Contacts, dentures or bridgework may not be worn into surgery.  Leave suitcase in the car. After surgery it may be brought to your room.     Special Instructions: N/A              Please read over the following fact sheets you were given: _____________________________________________________________________        [facility] - Preparing for Surgery Before surgery, you can play an important role.  Because skin is not sterile, your skin needs to be as free of germs as possible.  You can reduce the number of germs on your skin by washing with CHG (chlorahexidine gluconate) soap before surgery.  CHG is an antiseptic cleaner which kills germs and bonds with the skin to continue killing germs even after washing. Please DO NOT use if you have an allergy to CHG or antibacterial soaps.  If your skin becomes reddened/irritated stop using the CHG and inform your nurse when you arrive at Short Stay. Do not shave (including legs and underarms) for at least 48 hours prior to the first CHG shower.  You may shave your face/neck. Please follow these instructions carefully:  1.  Shower with CHG Soap the night before surgery and the  morning of Surgery.  2.  If you choose to wash your hair, wash your hair first as usual with your  normal  shampoo.  3.  After you shampoo, rinse your hair and body thoroughly to remove the  shampoo.                           4.  Use CHG as you would any other liquid soap.  You can apply  chg directly  to the skin and wash                       Gently with a scrungie or clean washcloth.  5.  Apply the CHG Soap to your body ONLY FROM THE NECK DOWN.   Do not use on face/ open                           Wound or open sores. Avoid contact with eyes, ears mouth and  genitals (private parts).                       Wash face,  Genitals (private parts) with your normal soap.             6.  Wash thoroughly, paying special attention to the area where your surgery  will be performed.  7.  Thoroughly rinse your body with warm water from the neck down.  8.  DO NOT shower/wash with your normal soap after using and rinsing off  the CHG Soap.                9.  Pat yourself dry with a clean towel.            10.  Wear clean pajamas.            11.  Place clean sheets on your bed the night of your first shower and do not  sleep with pets. Day of Surgery : Do not apply any lotions/deodorants the morning of surgery.  Please wear clean clothes to the hospital/surgery center.  FAILURE TO FOLLOW THESE INSTRUCTIONS MAY RESULT IN THE CANCELLATION OF YOUR SURGERY PATIENT SIGNATURE_________________________________  NURSE SIGNATURE__________________________________  ________________________________________________________________________   Cheryl Hoover  An incentive spirometer is a tool that can help keep your lungs clear and active. This tool measures how well you are filling your lungs with each breath. Taking long deep breaths may help reverse or decrease the chance of developing breathing (pulmonary) problems (especially infection) following:  A long period of time when you are unable to move or be active. BEFORE THE PROCEDURE   If the spirometer includes an indicator to show your best effort, your nurse or respiratory therapist will set it to a desired goal.  If possible, sit up straight or lean slightly forward. Try not to slouch.  Hold the incentive spirometer in an upright position. INSTRUCTIONS FOR USE  1. Sit on the edge of your bed if possible, or sit up as far as you can in bed or on a chair. 2. Hold the incentive spirometer in an upright position. 3. Breathe out normally. 4. Place the mouthpiece in your mouth and seal your lips tightly around  it. 5. Breathe in slowly and as deeply as possible, raising the piston or the ball toward the top of the column. 6. Hold your breath for 3-5 seconds or for as long as possible. Allow the piston or ball to fall to the bottom of the column. 7. Remove the mouthpiece from your mouth and breathe out normally. 8. Rest for a few seconds and repeat Steps 1 through 7 at least 10 times every 1-2 hours when you are awake. Take your time and take a few normal breaths between deep breaths. 9. The spirometer may include an indicator to show your best effort. Use the indicator as a goal to work toward during each  repetition. 10. After each set of 10 deep breaths, practice coughing to be sure your lungs are clear. If you have an incision (the cut made at the time of surgery), support your incision when coughing by placing a pillow or rolled up towels firmly against it. Once you are able to get out of bed, walk around indoors and cough well. You may stop using the incentive spirometer when instructed by your caregiver.  RISKS AND COMPLICATIONS  Take your time so you do not get dizzy or light-headed.  If you are in pain, you may need to take or ask for pain medication before doing incentive spirometry. It is harder to take a deep breath if you are having pain. AFTER USE  Rest and breathe slowly and easily.  It can be helpful to keep track of a log of your progress. Your caregiver can provide you with a simple table to help with this. If you are using the spirometer at home, follow these instructions: [place] IF:   You are having difficultly using the spirometer.  You have trouble using the spirometer as often as instructed.  Your pain medication is not giving enough relief while using the spirometer.  You develop fever of 100.5 F (38.1 C) or higher. SEEK IMMEDIATE MEDICAL CARE IF:   You cough up bloody sputum that had not been present before.  You develop fever of 102 F (38.9 C) or  greater.  You develop worsening pain at or near the incision site. MAKE SURE YOU:   Understand these instructions.  Will watch your condition.  Will get help right away if you are not doing well or get worse. Document Released: 12/23/2006 Document Revised: 11/04/2011 Document Reviewed: 02/23/2007 Exit[employer] Patient Information 2014 [employer], [place].   ________________________________________________________________________  WHAT IS A BLOOD TRANSFUSION? Blood Transfusion Information  A transfusion is the replacement of blood or some of its parts. Blood is made up of multiple cells which provide different functions.  Red blood cells carry oxygen and are used for blood loss replacement.  White blood cells fight against infection.  Platelets control bleeding.  Plasma helps clot blood.  Other blood products are available for specialized needs, such as hemophilia or other clotting disorders. BEFORE THE TRANSFUSION  Who gives blood for transfusions?   Healthy volunteers who are fully evaluated to make sure their blood is safe. This is blood bank blood. Transfusion therapy is the safest it has ever been in the practice of medicine. Before blood is taken from a donor, a complete history is taken to make sure that person has no history of diseases nor engages in risky social behavior (examples are intravenous drug use or sexual activity with multiple partners). The donor's travel history is screened to minimize risk of transmitting infections, such as malaria. The donated blood is tested for signs of infectious diseases, such as HIV and hepatitis. The blood is then tested to be sure it is compatible with you in order to minimize the chance of a transfusion reaction. If you or a relative donates blood, this is often done in anticipation of surgery and is not appropriate for emergency situations. It takes many days to process the donated blood. RISKS AND COMPLICATIONS Although transfusion therapy  is very safe and saves many lives, the main dangers of transfusion include:   Getting an infectious disease.  Developing a transfusion reaction. This is an allergic reaction to something in the blood you were given. Every precaution is taken to prevent this.  The decision to have a blood transfusion has been considered carefully by your caregiver before blood is given. Blood is not given unless the benefits outweigh the risks. AFTER THE TRANSFUSION  Right after receiving a blood transfusion, you will usually feel much better and more energetic. This is especially true if your red blood cells have gotten low (anemic). The transfusion raises the level of the red blood cells which carry oxygen, and this usually causes an energy increase.  The nurse administering the transfusion will monitor you carefully for complications. HOME CARE INSTRUCTIONS  No special instructions are needed after a transfusion. You may find your energy is better. Speak with your caregiver about any limitations on activity for underlying diseases you may have. SEEK MEDICAL CARE IF:   Your condition is not improving after your transfusion.  You develop redness or irritation at the intravenous (IV) site. SEEK IMMEDIATE MEDICAL CARE IF:  Any of the following symptoms occur over the next 12 hours:  Shaking chills.  You have a temperature by mouth above 102 F (38.9 C), not controlled by medicine.  Chest, back, or muscle pain.  People around you feel you are not acting correctly or are confused.  Shortness of breath or difficulty breathing.  Dizziness and fainting.  You get a rash or develop hives.  You have a decrease in urine output.  Your urine turns a dark color or changes to pink, red, or brown. Any of the following symptoms occur over the next 10 days:  You have a temperature by mouth above 102 F (38.9 C), not controlled by medicine.  Shortness of breath.  Weakness after normal activity.  The white  part of the eye turns yellow (jaundice).  You have a decrease in the amount of urine or are urinating less often.  Your urine turns a dark color or changes to pink, red, or brown. Document Released: 08/09/2000 Document Revised: 11/04/2011 Document Reviewed: 03/28/2008 [facility] Patient Information 2014 [place], [place].  _______________________________________________________________________

## 2018-01-26 ENCOUNTER — Encounter (HOSPITAL_COMMUNITY): Payer: Self-pay

## 2018-01-26 ENCOUNTER — Other Ambulatory Visit: Payer: Self-pay

## 2018-01-26 ENCOUNTER — Encounter (HOSPITAL_COMMUNITY)
Admission: RE | Admit: 2018-01-26 | Discharge: 2018-01-26 | Disposition: A | Discharge status: Home / Self Care | Payer: Medicare HMO | Source: Ambulatory Visit | Attending: Orthopedic Surgery | Admitting: Orthopedic Surgery

## 2018-01-26 DIAGNOSIS — M1612 Unilateral primary osteoarthritis, left hip: Secondary | ICD-10-CM | POA: Insufficient documentation

## 2018-01-26 DIAGNOSIS — Z01812 Encounter for preprocedural laboratory examination: Secondary | ICD-10-CM | POA: Insufficient documentation

## 2018-01-26 LAB — CBC
HCT: 40 % (ref 36.0–46.0)
Hemoglobin: 12.9 g/dL (ref 12.0–15.0)
MCH: 30.9 pg (ref 26.0–34.0)
MCHC: 32.3 g/dL (ref 30.0–36.0)
MCV: 95.9 fL (ref 78.0–100.0)
PLATELETS: 262 10*3/uL (ref 150–400)
RBC: 4.17 MIL/uL (ref 3.87–5.11)
RDW: 13 % (ref 11.5–15.5)
WBC: 6.3 10*3/uL (ref 4.0–10.5)

## 2018-01-26 LAB — BASIC METABOLIC PANEL
Anion gap: 11 (ref 5–15)
BUN: 24 mg/dL — ABNORMAL HIGH (ref 6–20)
CALCIUM: 9.3 mg/dL (ref 8.9–10.3)
CO2: 20 mmol/L — AB (ref 22–32)
CREATININE: 1.06 mg/dL — AB (ref 0.44–1.00)
Chloride: 110 mmol/L (ref 101–111)
GFR calc Af Amer: >60 mL/min (ref >60)
GFR, EST NON AFRICAN AMERICAN: 55 mL/min — AB (ref >60)
Glucose, Bld: 101 mg/dL — ABNORMAL HIGH (ref 65–99)
Potassium: 4.1 mmol/L (ref 3.5–5.1)
Sodium: 141 mmol/L (ref 135–145)

## 2018-01-26 LAB — SURGICAL PCR SCREEN
MRSA, PCR: NEGATIVE
STAPHYLOCOCCUS AUREUS: NEGATIVE

## 2018-01-27 NOTE — Progress Notes (Signed)
01-26-18 BMP resulted to Dr. Charlann Boxerlin for review.

## 2018-02-01 NOTE — H&P (Signed)
TOTAL HIP ADMISSION H&P  Patient is admitted for left total hip arthroplasty, anterior approach.  Subjective:  Chief Complaint:   Left hip primary OA / pain  HPI: Cheryl Hoover, 62 y.o. female, has a history of pain and functional disability in the left hip(s) due to arthritis and patient has failed non-surgical conservative treatments for greater than 12 weeks to include NSAID's and/or analgesics, corticosteriod injections, use of assistive devices and activity modification.  Onset of symptoms was abrupt starting in Janurary 2019 with rapidlly worsening course since that time.The patient noted no past surgery on the left hip(s).  Patient currently rates pain in the left hip at 10 out of 10 with activity. Patient has night pain, worsening of pain with activity and weight bearing, trendelenberg gait, pain that interfers with activities of daily living and pain with passive range of motion. Patient has evidence of periarticular osteophytes and joint space narrowing by imaging studies. This condition presents safety issues increasing the risk of falls.  There is no current active infection.  Risks, benefits and expectations were discussed with the patient.  Risks including but not limited to the risk of anesthesia, blood clots, nerve damage, blood vessel damage, failure of the prosthesis, infection and up to and including death.  Patient understand the risks, benefits and expectations and wishes to proceed with surgery.   PCP: Avis Epley, PA-C  D/C Plans:       Home   Post-op Meds:       No Rx given / Rx given for ASA, Robaxin, Celebrex, Iron, Colace and MiraLax  Tranexamic Acid:      To be given - IV   Decadron:      Is to be given  FYI:      ASA  Norco  DME:   Pt already has equipment  PT:   No PT    Patient Active Problem List   Diagnosis Date Noted  . Overweight (BMI 25.0-29.9) 06/04/2017  . S/P right THA, AA 06/03/2017  . Lumbosacral radiculopathy 04/26/2015  .  Degenerative lumbar spinal stenosis 04/26/2015  . Sliding hiatal hernia, recurrent 09/16/2011  . ANXIETY DEPRESSION 04/21/2009  . ASTHMA, MILD 04/21/2009  . GERD 04/21/2009  . CONSTIPATION 04/21/2009  . SCOLIOSIS 04/21/2009  . EARLY SATIETY 04/21/2009  . ABDOMINAL PAIN, EPIGASTRIC 04/21/2009  . ANOREXIA NERVOSA, HX OF 04/21/2009  . BULIMIA, HX OF 04/21/2009   Past Medical History:  Diagnosis Date  . Allergy   . Anemia   . Anxiety   . Arthritis   . Asthma   . Bipolar disorder (HCC)   . Complication of anesthesia    woke up during surgery   . Depression   . GERD (gastroesophageal reflux disease)   . Hypertension   . Scoliosis     Past Surgical History:  Procedure Laterality Date  . c-sections  34  . CARPAL TUNNEL RELEASE    . CESAREAN SECTION  1982  . corrective eye surgery    . HERNIA REPAIR  04/2012   hiatial hernia  . lap nissen w/paraesophageal hernia repari  05-21-2010  . TONSILECTOMY, ADENOIDECTOMY, BILATERAL MYRINGOTOMY AND TUBES     patient denies tubes in ears   . TONSILLECTOMY    . TOTAL HIP ARTHROPLASTY Right 06/03/2017   Procedure: RIGHT TOTAL HIP ARTHROPLASTY ANTERIOR APPROACH;  Surgeon: Durene Romans, MD;  Location: [facility];  Service: Orthopedics;  Laterality: Right;  70 mins    No current facility-administered medications for this encounter.  Current Outpatient Medications  Medication Sig Dispense Refill Last Dose  . acetaminophen (TYLENOL) 325 MG tablet Take 650 mg by mouth daily as needed for moderate pain or headache.     . albuterol (VENTOLIN HFA) 108 (90 BASE) MCG/ACT inhaler Inhale 2 puffs into the lungs every 4 (four) hours as needed for wheezing. (Patient taking differently: Inhale 2 puffs into the lungs every 4 (four) hours as needed for wheezing or shortness of breath. ) 18 g 6 More than a month at Unknown time  . ALPRAZolam (XANAX) 0.5 MG tablet Take 0.5 mg by mouth daily as needed for anxiety or sleep.    More than a month at Unknown time  .  bismuth subsalicylate (PEPTO BISMOL) 262 MG/15ML suspension Take 30 mLs by mouth 2 (two) times daily as needed for indigestion or diarrhea or loose stools.    05/13/2017 at Unknown time  . clonazePAM (KLONOPIN) 0.5 MG tablet Take 1 mg by mouth at bedtime.    06/02/2017 at Unknown time  . docusate sodium (COLACE) 100 MG capsule Take 1 capsule (100 mg total) by mouth 2 (two) times daily. (Patient taking differently: Take 100 mg by mouth at bedtime. ) 10 capsule 0   . esomeprazole (NEXIUM) 20 MG capsule Take 20 mg by mouth at bedtime.    06/02/2017 at Unknown time  . Fluticasone-Salmeterol (ADVAIR DISKUS) 250-50 MCG/DOSE AEPB INHALE ONE PUFF BY MOUTH TWICE DAILY (Patient taking differently: Inhale 2 puffs into the lungs 2 (two) times daily as needed (shortness of breath). ) 60 each 3 More than a month at Unknown time  . HYDROcodone-acetaminophen (NORCO) 10-325 MG tablet Take 1 tablet by mouth every 6 (six) hours as needed for moderate pain.     Marland Kitchen. lamoTRIgine (LAMICTAL) 100 MG tablet Take 100 mg by mouth 2 (two) times daily.    06/03/2017 at 0530  . lisinopril (PRINIVIL,ZESTRIL) 20 MG tablet Take 20 mg by mouth daily.   06/02/2017 at Unknown time  . loratadine (CLARITIN) 10 MG tablet Take 10 mg by mouth at bedtime.    06/02/2017 at Unknown time  . meloxicam (MOBIC) 15 MG tablet Take 15 mg by mouth daily.     . montelukast (SINGULAIR) 10 MG tablet Take 1 tablet by mouth daily. (Patient taking differently: Take 10 mg by mouth at bedtime. ) 90 tablet 1 06/02/2017 at Unknown time  . ranitidine (ZANTAC) 150 MG tablet TAKE ONE TABLET BY MOUTH TWICE DAILY. (Patient taking differently: Take 150 mg by mouth daily as needed for heartburn. ) 60 tablet 0 05/27/2017  . rOPINIRole (REQUIP) 0.5 MG tablet Take 0.5 mg by mouth 2 (two) times daily. At 5pm and bedtime   06/02/2017 at Unknown time  . topiramate (TOPAMAX) 100 MG tablet Take 100 mg by mouth 2 (two) times daily.   06/03/2017 at 0530  . Vitamins A & D (VITAMIN A & D)  ointment Apply 1 application topically as needed for dry skin.     . ferrous sulfate (FERROUSUL) 325 (65 FE) MG tablet Take 1 tablet (325 mg total) by mouth 3 (three) times daily with meals. (Patient not taking: Reported on 01/22/2018)  3 Not Taking at Unknown time  . methocarbamol (ROBAXIN) 500 MG tablet Take 1 tablet (500 mg total) by mouth every 6 (six) hours as needed for muscle spasms. (Patient not taking: Reported on 01/22/2018) 40 tablet 0 Completed Course at Unknown time  . polyethylene glycol (MIRALAX / GLYCOLAX) packet Take 17 g by mouth 2 (two)  times daily. (Patient not taking: Reported on 01/22/2018) 14 each 0 Not Taking at Unknown time   Allergies  Allergen Reactions  . Avocado Itching  . Penicillins Other (See Comments)    Unknown,childhood allergy  Has patient had a PCN reaction causing immediate rash, facial/tongue/throat swelling, SOB or lightheadedness with hypotension: Unknown Has patient had a PCN reaction causing severe rash involving mucus membranes or skin necrosis: Unknown Has patient had a PCN reaction that required hospitalization: Unknown Has patient had a PCN reaction occurring within the last 10 years: No If all of the above answers are "NO", then may proceed with Cephalosporin use.     Social History   Tobacco Use  . Smoking status: Former Smoker    Types: Cigarettes    Last attempt to quit: 08/26/2008    Years since quitting: 9.4  . Smokeless tobacco: Never Used  Substance Use Topics  . Alcohol use: No    Alcohol/week: 0.0 oz    Comment: hx of     Family History  Problem Relation Age of Onset  . Hypertension Mother        Deceased  . Anxiety disorder Mother   . Parkinson's disease Father        Deceased  . Anxiety disorder Father   . Anxiety disorder Brother   . Anxiety disorder Son   . Diabetes Son        Deceased, 43  . Healthy Son      Review of Systems  Constitutional: Positive for malaise/fatigue.  HENT: Positive for hearing loss and  tinnitus.   Eyes: Negative.   Respiratory: Positive for shortness of breath (with exertion).   Cardiovascular: Positive for leg swelling.  Gastrointestinal: Positive for constipation and heartburn.  Genitourinary: Negative.   Musculoskeletal: Positive for back pain and joint pain.  Skin: Negative.   Neurological: Negative.   Endo/Heme/Allergies: Positive for environmental allergies.  Psychiatric/Behavioral: Positive for depression and memory loss. The patient is nervous/anxious.     Objective:  Physical Exam  Constitutional: She is oriented to person, place, and time. She appears well-developed.  HENT:  Head: Normocephalic.  Mouth/Throat: She has dentures (partial).  Eyes: Pupils are equal, round, and reactive to light.  Neck: Neck supple. No JVD present. No tracheal deviation present. No thyromegaly present.  Cardiovascular: Normal rate, regular rhythm and intact distal pulses.  Respiratory: Effort normal and breath sounds normal. No respiratory distress. She has no wheezes.  GI: Soft. There is no tenderness. There is no guarding.  Musculoskeletal:       Left hip: She exhibits decreased range of motion, decreased strength, tenderness and bony tenderness. She exhibits no swelling, no deformity and no laceration.  Lymphadenopathy:    She has no cervical adenopathy.  Neurological: She is alert and oriented to person, place, and time.  Skin: Skin is warm and dry.  Psychiatric: She has a normal mood and affect.      Labs:   Estimated body mass index is 30.02 kg/m as calculated from the following:   Height as of 01/26/18: 5\' 6"  (1.676 m).   Weight as of 01/26/18: 84.4 kg (186 lb).   Imaging Review Plain radiographs demonstrate severe degenerative joint disease of the left hip(s). The bone quality appears to be good for age and reported activity level.    Preoperative templating of the joint replacement has been completed, documented, and submitted to the Operating Room  personnel in order to optimize intra-operative equipment management.     Assessment/Plan:  End stage arthritis, left hip  The patient history, physical examination, clinical judgement of the provider and imaging studies are consistent with end stage degenerative joint disease of the left hip(s) and total hip arthroplasty is deemed medically necessary. The treatment options including medical management, injection therapy, arthroscopy and arthroplasty were discussed at length. The risks and benefits of total hip arthroplasty were presented and reviewed. The risks due to aseptic loosening, infection, stiffness, dislocation/subluxation,  thromboembolic complications and other imponderables were discussed.  The patient acknowledged the explanation, agreed to proceed with the plan and consent was signed. Patient is being admitted for inpatient treatment for surgery, pain control, PT, OT, prophylactic antibiotics, VTE prophylaxis, progressive ambulation and ADL's and discharge planning.The patient is planning to be discharged home.     Anastasio Auerbach    PA-C  02/01/2018, 9:19 PM

## 2018-02-02 MED ORDER — TRANEXAMIC ACID 1000 MG/10ML IV SOLN
1000.0000 mg | INTRAVENOUS | Status: AC
  Administered 2018-02-03: 1000 mg via INTRAVENOUS
  Filled 2018-02-02: qty 1100

## 2018-02-03 ENCOUNTER — Inpatient Hospital Stay (HOSPITAL_COMMUNITY)
Admission: RE | Admit: 2018-02-03 | Discharge: 2018-02-04 | DRG: 470 | Disposition: A | Discharge status: Home / Self Care | Payer: Medicare HMO | Source: Ambulatory Visit | Attending: Orthopedic Surgery | Admitting: Orthopedic Surgery

## 2018-02-03 ENCOUNTER — Inpatient Hospital Stay (HOSPITAL_COMMUNITY): Payer: Medicare HMO

## 2018-02-03 ENCOUNTER — Encounter (HOSPITAL_COMMUNITY)
Admission: RE | Disposition: A | Discharge status: Home / Self Care | Payer: Self-pay | Source: Ambulatory Visit | Attending: Orthopedic Surgery

## 2018-02-03 ENCOUNTER — Inpatient Hospital Stay (HOSPITAL_COMMUNITY): Payer: Medicare HMO | Admitting: Certified Registered Nurse Anesthetist

## 2018-02-03 ENCOUNTER — Telehealth (HOSPITAL_COMMUNITY): Payer: Self-pay | Admitting: *Deleted

## 2018-02-03 ENCOUNTER — Other Ambulatory Visit: Payer: Self-pay

## 2018-02-03 ENCOUNTER — Encounter (HOSPITAL_COMMUNITY): Payer: Self-pay | Admitting: Emergency Medicine

## 2018-02-03 DIAGNOSIS — J449 Chronic obstructive pulmonary disease, unspecified: Secondary | ICD-10-CM | POA: Diagnosis not present

## 2018-02-03 DIAGNOSIS — Z820 Family history of epilepsy and other diseases of the nervous system: Secondary | ICD-10-CM | POA: Diagnosis not present

## 2018-02-03 DIAGNOSIS — F319 Bipolar disorder, unspecified: Secondary | ICD-10-CM | POA: Diagnosis present

## 2018-02-03 DIAGNOSIS — E669 Obesity, unspecified: Secondary | ICD-10-CM | POA: Diagnosis present

## 2018-02-03 DIAGNOSIS — Z91018 Allergy to other foods: Secondary | ICD-10-CM

## 2018-02-03 DIAGNOSIS — M419 Scoliosis, unspecified: Secondary | ICD-10-CM | POA: Diagnosis present

## 2018-02-03 DIAGNOSIS — Z791 Long term (current) use of non-steroidal anti-inflammatories (NSAID): Secondary | ICD-10-CM | POA: Diagnosis not present

## 2018-02-03 DIAGNOSIS — K219 Gastro-esophageal reflux disease without esophagitis: Secondary | ICD-10-CM | POA: Diagnosis present

## 2018-02-03 DIAGNOSIS — F418 Other specified anxiety disorders: Secondary | ICD-10-CM | POA: Diagnosis present

## 2018-02-03 DIAGNOSIS — Z833 Family history of diabetes mellitus: Secondary | ICD-10-CM | POA: Diagnosis not present

## 2018-02-03 DIAGNOSIS — Z8249 Family history of ischemic heart disease and other diseases of the circulatory system: Secondary | ICD-10-CM | POA: Diagnosis not present

## 2018-02-03 DIAGNOSIS — I10 Essential (primary) hypertension: Secondary | ICD-10-CM | POA: Diagnosis not present

## 2018-02-03 DIAGNOSIS — M25552 Pain in left hip: Secondary | ICD-10-CM | POA: Diagnosis present

## 2018-02-03 DIAGNOSIS — Z87891 Personal history of nicotine dependence: Secondary | ICD-10-CM

## 2018-02-03 DIAGNOSIS — Z471 Aftercare following joint replacement surgery: Secondary | ICD-10-CM | POA: Diagnosis not present

## 2018-02-03 DIAGNOSIS — Z880 Allergy status to penicillin: Secondary | ICD-10-CM | POA: Diagnosis not present

## 2018-02-03 DIAGNOSIS — J45909 Unspecified asthma, uncomplicated: Secondary | ICD-10-CM | POA: Diagnosis not present

## 2018-02-03 DIAGNOSIS — Z96641 Presence of right artificial hip joint: Secondary | ICD-10-CM | POA: Diagnosis not present

## 2018-02-03 DIAGNOSIS — Z6830 Body mass index (BMI) 30.0-30.9, adult: Secondary | ICD-10-CM | POA: Diagnosis not present

## 2018-02-03 DIAGNOSIS — M5417 Radiculopathy, lumbosacral region: Secondary | ICD-10-CM | POA: Diagnosis present

## 2018-02-03 DIAGNOSIS — Z96642 Presence of left artificial hip joint: Secondary | ICD-10-CM | POA: Diagnosis not present

## 2018-02-03 DIAGNOSIS — F419 Anxiety disorder, unspecified: Secondary | ICD-10-CM | POA: Diagnosis present

## 2018-02-03 DIAGNOSIS — Z818 Family history of other mental and behavioral disorders: Secondary | ICD-10-CM

## 2018-02-03 DIAGNOSIS — M48061 Spinal stenosis, lumbar region without neurogenic claudication: Secondary | ICD-10-CM | POA: Diagnosis present

## 2018-02-03 DIAGNOSIS — R69 Illness, unspecified: Secondary | ICD-10-CM | POA: Diagnosis not present

## 2018-02-03 DIAGNOSIS — M1612 Unilateral primary osteoarthritis, left hip: Principal | ICD-10-CM | POA: Diagnosis present

## 2018-02-03 DIAGNOSIS — Z96649 Presence of unspecified artificial hip joint: Secondary | ICD-10-CM

## 2018-02-03 HISTORY — PX: TOTAL HIP ARTHROPLASTY: SHX124

## 2018-02-03 LAB — TYPE AND SCREEN
ABO/RH(D): B POS
Antibody Screen: NEGATIVE

## 2018-02-03 SURGERY — ARTHROPLASTY, HIP, TOTAL, ANTERIOR APPROACH
Anesthesia: Spinal | Site: Hip | Laterality: Left

## 2018-02-03 MED ORDER — HYDROMORPHONE HCL 1 MG/ML IJ SOLN
INTRAMUSCULAR | Status: AC
  Filled 2018-02-03: qty 2

## 2018-02-03 MED ORDER — ONDANSETRON HCL 4 MG/2ML IJ SOLN
INTRAMUSCULAR | Status: DC | PRN
  Administered 2018-02-03: 4 mg via INTRAVENOUS

## 2018-02-03 MED ORDER — MIDAZOLAM HCL 5 MG/5ML IJ SOLN
INTRAMUSCULAR | Status: DC | PRN
  Administered 2018-02-03: 2 mg via INTRAVENOUS

## 2018-02-03 MED ORDER — ESOMEPRAZOLE MAGNESIUM 20 MG PO CPDR
20.0000 mg | DELAYED_RELEASE_CAPSULE | Freq: Every day | ORAL | Status: DC
  Administered 2018-02-04: 20 mg via ORAL
  Filled 2018-02-03 (×2): qty 1

## 2018-02-03 MED ORDER — ALBUTEROL SULFATE (2.5 MG/3ML) 0.083% IN NEBU: 2.5000 mg | INHALATION_SOLUTION | RESPIRATORY_TRACT | Status: DC | PRN

## 2018-02-03 MED ORDER — ROPINIROLE HCL 0.5 MG PO TABS
0.5000 mg | ORAL_TABLET | ORAL | Status: DC
  Administered 2018-02-04: 0.5 mg via ORAL
  Filled 2018-02-03: qty 1

## 2018-02-03 MED ORDER — CLONAZEPAM 1 MG PO TABS
1.0000 mg | ORAL_TABLET | Freq: Every day | ORAL | Status: DC
  Administered 2018-02-03: 1 mg via ORAL
  Filled 2018-02-03: qty 1

## 2018-02-03 MED ORDER — OXYCODONE HCL 5 MG PO TABS
5.0000 mg | ORAL_TABLET | ORAL | Status: DC | PRN
  Administered 2018-02-03 (×2): 5 mg via ORAL
  Filled 2018-02-03: qty 1

## 2018-02-03 MED ORDER — PHENOL 1.4 % MT LIQD: 1.0000 | OROMUCOSAL | Status: DC | PRN

## 2018-02-03 MED ORDER — OXYCODONE HCL 5 MG PO TABS
10.0000 mg | ORAL_TABLET | ORAL | Status: DC | PRN
  Administered 2018-02-04 (×2): 10 mg via ORAL
  Filled 2018-02-03 (×3): qty 2

## 2018-02-03 MED ORDER — GLYCOPYRROLATE 0.2 MG/ML IJ SOLN
INTRAMUSCULAR | Status: DC | PRN
  Administered 2018-02-03: 0.2 mg via INTRAVENOUS

## 2018-02-03 MED ORDER — ONDANSETRON HCL 4 MG PO TABS: 4.0000 mg | ORAL_TABLET | Freq: Four times a day (QID) | ORAL | Status: DC | PRN

## 2018-02-03 MED ORDER — LAMOTRIGINE 100 MG PO TABS
100.0000 mg | ORAL_TABLET | Freq: Two times a day (BID) | ORAL | Status: DC
  Administered 2018-02-03 – 2018-02-04 (×2): 100 mg via ORAL
  Filled 2018-02-03 (×2): qty 1

## 2018-02-03 MED ORDER — FENTANYL CITRATE (PF) 100 MCG/2ML IJ SOLN
INTRAMUSCULAR | Status: AC
  Filled 2018-02-03: qty 2

## 2018-02-03 MED ORDER — DEXAMETHASONE SODIUM PHOSPHATE 10 MG/ML IJ SOLN
10.0000 mg | Freq: Once | INTRAMUSCULAR | Status: AC
  Administered 2018-02-04: 10 mg via INTRAVENOUS
  Filled 2018-02-03: qty 1

## 2018-02-03 MED ORDER — METOCLOPRAMIDE HCL 5 MG/ML IJ SOLN: 5.0000 mg | Freq: Three times a day (TID) | INTRAMUSCULAR | Status: DC | PRN

## 2018-02-03 MED ORDER — POLYETHYLENE GLYCOL 3350 17 G PO PACK: 17.0000 g | PACK | Freq: Two times a day (BID) | ORAL | 0 refills | Status: DC

## 2018-02-03 MED ORDER — FERROUS SULFATE 325 (65 FE) MG PO TABS: 325.0000 mg | ORAL_TABLET | Freq: Three times a day (TID) | ORAL | 3 refills | Status: DC

## 2018-02-03 MED ORDER — TRANEXAMIC ACID 1000 MG/10ML IV SOLN
1000.0000 mg | Freq: Once | INTRAVENOUS | Status: AC
  Administered 2018-02-03: 1000 mg via INTRAVENOUS
  Filled 2018-02-03: qty 10

## 2018-02-03 MED ORDER — ONDANSETRON HCL 4 MG/2ML IJ SOLN: 4.0000 mg | Freq: Four times a day (QID) | INTRAMUSCULAR | Status: DC | PRN

## 2018-02-03 MED ORDER — DEXAMETHASONE SODIUM PHOSPHATE 10 MG/ML IJ SOLN
10.0000 mg | Freq: Once | INTRAMUSCULAR | Status: AC
  Administered 2018-02-03: 10 mg via INTRAVENOUS

## 2018-02-03 MED ORDER — CELECOXIB 200 MG PO CAPS
200.0000 mg | ORAL_CAPSULE | Freq: Two times a day (BID) | ORAL | Status: DC
  Administered 2018-02-03 – 2018-02-04 (×2): 200 mg via ORAL
  Filled 2018-02-03 (×2): qty 1

## 2018-02-03 MED ORDER — METHOCARBAMOL 500 MG PO TABS: 500.0000 mg | ORAL_TABLET | Freq: Four times a day (QID) | ORAL | 0 refills | Status: DC | PRN

## 2018-02-03 MED ORDER — ASPIRIN 81 MG PO CHEW
81.0000 mg | CHEWABLE_TABLET | Freq: Two times a day (BID) | ORAL | Status: DC
  Administered 2018-02-03 – 2018-02-04 (×2): 81 mg via ORAL
  Filled 2018-02-03 (×2): qty 1

## 2018-02-03 MED ORDER — ACETAMINOPHEN 500 MG PO TABS: 1000.0000 mg | ORAL_TABLET | Freq: Three times a day (TID) | ORAL | 0 refills | Status: AC

## 2018-02-03 MED ORDER — SODIUM CHLORIDE 0.9 % IV SOLN
INTRAVENOUS | Status: DC
  Administered 2018-02-03: 19:00:00 via INTRAVENOUS

## 2018-02-03 MED ORDER — DOCUSATE SODIUM 100 MG PO CAPS
100.0000 mg | ORAL_CAPSULE | Freq: Two times a day (BID) | ORAL | 0 refills | Status: DC
Start: 2018-02-03 — End: 2019-06-14

## 2018-02-03 MED ORDER — TOPIRAMATE 100 MG PO TABS
100.0000 mg | ORAL_TABLET | Freq: Two times a day (BID) | ORAL | Status: DC
  Administered 2018-02-03 – 2018-02-04 (×2): 100 mg via ORAL
  Filled 2018-02-03 (×2): qty 1

## 2018-02-03 MED ORDER — SODIUM CHLORIDE 0.9 % IR SOLN
Status: DC | PRN
  Administered 2018-02-03: 1000 mL

## 2018-02-03 MED ORDER — BISMUTH SUBSALICYLATE 262 MG/15ML PO SUSP: 30.0000 mL | Freq: Two times a day (BID) | ORAL | Status: DC | PRN

## 2018-02-03 MED ORDER — MAGNESIUM CITRATE PO SOLN: 1.0000 | Freq: Once | ORAL | Status: DC | PRN

## 2018-02-03 MED ORDER — DIPHENHYDRAMINE HCL 12.5 MG/5ML PO ELIX
12.5000 mg | ORAL_SOLUTION | ORAL | Status: DC | PRN
Start: 2018-02-03 — End: 2018-02-04

## 2018-02-03 MED ORDER — ALPRAZOLAM 0.5 MG PO TABS: 0.5000 mg | ORAL_TABLET | Freq: Every day | ORAL | Status: DC | PRN

## 2018-02-03 MED ORDER — METOCLOPRAMIDE HCL 5 MG PO TABS: 5.0000 mg | ORAL_TABLET | Freq: Three times a day (TID) | ORAL | Status: DC | PRN

## 2018-02-03 MED ORDER — FAMOTIDINE 20 MG PO TABS: 20.0000 mg | ORAL_TABLET | Freq: Every day | ORAL | Status: DC | PRN

## 2018-02-03 MED ORDER — LORATADINE 10 MG PO TABS
10.0000 mg | ORAL_TABLET | Freq: Every day | ORAL | Status: DC
  Administered 2018-02-03: 10 mg via ORAL
  Filled 2018-02-03: qty 1

## 2018-02-03 MED ORDER — MOMETASONE FURO-FORMOTEROL FUM 200-5 MCG/ACT IN AERO
2.0000 | INHALATION_SPRAY | Freq: Two times a day (BID) | RESPIRATORY_TRACT | Status: DC
  Administered 2018-02-04: 2 via RESPIRATORY_TRACT
  Filled 2018-02-03: qty 8.8

## 2018-02-03 MED ORDER — OXYCODONE HCL 5 MG PO TABS: 5.0000 mg | ORAL_TABLET | ORAL | 0 refills | Status: DC | PRN

## 2018-02-03 MED ORDER — MIDAZOLAM HCL 2 MG/2ML IJ SOLN
INTRAMUSCULAR | Status: AC
  Filled 2018-02-03: qty 2

## 2018-02-03 MED ORDER — FERROUS SULFATE 325 (65 FE) MG PO TABS
325.0000 mg | ORAL_TABLET | Freq: Three times a day (TID) | ORAL | Status: DC
  Administered 2018-02-04 (×2): 325 mg via ORAL
  Filled 2018-02-03 (×2): qty 1

## 2018-02-03 MED ORDER — MENTHOL 3 MG MT LOZG: 1.0000 | LOZENGE | OROMUCOSAL | Status: DC | PRN

## 2018-02-03 MED ORDER — MONTELUKAST SODIUM 10 MG PO TABS
10.0000 mg | ORAL_TABLET | Freq: Every day | ORAL | Status: DC
  Administered 2018-02-03: 10 mg via ORAL
  Filled 2018-02-03: qty 1

## 2018-02-03 MED ORDER — LACTATED RINGERS IV SOLN
INTRAVENOUS | Status: DC
  Administered 2018-02-03 (×2): via INTRAVENOUS

## 2018-02-03 MED ORDER — DEXTROSE 5 % IV SOLN
INTRAVENOUS | Status: DC | PRN
  Administered 2018-02-03: 25 ug/min via INTRAVENOUS

## 2018-02-03 MED ORDER — DOCUSATE SODIUM 100 MG PO CAPS
100.0000 mg | ORAL_CAPSULE | Freq: Two times a day (BID) | ORAL | Status: DC
  Administered 2018-02-03 – 2018-02-04 (×2): 100 mg via ORAL
  Filled 2018-02-03 (×2): qty 1

## 2018-02-03 MED ORDER — ASPIRIN 81 MG PO CHEW: 81.0000 mg | CHEWABLE_TABLET | Freq: Two times a day (BID) | ORAL | 0 refills | Status: AC

## 2018-02-03 MED ORDER — STERILE WATER FOR IRRIGATION IR SOLN
Status: DC | PRN
  Administered 2018-02-03: 2000 mL

## 2018-02-03 MED ORDER — CEFAZOLIN SODIUM-DEXTROSE 2-4 GM/100ML-% IV SOLN
2.0000 g | Freq: Four times a day (QID) | INTRAVENOUS | Status: AC
  Administered 2018-02-03 – 2018-02-04 (×2): 2 g via INTRAVENOUS
  Filled 2018-02-03 (×2): qty 100

## 2018-02-03 MED ORDER — BISACODYL 10 MG RE SUPP: 10.0000 mg | Freq: Every day | RECTAL | Status: DC | PRN

## 2018-02-03 MED ORDER — CHLORHEXIDINE GLUCONATE 4 % EX LIQD: 60.0000 mL | Freq: Once | CUTANEOUS | Status: DC

## 2018-02-03 MED ORDER — ALUM & MAG HYDROXIDE-SIMETH 200-200-20 MG/5ML PO SUSP: 15.0000 mL | ORAL | Status: DC | PRN

## 2018-02-03 MED ORDER — BUPIVACAINE IN DEXTROSE 0.75-8.25 % IT SOLN
INTRATHECAL | Status: DC | PRN
  Administered 2018-02-03: 1.8 mL via INTRATHECAL

## 2018-02-03 MED ORDER — PROPOFOL 10 MG/ML IV BOLUS
INTRAVENOUS | Status: AC
  Filled 2018-02-03: qty 20

## 2018-02-03 MED ORDER — HYDROMORPHONE HCL 1 MG/ML IJ SOLN
0.2500 mg | INTRAMUSCULAR | Status: DC | PRN
  Administered 2018-02-03 (×4): 0.5 mg via INTRAVENOUS

## 2018-02-03 MED ORDER — PROPOFOL 500 MG/50ML IV EMUL
INTRAVENOUS | Status: DC | PRN
  Administered 2018-02-03: 50 ug/kg/min via INTRAVENOUS

## 2018-02-03 MED ORDER — CEFAZOLIN SODIUM-DEXTROSE 2-4 GM/100ML-% IV SOLN
2.0000 g | INTRAVENOUS | Status: AC
  Administered 2018-02-03: 2 g via INTRAVENOUS
  Filled 2018-02-03: qty 100

## 2018-02-03 MED ORDER — METHOCARBAMOL 500 MG PO TABS
500.0000 mg | ORAL_TABLET | Freq: Four times a day (QID) | ORAL | Status: DC | PRN
  Administered 2018-02-04 (×2): 500 mg via ORAL
  Filled 2018-02-03 (×2): qty 1

## 2018-02-03 MED ORDER — ACETAMINOPHEN 500 MG PO TABS
1000.0000 mg | ORAL_TABLET | Freq: Four times a day (QID) | ORAL | Status: AC
  Administered 2018-02-03 – 2018-02-04 (×4): 1000 mg via ORAL
  Filled 2018-02-03 (×4): qty 2

## 2018-02-03 MED ORDER — POLYETHYLENE GLYCOL 3350 17 G PO PACK
17.0000 g | PACK | Freq: Two times a day (BID) | ORAL | Status: DC
  Administered 2018-02-03 – 2018-02-04 (×2): 17 g via ORAL
  Filled 2018-02-03 (×2): qty 1

## 2018-02-03 MED ORDER — FENTANYL CITRATE (PF) 100 MCG/2ML IJ SOLN
INTRAMUSCULAR | Status: DC | PRN
  Administered 2018-02-03: 50 ug via INTRAVENOUS

## 2018-02-03 MED ORDER — METHOCARBAMOL 1000 MG/10ML IJ SOLN
500.0000 mg | Freq: Four times a day (QID) | INTRAVENOUS | Status: DC | PRN
  Administered 2018-02-03: 500 mg via INTRAVENOUS
  Filled 2018-02-03: qty 550

## 2018-02-03 MED ORDER — HYDROMORPHONE HCL 1 MG/ML IJ SOLN
0.5000 mg | INTRAMUSCULAR | Status: DC | PRN
  Administered 2018-02-04: 0.5 mg via INTRAVENOUS
  Filled 2018-02-03: qty 1

## 2018-02-03 MED ORDER — PROMETHAZINE HCL 25 MG/ML IJ SOLN: 6.2500 mg | INTRAMUSCULAR | Status: DC | PRN

## 2018-02-03 MED ORDER — PROPOFOL 10 MG/ML IV BOLUS
INTRAVENOUS | Status: AC
  Filled 2018-02-03: qty 40

## 2018-02-03 SURGICAL SUPPLY — 38 items
ADH SKN CLS APL DERMABOND .7 (GAUZE/BANDAGES/DRESSINGS) ×1
BAG DECANTER FOR FLEXI CONT (MISCELLANEOUS) IMPLANT
BAG SPEC THK2 15X12 ZIP CLS (MISCELLANEOUS)
BAG ZIPLOCK 12X15 (MISCELLANEOUS) IMPLANT
BLADE SAG 18X100X1.27 (BLADE) ×2 IMPLANT
CAPT HIP TOTAL 2 ×1 IMPLANT
COVER PERINEAL POST (MISCELLANEOUS) ×2 IMPLANT
COVER SURGICAL LIGHT HANDLE (MISCELLANEOUS) ×2 IMPLANT
DERMABOND ADVANCED (GAUZE/BANDAGES/DRESSINGS) ×1
DERMABOND ADVANCED .7 DNX12 (GAUZE/BANDAGES/DRESSINGS) ×1 IMPLANT
DRAPE STERI IOBAN 125X83 (DRAPES) ×2 IMPLANT
DRAPE U-SHAPE 47X51 STRL (DRAPES) ×4 IMPLANT
DRESSING AQUACEL AG SP 3.5X10 (GAUZE/BANDAGES/DRESSINGS) ×1 IMPLANT
DRSG AQUACEL AG ADV 3.5X10 (GAUZE/BANDAGES/DRESSINGS) ×1 IMPLANT
DRSG AQUACEL AG SP 3.5X10 (GAUZE/BANDAGES/DRESSINGS) ×2
DURAPREP 26ML APPLICATOR (WOUND CARE) ×2 IMPLANT
ELECT REM PT RETURN 15FT ADLT (MISCELLANEOUS) ×2 IMPLANT
GLOVE BIOGEL M STRL SZ7.5 (GLOVE) IMPLANT
GLOVE BIOGEL PI IND STRL 7.5 (GLOVE) ×1 IMPLANT
GLOVE BIOGEL PI IND STRL 8.5 (GLOVE) ×1 IMPLANT
GLOVE BIOGEL PI INDICATOR 7.5 (GLOVE) ×1
GLOVE BIOGEL PI INDICATOR 8.5 (GLOVE) ×1
GLOVE ECLIPSE 8.0 STRL XLNG CF (GLOVE) ×4 IMPLANT
GLOVE ORTHO TXT STRL SZ7.5 (GLOVE) ×2 IMPLANT
GOWN STRL REUS W/TWL 2XL LVL3 (GOWN DISPOSABLE) ×2 IMPLANT
GOWN STRL REUS W/TWL LRG LVL3 (GOWN DISPOSABLE) ×2 IMPLANT
HOLDER FOLEY CATH W/STRAP (MISCELLANEOUS) ×2 IMPLANT
PACK ANTERIOR HIP CUSTOM (KITS) ×2 IMPLANT
SUT ETHILON 2 0 PS N (SUTURE) ×1 IMPLANT
SUT MNCRL AB 4-0 PS2 18 (SUTURE) ×2 IMPLANT
SUT STRATAFIX 0 PDS 27 VIOLET (SUTURE) ×2
SUT VIC AB 1 CT1 36 (SUTURE) ×6 IMPLANT
SUT VIC AB 2-0 CT1 27 (SUTURE) ×6
SUT VIC AB 2-0 CT1 TAPERPNT 27 (SUTURE) ×2 IMPLANT
SUTURE STRATFX 0 PDS 27 VIOLET (SUTURE) ×1 IMPLANT
TRAY FOLEY MTR SLVR 16FR STAT (SET/KITS/TRAYS/PACK) IMPLANT
WATER STERILE IRR 1000ML POUR (IV SOLUTION) ×2 IMPLANT
YANKAUER SUCT BULB TIP 10FT TU (MISCELLANEOUS) IMPLANT

## 2018-02-03 NOTE — Interval H&P Note (Signed)
History and Physical Interval Note:  02/03/2018 12:08 PM  Cheryl Hoover  has presented today for surgery, with the diagnosis of Left hip osteoarthritis  The various methods of treatment have been discussed with the patient and family. After consideration of risks, benefits and other options for treatment, the patient has consented to  Procedure(s) with comments: LEFT TOTAL HIP ARTHROPLASTY ANTERIOR APPROACH (Left) - 70 mins as a surgical intervention .  The patient's history has been reviewed, patient examined, no change in status, stable for surgery.  I have reviewed the patient's chart and labs.  Questions were answered to the patient's satisfaction.     Shelda PalMatthew D 

## 2018-02-03 NOTE — Anesthesia Postprocedure Evaluation (Signed)
Anesthesia Post Note  Patient: Cheryl Hoover  Procedure(s) Performed: LEFT TOTAL HIP ARTHROPLASTY ANTERIOR APPROACH (Left Hip)     Patient location during evaluation: PACU Anesthesia Type: Spinal Level of consciousness: oriented and awake and alert Pain management: pain level controlled Vital Signs Assessment: post-procedure vital signs reviewed and stable Respiratory status: spontaneous breathing, respiratory function stable and patient connected to nasal cannula oxygen Cardiovascular status: blood pressure returned to baseline and stable Postop Assessment: no headache, no backache and no apparent nausea or vomiting Anesthetic complications: no    Last Vitals:  Vitals:   02/03/18 1730 02/03/18 1744  BP: (!) 107/96 106/77  Pulse: 74 66  Resp: 14 10  Temp:  (!) 36.3 C  SpO2: 100% 100%    Last Pain:  Vitals:   02/03/18 1744  TempSrc:   PainSc: 3     LLE Motor Response: Purposeful movement;Responds to commands (02/03/18 1744) LLE Sensation: Numbness;Tingling (02/03/18 1744) RLE Motor Response: Purposeful movement;Responds to commands (02/03/18 1744) RLE Sensation: Numbness;Tingling (02/03/18 1744) L Sensory Level: L4-Anterior knee, lower leg (02/03/18 1744) R Sensory Level: L4-Anterior knee, lower leg (02/03/18 1744)  , S

## 2018-02-03 NOTE — Anesthesia Procedure Notes (Signed)
Date/Time: 02/03/2018 2:38 PM Performed by: Thornell MuleStubblefield,  G, CRNA Oxygen Delivery Method: Simple face mask

## 2018-02-03 NOTE — Anesthesia Preprocedure Evaluation (Addendum)
Anesthesia Evaluation  Patient identified by MRN, date of birth, ID band Patient awake    Reviewed: Allergy & Precautions, NPO status , Patient's Chart, lab work & pertinent test results  Airway Mallampati: II  TM Distance: >3 FB Neck ROM: Full    Dental no notable dental hx.    Pulmonary neg pulmonary ROS, former smoker,    Pulmonary exam normal breath sounds clear to auscultation       Cardiovascular hypertension, Normal cardiovascular exam Rhythm:Regular Rate:Normal     Neuro/Psych Bipolar Disorder negative neurological ROS     GI/Hepatic Neg liver ROS, GERD  ,  Endo/Other  negative endocrine ROS  Renal/GU negative Renal ROS  negative genitourinary   Musculoskeletal negative musculoskeletal ROS (+)   Abdominal   Peds negative pediatric ROS (+)  Hematology negative hematology ROS (+)   Anesthesia Other Findings   Reproductive/Obstetrics negative OB ROS                           Anesthesia Physical Anesthesia Plan  ASA: II  Anesthesia Plan: Spinal   Post-op Pain Management:    Induction: Intravenous  PONV Risk Score and Plan: Ondansetron, Dexamethasone and Treatment may vary due to age or medical condition  Airway Management Planned: Simple Face Mask  Additional Equipment:   Intra-op Plan:   Post-operative Plan:   Informed Consent: I have reviewed the patients History and Physical, chart, labs and discussed the procedure including the risks, benefits and alternatives for the proposed anesthesia with the patient or authorized representative who has indicated his/her understanding and acceptance.   Dental advisory given  Plan Discussed with: CRNA and Surgeon  Anesthesia Plan Comments:         Anesthesia Quick Evaluation

## 2018-02-03 NOTE — Discharge Instructions (Signed)

## 2018-02-03 NOTE — Op Note (Signed)
NAME:  Cheryl Hoover                ACCOUNT NO.: [id number]      MEDICAL RECORD NO.: [id number]      FACILITY:  [facility]      PHYSICIAN:  Shelda Pal  DATE OF BIRTH:  07/09/1956     DATE OF PROCEDURE:  02/03/2018                                 OPERATIVE REPORT         PREOPERATIVE DIAGNOSIS: Left  hip osteoarthritis.      POSTOPERATIVE DIAGNOSIS:  Left hip osteoarthritis.      PROCEDURE:  Left total hip replacement through an anterior approach   utilizing DePuy THR system, component size 52mm pinnacle cup, a size 36+4 neutral   Altrex liner, a size 4 Hi Tri Lock stem with a 36+1.5 delta ceramic   ball.      SURGEON:  Cheryl Hoover. Cheryl Hoover, M.D.      ASSISTANT:  Skip Mayer, PA-C     ANESTHESIA:  Spinal.      SPECIMENS:  None.      COMPLICATIONS:  None.      BLOOD LOSS:  350 cc     DRAINS:  None.      INDICATION OF THE PROCEDURE:  Cheryl Hoover is a 62 y.o. female who had   presented to office for evaluation of left hip pain.  Radiographs revealed   progressive degenerative changes with bone-on-bone   articulation of the  hip joint, including subchondral cystic changes and osteophytes.  The patient had painful limited range of   motion significantly affecting their overall quality of life and function.  The patient was failing to    respond to conservative measures including medications and/or injections and activity modification and at this point was ready   to proceed with more definitive measures.  Consent was obtained for   benefit of pain relief.  Specific risks of infection, DVT, component   failure, dislocation, neurovascular injury, and need for revision surgery were reviewed in the office as well discussion of   the anterior versus posterior approach were reviewed.     PROCEDURE IN DETAIL:  The patient was brought to operative theater.   Once adequate anesthesia, preoperative antibiotics, 2 gm of Ancef, 1 gm of Tranexamic Acid, and  10 mg of Decadron were administered, the patient was positioned supine on the [employer] table.  Once the patient was safely positioned with adequate padding of boney prominences we predraped out the hip, and used fluoroscopy to confirm orientation of the pelvis.      The left hip was then prepped and draped from proximal iliac crest to   mid thigh with a shower curtain technique.      Time-out was performed identifying the patient, planned procedure, and the appropriate extremity.     An incision was then made 2 cm lateral to the   anterior superior iliac spine extending over the orientation of the   tensor fascia lata muscle and sharp dissection was carried down to the   fascia of the muscle.      The fascia was then incised.  The muscle belly was identified and swept   laterally and retractor placed along the superior neck.  Following   cauterization of the circumflex vessels and removing some  pericapsular   fat, a second cobra retractor was placed on the inferior neck.  A T-capsulotomy was made along the line of the   superior neck to the trochanteric fossa, then extended proximally and   distally.  Tag sutures were placed and the retractors were then placed   intracapsular.  We then identified the trochanteric fossa and   orientation of my neck cut and then made a neck osteotomy with the femur on traction.  The femoral   head was removed without difficulty or complication.  Traction was let   off and retractors were placed posterior and anterior around the   acetabulum.      The labrum and foveal tissue were debrided.  I began reaming with a 46 mm   reamer and reamed up to 51 mm reamer with good bony bed preparation and a 52 mm  cup was chosen.  The final 52 mm Pinnacle cup was then impacted under fluoroscopy to confirm the depth of penetration and orientation with respect to   Abduction and forward flexion.  A screw was placed into the ilium followed by the hole eliminator.  The final    36+4 neutral Altrex liner was impacted with good visualized rim fit.  The cup was positioned anatomically within the acetabular portion of the pelvis.      At this point, the femur was rolled to 100 degrees.  Further capsule was   released off the inferior aspect of the femoral neck.  I then   released the superior capsule proximally.  With the leg in a neutral position the hook was placed laterally   along the femur under the vastus lateralis origin and elevated manually and then held in position using the hook attachment on the bed.  The leg was then extended and adducted with the leg rolled to 100   degrees of external rotation.  Retractors were placed along the medial calcar and posteriorly over the greater trochanter.  Once the proximal femur was fully   exposed, I used a box osteotome to set orientation.  I then began   broaching with the starting chili pepper broach and passed this by hand and then broached up to 4 (matching that on the other side).  With the 4 broach in place I chose a high offset neck and did several trial reductions.  The offset was appropriate, leg lengths   appeared to be equal best matched with the 36+1.5 head ball trial confirmed radiographically.   Given these findings, I went ahead and dislocated the hip, repositioned all   retractors and positioned the right hip in the extended and abducted position.  The final 4 Hi Tri Lock stem was   chosen and it was impacted down to the level of neck cut.  Based on this   and the trial reductions, a final 36+1.5 delta ceramic ball was chosen and   impacted onto a clean and dry trunnion, and the hip was reduced.  The   hip had been irrigated throughout the case again at this point.  I did   reapproximate the superior capsular leaflet to the anterior leaflet   using #1 Vicryl.  The fascia of the   tensor fascia lata muscle was then reapproximated using #1 Vicryl and #0 Stratafix sutures.  The   remaining wound was closed  with 2-0 Vicryl and running 4-0 Monocryl.   The hip was cleaned, dried, and dressed sterilely using Dermabond and   Aquacel dressing.  The  patient was then brought   to recovery room in stable condition tolerating the procedure well.    Skip MayerBlair Roberts, PA-C was present for the entirety of the case involved from   preoperative positioning, perioperative retractor management, general   facilitation of the case, as well as primary wound closure as assistant.            Cheryl FrankelMatthew D. Cheryl Boxerlin, M.D.        02/03/2018 3:56 PM

## 2018-02-03 NOTE — Anesthesia Procedure Notes (Signed)
Spinal  Patient location during procedure: OR Start time: 02/03/2018 2:33 PM End time: 02/03/2018 2:43 PM Staffing Anesthesiologist: Myrtie Soman, MD Performed: anesthesiologist  Preanesthetic Checklist Completed: patient identified, site marked, surgical consent, pre-op evaluation, timeout performed, IV checked, risks and benefits discussed and monitors and equipment checked Spinal Block Patient position: sitting Prep: Betadine Patient monitoring: heart rate, continuous pulse ox and blood pressure Location: L3-4 Injection technique: single-shot Needle Needle type: Sprotte  Needle gauge: 24 G Needle length: 9 cm Additional Notes Expiration date of kit checked and confirmed. Patient tolerated procedure well, without complications.

## 2018-02-03 NOTE — Transfer of Care (Signed)
Immediate Anesthesia Transfer of Care Note  Patient: Cheryl Hoover  Procedure(s) Performed: LEFT TOTAL HIP ARTHROPLASTY ANTERIOR APPROACH (Left Hip)  Patient Location: PACU  Anesthesia Type:Spinal  Level of Consciousness: awake, alert  and oriented  Airway & Oxygen Therapy: Patient Spontanous Breathing and Patient connected to face mask oxygen  Post-op Assessment: Report given to RN and Post -op Vital signs reviewed and stable  Post vital signs: Reviewed and stable  Last Vitals:  Vitals Value Taken Time  BP 148/72 02/03/2018  4:30 PM  Temp    Pulse 54 02/03/2018  4:31 PM  Resp 15 02/03/2018  4:31 PM  SpO2 100 % 02/03/2018  4:31 PM  Vitals shown include unvalidated device data.  Last Pain:  Vitals:   02/03/18 1202  TempSrc:   PainSc: 0-No pain      Patients Stated Pain Goal: 4 (02/03/18 1202)  Complications: No apparent anesthesia complications

## 2018-02-04 ENCOUNTER — Encounter (HOSPITAL_COMMUNITY): Payer: Self-pay | Admitting: Orthopedic Surgery

## 2018-02-04 DIAGNOSIS — E669 Obesity, unspecified: Secondary | ICD-10-CM | POA: Diagnosis present

## 2018-02-04 LAB — CBC
HCT: 33.5 % — ABNORMAL LOW (ref 36.0–46.0)
Hemoglobin: 10.9 g/dL — ABNORMAL LOW (ref 12.0–15.0)
MCH: 31.1 pg (ref 26.0–34.0)
MCHC: 32.5 g/dL (ref 30.0–36.0)
MCV: 95.7 fL (ref 78.0–100.0)
PLATELETS: 188 10*3/uL (ref 150–400)
RBC: 3.5 MIL/uL — AB (ref 3.87–5.11)
RDW: 12.6 % (ref 11.5–15.5)
WBC: 7.8 10*3/uL (ref 4.0–10.5)

## 2018-02-04 LAB — BASIC METABOLIC PANEL
Anion gap: 7 (ref 5–15)
BUN: 22 mg/dL — AB (ref 6–20)
CALCIUM: 8.7 mg/dL — AB (ref 8.9–10.3)
CO2: 22 mmol/L (ref 22–32)
Chloride: 113 mmol/L — ABNORMAL HIGH (ref 101–111)
Creatinine, Ser: 1.1 mg/dL — ABNORMAL HIGH (ref 0.44–1.00)
GFR calc Af Amer: >60 mL/min (ref >60)
GFR, EST NON AFRICAN AMERICAN: 53 mL/min — AB (ref >60)
GLUCOSE: 121 mg/dL — AB (ref 65–99)
POTASSIUM: 4.1 mmol/L (ref 3.5–5.1)
Sodium: 142 mmol/L (ref 135–145)

## 2018-02-04 MED ORDER — FAMOTIDINE 20 MG PO TABS: 20.0000 mg | ORAL_TABLET | Freq: Every day | ORAL | Status: DC | PRN

## 2018-02-04 MED ORDER — ALUM & MAG HYDROXIDE-SIMETH 200-200-20 MG/5ML PO SUSP: 15.0000 mL | ORAL | Status: DC | PRN

## 2018-02-04 MED ORDER — BISMUTH SUBSALICYLATE 262 MG/15ML PO SUSP: 30.0000 mL | Freq: Two times a day (BID) | ORAL | Status: DC | PRN

## 2018-02-04 NOTE — Progress Notes (Signed)
     Subjective: 1 Day Post-Op Procedure(s) (LRB): LEFT TOTAL HIP ARTHROPLASTY ANTERIOR APPROACH (Left)   Patient reports pain as mild, slightly more pain than after her last surgery. Feels that she is doing well otherwise.  No events throughout the night. Looking forward to getting better.  Ready to be discharged home if she does well with PT.    Patient's anticipated LOS is less than 2 midnights, meeting these requirements: - Younger than [id number] - Lives within 1 hour of care - Has a competent adult at home to recover with post-op recover - NO history of  - Chronic pain requiring opiods  - Diabetes  - Coronary Artery Disease  - Heart failure  - Heart attack  - Stroke  - DVT/VTE  - Cardiac arrhythmia  - Respiratory Failure/COPD  - Renal failure  - Anemia  - Advanced Liver disease       Objective:   VITALS:   Vitals:   02/04/18 0132 02/04/18 0618  BP: 114/67 114/68  Pulse: 71 74  Resp: 15   Temp: 97.7 F (36.5 C) 97.7 F (36.5 C)  SpO2: 97% 100%    Dorsiflexion/Plantar flexion intact Incision: dressing C/D/I No cellulitis present Compartment soft  LABS Recent Labs    02/04/18 0531  HGB 10.9*  HCT 33.5*  WBC 7.8  PLT 188    Recent Labs    02/04/18 0531  NA 142  K 4.1  BUN 22*  CREATININE 1.10*  GLUCOSE 121*     Assessment/Plan: 1 Day Post-Op Procedure(s) (LRB): LEFT TOTAL HIP ARTHROPLASTY ANTERIOR APPROACH (Left) Foley cath d/c'ed Advance diet Up with therapy D/C IV fluids Discharge home [facility]). Follow up with OLIN, D in 2 weeks.  Contact information:  [facility]) [address] [place] [id number] [phone]    Obese (BMI 30-39.9) Estimated body mass index is 30.02 kg/m as calculated from the following:   Height as of this encounter: 5\' 6"  (1.676 m).   Weight as of this encounter: 84.4 kg (186 lb). Patient  also counseled that weight may inhibit the healing process Patient counseled that losing weight will help with future health issues        Anastasio AuerbachMatthew S.    PAC  02/04/2018, 8:26 AM

## 2018-02-04 NOTE — Evaluation (Signed)
Physical Therapy Evaluation Patient Details Name: Cheryl Hoover MRN: [id number] DOB: 05/02/1956 Today's Date: 02/04/2018   History of Present Illness  Pt s/p L THR and with hx of R THR and bipolar  Clinical Impression  Pt s/p L THR and presents with decreased L LE strength/ROM and post op pain limiting functional mobility.  Pt should progress to dc home with family assist.  This session pt c/o dizziness following ambulation - BP 121/70, SaO2 99% - RN aware.    Follow Up Recommendations Follow surgeon's recommendation for DC plan and follow-up therapies    Equipment Recommendations  None recommended by PT    Recommendations for Other Services       Precautions / Restrictions Precautions Precautions: Fall Restrictions Weight Bearing Restrictions: No Other Position/Activity Restrictions: WBAT      Mobility  Bed Mobility Overal bed mobility: Needs Assistance Bed Mobility: Supine to Sit     Supine to sit: Min assist     General bed mobility comments: cues for sequence and use of R LE to self assist  Transfers Overall transfer level: Needs assistance Equipment used: Rolling walker (2 wheeled) Transfers: Sit to/from Stand Sit to Stand: Min assist;Min guard         General transfer comment: cues for LE management and use of UEs to self assist  Ambulation/Gait Ambulation/Gait assistance: Min assist;Min guard Ambulation Distance (Feet): 40 Feet Assistive device: Rolling walker (2 wheeled) Gait Pattern/deviations: Step-to pattern;Decreased step length - right;Decreased step length - left;Shuffle;Trunk flexed Gait velocity: decr   General Gait Details: cues for sequence, posture and position from [employer]            Wheelchair Mobility    Modified Rankin (Stroke Patients Only)       Balance Overall balance assessment: Needs assistance Sitting-balance support: No upper extremity supported;Feet supported Sitting balance-Leahy Scale: Good      Standing balance support: Bilateral upper extremity supported Standing balance-Leahy Scale: Fair                               Pertinent Vitals/Pain Pain Assessment: 0-10 Pain Score: 5  Pain Location: My butt hurts Pain Descriptors / Indicators: Aching;Sore Pain Intervention(s): Limited activity within patient's tolerance;Monitored during session;Premedicated before session;Ice applied    Home Living Family/patient expects to be discharged to:: Private residence Living Arrangements: Spouse/significant other Available Help at Discharge: Family Type of Home: House Home Access: Stairs to enter Entrance Stairs-Rails: Right;Left;Can reach both Entrance Stairs-Number of Steps: 4 Home Layout: One level Home Equipment: Toilet riser;Tub bench;Walker - 2 wheels;Cane - single point      Prior Function Level of Independence: Independent with assistive device(s)         Comments: using 2 canes for ambulation     Hand Dominance        Extremity/Trunk Assessment   Upper Extremity Assessment Upper Extremity Assessment: Overall WFL for tasks assessed    Lower Extremity Assessment Lower Extremity Assessment: LLE deficits/detail LLE Deficits / Details: 2/5 strength at hip with AAROM at hip to 85 flex and 15 abd       Communication   Communication: No difficulties  Cognition Arousal/Alertness: Awake/alert Behavior During Therapy: WFL for tasks assessed/performed Overall Cognitive Status: Within Functional Limits for tasks assessed  General Comments      Exercises Total Joint Exercises Ankle Circles/Pumps: AROM;Both;15 reps;Supine Quad Sets: AROM;Both;10 reps;Supine Heel Slides: AAROM;Left;20 reps;Supine Hip ABduction/ADduction: AAROM;Left;15 reps;Supine   Assessment/Plan    PT Assessment Patient needs continued PT services  PT Problem List Decreased strength;Decreased range of motion;Decreased  activity tolerance;Decreased mobility;Decreased balance;Decreased knowledge of use of DME;Pain       PT Treatment Interventions DME instruction;Gait training;Stair training;Functional mobility training;Therapeutic activities;Therapeutic exercise;Patient/family education    PT Goals (Current goals can be found in the Care Plan section)  Acute Rehab PT Goals Patient Stated Goal: Regain IND and walk without pain PT Goal Formulation: With patient Time For Goal Achievement: 02/11/18 Potential to Achieve Goals: Good    Frequency 7X/week   Barriers to discharge        Co-evaluation               AM-PAC PT "6 Clicks" Daily Activity  Outcome Measure Difficulty turning over in bed (including adjusting bedclothes, sheets and blankets)?: A Lot Difficulty moving from lying on back to sitting on the side of the bed? : A Lot Difficulty sitting down on and standing up from a chair with arms (e.g., wheelchair, bedside commode, etc,.)?: A Lot Help needed moving to and from a bed to chair (including a wheelchair)?: A Little Help needed walking in hospital room?: A Little Help needed climbing 3-5 steps with a railing? : A Little 6 Click Score: 15    End of Session Equipment Utilized During Treatment: Gait belt Activity Tolerance: Patient tolerated treatment well Patient left: in chair;with call bell/phone within reach Nurse Communication: Mobility status PT Visit Diagnosis: Difficulty in walking, not elsewhere classified (R26.2)    Time: [id number] PT Time Calculation (min) (ACUTE ONLY): 38 min   Charges:   PT Evaluation $PT Eval Low Complexity: 1 Low PT Treatments $Gait Training: 8-22 mins $Therapeutic Exercise: 8-22 mins   PT G Codes:        Pg [phone]   , 02/04/2018, 12:47 PM

## 2018-02-04 NOTE — Progress Notes (Signed)
Discharge instructions and prescriptions were discussed/given to pt and pt's partner. Pt and pt's partner report no further questions or concerns.

## 2018-02-04 NOTE — Progress Notes (Signed)
Physical Therapy Treatment Patient Details Name: Cheryl Hoover MRN: [id number] DOB: [birth year] Today's Date: 02/04/2018    History of Present Illness Pt s/p L THR and with hx of R THR and bipolar    PT Comments    Pt progressing well with mobility and eager for dc home.  Spouse present and reviewed car transfers, stairs and home therex program with progression and written instruction provided.   Follow Up Recommendations  Follow surgeon's recommendation for DC plan and follow-up therapies     Equipment Recommendations  None recommended by PT    Recommendations for Other Services       Precautions / Restrictions Precautions Precautions: Fall Restrictions Weight Bearing Restrictions: No Other Position/Activity Restrictions: WBAT    Mobility  Bed Mobility               General bed mobility comments: Pt up in chair and requests back to same  Transfers Overall transfer level: Needs assistance Equipment used: Rolling walker (2 wheeled) Transfers: Sit to/from Stand Sit to Stand: Min guard         General transfer comment: cues for LE management and use of UEs to self assist  Ambulation/Gait Ambulation/Gait assistance: Min guard;Supervision Ambulation Distance (Feet): 130 Feet Assistive device: Rolling walker (2 wheeled) Gait Pattern/deviations: Step-to pattern;Decreased step length - right;Decreased step length - left;Shuffle;Trunk flexed Gait velocity: decr   General Gait Details: cues for sequence, posture and position from RW   Stairs Stairs: Yes Stairs assistance: Min assist Stair Management: Two rails;Step to pattern;Forwards Number of Stairs: 3 General stair comments: cues for sequence and foot placement   Wheelchair Mobility    Modified Rankin (Stroke Patients Only)       Balance Overall balance assessment: Needs assistance Sitting-balance support: No upper extremity supported;Feet supported Sitting balance-Leahy Scale: Good      Standing balance support: Bilateral upper extremity supported Standing balance-Leahy Scale: Fair                              Cognition Arousal/Alertness: Awake/alert Behavior During Therapy: WFL for tasks assessed/performed Overall Cognitive Status: Within Functional Limits for tasks assessed                                        Exercises Total Joint Exercises Ankle Circles/Pumps: AROM;Both;15 reps;Supine Quad Sets: AROM;Both;10 reps;Supine Heel Slides: AAROM;Left;20 reps;Supine Hip ABduction/ADduction: AAROM;Left;15 reps;Supine Long Arc Quad: AROM;Left;10 reps;Seated    General Comments        Pertinent Vitals/Pain Pain Assessment: 0-10 Pain Score: 4  Pain Location: L hip Pain Descriptors / Indicators: Aching;Sore Pain Intervention(s): Limited activity within patient's tolerance;Monitored during session;Premedicated before session    Home Living                      Prior Function            PT Goals (current goals can now be found in the care plan section) Acute Rehab PT Goals Patient Stated Goal: Regain IND and walk without pain PT Goal Formulation: With patient Time For Goal Achievement: 02/11/18 Potential to Achieve Goals: Good Progress towards PT goals: Progressing toward goals    Frequency    7X/week      PT Plan Current plan remains appropriate    Co-evaluation  AM-PAC PT "6 Clicks" Daily Activity  Outcome Measure  Difficulty turning over in bed (including adjusting bedclothes, sheets and blankets)?: A Lot Difficulty moving from lying on back to sitting on the side of the bed? : A Lot Difficulty sitting down on and standing up from a chair with arms (e.g., wheelchair, bedside commode, etc,.)?: A Lot Help needed moving to and from a bed to chair (including a wheelchair)?: A Little Help needed walking in hospital room?: A Little Help needed climbing 3-5 steps with a railing? : A Little 6  Click Score: 15    End of Session Equipment Utilized During Treatment: Gait belt Activity Tolerance: Patient tolerated treatment well Patient left: in chair;with call bell/phone within reach;with family/visitor present Nurse Communication: Mobility status PT Visit Diagnosis: Difficulty in walking, not elsewhere classified (R26.2)     Time: [id number] PT Time Calculation (min) (ACUTE ONLY): 34 min  Charges:  $Gait Training: 8-22 mins $Therapeutic Exercise: 8-22 mins                    G Codes:       Pg [phone]    , 02/04/2018, 4:12 PM

## 2018-02-09 NOTE — Discharge Summary (Signed)
Physician Discharge Summary  Patient ID: Cheryl Hoover MRN: [id number] DOB/AGE: [birth year] 62 y.o.  Admit date: 02/03/2018 Discharge date: 02/04/2018   Procedures:  Procedure(s) (LRB): LEFT TOTAL HIP ARTHROPLASTY ANTERIOR APPROACH (Left)  Attending Physician:  Dr. Durene Romans Olin   Admission Diagnoses:   Left hip primary OA / pain  Discharge Diagnoses:  Active Problems:   S/P right THA, AA   Obese  Past Medical History:  Diagnosis Date  . Allergy   . Anemia   . Anxiety   . Arthritis   . Asthma   . Bipolar disorder (HCC)   . Complication of anesthesia    woke up during surgery   . Depression   . GERD (gastroesophageal reflux disease)   . Hypertension   . Scoliosis     HPI:    Cheryl Hoover, 62 y.o. female, has a history of pain and functional disability in the left hip(s) due to arthritis and patient has failed non-surgical conservative treatments for greater than 12 weeks to include NSAID's and/or analgesics, corticosteriod injections, use of assistive devices and activity modification.  Onset of symptoms was abrupt starting in Janurary 2019 with rapidlly worsening course since that time.The patient noted no past surgery on the left hip(s).  Patient currently rates pain in the left hip at 10 out of 10 with activity. Patient has night pain, worsening of pain with activity and weight bearing, trendelenberg gait, pain that interfers with activities of daily living and pain with passive range of motion. Patient has evidence of periarticular osteophytes and joint space narrowing by imaging studies. This condition presents safety issues increasing the risk of falls. There is no current active infection.  Risks, benefits and expectations were discussed with the patient.  Risks including but not limited to the risk of anesthesia, blood clots, nerve damage, blood vessel damage, failure of the prosthesis, infection and up to and including death.  Patient understand the risks,  benefits and expectations and wishes to proceed with surgery.   PCP: Avis EpleyJackson, Samantha J, PA-C   Discharged Condition: good  Hospital Course:  Patient underwent the above stated procedure on 02/03/2018. Patient tolerated the procedure well and brought to the recovery room in good condition and subsequently to the floor.  POD #1 BP: 114/68 ; Pulse: 74 ; Temp: 97.7 F (36.5 C) ; Resp: 15 Patient reports pain as mild, slightly more pain than after her last surgery. Feels that she is doing well otherwise.  No events throughout the night. Looking forward to getting better.  Ready to be discharged home. Dorsiflexion/plantar flexion intact, incision: dressing C/D/I, no cellulitis present and compartment soft.   LABS  Basename    HGB     10.9  HCT     33.5    Discharge Exam: General appearance: alert, cooperative and no distress Extremities: Homans sign is negative, no sign of DVT, no edema, redness or tenderness in the calves or thighs and no ulcers, gangrene or trophic changes  Disposition:  Home with follow up in 2 weeks   Follow-up Information    Durene Romanslin, , MD. Schedule an appointment as soon as possible for a visit in 2 weeks.   Specialty:  Orthopedic Surgery Contact information: [address] [place] [id number] [phone]           Discharge Instructions    Call MD / Call 911   Complete by:  As directed    If you experience chest pain or shortness of breath,  CALL 911 and be transported to the hospital emergency room.  If you develope a fever above 101 F, pus (white drainage) or increased drainage or redness at the wound, or calf pain, call your surgeon's office.   Change dressing   Complete by:  As directed    Maintain surgical dressing until follow up in the clinic. If the edges start to pull up, may reinforce with tape. If the dressing is no longer working, may remove and cover with gauze and tape, but must keep the area dry and clean.  Call with  any questions or concerns.   Constipation Prevention   Complete by:  As directed    Drink plenty of fluids.  Prune juice may be helpful.  You may use a stool softener, such as Colace (over the counter) 100 mg twice a day.  Use MiraLax (over the counter) for constipation as needed.   Diet - low sodium heart healthy   Complete by:  As directed    Discharge instructions   Complete by:  As directed    Maintain surgical dressing until follow up in the clinic. If the edges start to pull up, may reinforce with tape. If the dressing is no longer working, may remove and cover with gauze and tape, but must keep the area dry and clean.  Follow up in 2 weeks at [facility]. Call with any questions or concerns.   Increase activity slowly as tolerated   Complete by:  As directed    Weight bearing as tolerated with assist device (walker, cane, etc) as directed, use it as long as suggested by your surgeon or therapist, typically at least 4-6 weeks.   TED hose   Complete by:  As directed    Use stockings (TED hose) for 2 weeks on both leg(s).  You may remove them at night for sleeping.      Allergies as of 02/04/2018      Reactions   Avocado Itching   Penicillins Other (See Comments)   Unknown,childhood allergy  Has patient had a PCN reaction causing immediate rash, facial/tongue/throat swelling, SOB or lightheadedness with hypotension: Unknown Has patient had a PCN reaction causing severe rash involving mucus membranes or skin necrosis: Unknown Has patient had a PCN reaction that required hospitalization: Unknown Has patient had a PCN reaction occurring within the last 10 years: No If all of the above answers are "NO", then may proceed with Cephalosporin use.      Medication List    STOP taking these medications   HYDROcodone-acetaminophen 10-325 MG tablet Commonly known as:  NORCO   meloxicam 15 MG tablet Commonly known as:  MOBIC     TAKE these medications   acetaminophen 500 MG  tablet Commonly known as:  TYLENOL Take 2 tablets (1,000 mg total) by mouth every 8 (eight) hours. What changed:    medication strength  how much to take  when to take this  reasons to take this   albuterol 108 (90 Base) MCG/ACT inhaler Commonly known as:  VENTOLIN HFA Inhale 2 puffs into the lungs every 4 (four) hours as needed for wheezing. What changed:  reasons to take this   ALPRAZolam 0.5 MG tablet Commonly known as:  XANAX Take 0.5 mg by mouth daily as needed for anxiety or sleep.   aspirin 81 MG chewable tablet Commonly known as:  ASPIRIN CHILDRENS Chew 1 tablet (81 mg total) by mouth 2 (two) times daily. Take for 4 weeks, then resume regular dose.  bismuth subsalicylate 262 MG/15ML suspension Commonly known as:  PEPTO BISMOL Take 30 mLs by mouth 2 (two) times daily as needed for indigestion or diarrhea or loose stools.   clonazePAM 0.5 MG tablet Commonly known as:  KLONOPIN Take 1 mg by mouth at bedtime.   docusate sodium 100 MG capsule Commonly known as:  COLACE Take 1 capsule (100 mg total) by mouth 2 (two) times daily. What changed:  when to take this   esomeprazole 20 MG capsule Commonly known as:  NEXIUM Take 20 mg by mouth at bedtime.   ferrous sulfate 325 (65 FE) MG tablet Commonly known as:  FERROUSUL Take 1 tablet (325 mg total) by mouth 3 (three) times daily with meals.   Fluticasone-Salmeterol 250-50 MCG/DOSE Aepb Commonly known as:  ADVAIR DISKUS INHALE ONE PUFF BY MOUTH TWICE DAILY What changed:    how much to take  how to take this  when to take this  reasons to take this  additional instructions   lamoTRIgine 100 MG tablet Commonly known as:  LAMICTAL Take 100 mg by mouth 2 (two) times daily.   lisinopril 20 MG tablet Commonly known as:  PRINIVIL,ZESTRIL Take 20 mg by mouth daily.   loratadine 10 MG tablet Commonly known as:  CLARITIN Take 10 mg by mouth at bedtime.   methocarbamol 500 MG tablet Commonly known as:   ROBAXIN Take 1 tablet (500 mg total) by mouth every 6 (six) hours as needed for muscle spasms.   montelukast 10 MG tablet Commonly known as:  SINGULAIR Take 1 tablet by mouth daily. What changed:    how much to take  how to take this  when to take this  additional instructions   oxyCODONE 5 MG immediate release tablet Commonly known as:  Oxy IR/ROXICODONE Take 1-2 tablets (5-10 mg total) by mouth every 4 (four) hours as needed for moderate pain or severe pain.   polyethylene glycol packet Commonly known as:  MIRALAX / GLYCOLAX Take 17 g by mouth 2 (two) times daily.   ranitidine 150 MG tablet Commonly known as:  ZANTAC TAKE ONE TABLET BY MOUTH TWICE DAILY. What changed:    how much to take  how to take this  when to take this  reasons to take this  additional instructions   rOPINIRole 0.5 MG tablet Commonly known as:  REQUIP Take 0.5 mg by mouth 2 (two) times daily. At 5pm and bedtime   topiramate 100 MG tablet Commonly known as:  TOPAMAX Take 100 mg by mouth 2 (two) times daily.   vitamin A & D ointment Apply 1 application topically as needed for dry skin.            Discharge Care Instructions  (From admission, onward)        Start     Ordered   02/04/18 0000  Change dressing    Comments:  Maintain surgical dressing until follow up in the clinic. If the edges start to pull up, may reinforce with tape. If the dressing is no longer working, may remove and cover with gauze and tape, but must keep the area dry and clean.  Call with any questions or concerns.   02/04/18 0454       Signed: Anastasio Auerbach.    PA-C  02/09/2018, 1:12 PM

## 2018-03-19 DIAGNOSIS — Z471 Aftercare following joint replacement surgery: Secondary | ICD-10-CM | POA: Diagnosis not present

## 2018-03-19 DIAGNOSIS — Z96642 Presence of left artificial hip joint: Secondary | ICD-10-CM | POA: Diagnosis not present

## 2018-05-09 ENCOUNTER — Encounter: Payer: Self-pay | Admitting: *Deleted

## 2018-05-11 DIAGNOSIS — M79641 Pain in right hand: Secondary | ICD-10-CM | POA: Diagnosis not present

## 2018-05-11 DIAGNOSIS — G894 Chronic pain syndrome: Secondary | ICD-10-CM | POA: Diagnosis not present

## 2018-05-19 DIAGNOSIS — R69 Illness, unspecified: Secondary | ICD-10-CM | POA: Diagnosis not present

## 2018-05-19 DIAGNOSIS — Z23 Encounter for immunization: Secondary | ICD-10-CM | POA: Diagnosis not present

## 2018-05-19 DIAGNOSIS — Z1389 Encounter for screening for other disorder: Secondary | ICD-10-CM | POA: Diagnosis not present

## 2018-05-19 DIAGNOSIS — E6609 Other obesity due to excess calories: Secondary | ICD-10-CM | POA: Diagnosis not present

## 2018-05-19 DIAGNOSIS — Z6835 Body mass index (BMI) 35.0-35.9, adult: Secondary | ICD-10-CM | POA: Diagnosis not present

## 2018-05-19 DIAGNOSIS — I781 Nevus, non-neoplastic: Secondary | ICD-10-CM | POA: Diagnosis not present

## 2018-05-27 ENCOUNTER — Ambulatory Visit (INDEPENDENT_AMBULATORY_CARE_PROVIDER_SITE_OTHER): Payer: Medicare HMO | Admitting: Psychiatry

## 2018-05-27 DIAGNOSIS — R69 Illness, unspecified: Secondary | ICD-10-CM | POA: Diagnosis not present

## 2018-05-27 DIAGNOSIS — F3181 Bipolar II disorder: Secondary | ICD-10-CM | POA: Diagnosis not present

## 2018-05-27 DIAGNOSIS — F4001 Agoraphobia with panic disorder: Secondary | ICD-10-CM | POA: Diagnosis not present

## 2018-05-27 DIAGNOSIS — G2581 Restless legs syndrome: Secondary | ICD-10-CM | POA: Diagnosis not present

## 2018-05-27 DIAGNOSIS — F411 Generalized anxiety disorder: Secondary | ICD-10-CM | POA: Diagnosis not present

## 2018-05-27 MED ORDER — CLONAZEPAM 0.5 MG PO TABS: 1.0000 mg | ORAL_TABLET | Freq: Every day | ORAL | 1 refills | Status: DC

## 2018-05-27 MED ORDER — LAMOTRIGINE 100 MG PO TABS: 100.0000 mg | ORAL_TABLET | Freq: Two times a day (BID) | ORAL | 1 refills | Status: DC

## 2018-05-27 MED ORDER — TOPIRAMATE 100 MG PO TABS: 100.0000 mg | ORAL_TABLET | Freq: Two times a day (BID) | ORAL | 1 refills | Status: DC

## 2018-05-27 MED ORDER — ALPRAZOLAM 0.5 MG PO TABS: 0.5000 mg | ORAL_TABLET | Freq: Every day | ORAL | 0 refills | Status: DC | PRN

## 2018-05-27 MED ORDER — ROPINIROLE HCL 0.5 MG PO TABS: 0.5000 mg | ORAL_TABLET | Freq: Two times a day (BID) | ORAL | 1 refills | Status: DC

## 2018-05-27 NOTE — Progress Notes (Signed)
[facility]  Patient ID: Cheryl Hoover,  MRN: [id number]  PCP: Avis Epley, PA-C  Date of Evaluation: 05/27/2018 Time spent:20 minutes   HISTORY/CURRENT STATUS: Episodic outbursts anger, brief.  When she feels she's right. Mostly at home, not in public. No pattern.  Sleep px chronic, awakens. 4-6 hours.  No nap.  Not usuallly sleepy.  Ok driving.  NM rare.  RLS managed.  Has panic with dentist.  Coffee usually not after noon.  Tea afternoon.  No major mood swings.     Individual Medical History/ Review of Systems: Changes? :No  Allergies: Avocado and Penicillins  Current Medications:  Current Outpatient Medications:  .  albuterol (VENTOLIN HFA) 108 (90 BASE) MCG/ACT inhaler, Inhale 2 puffs into the lungs every 4 (four) hours as needed for wheezing. (Patient taking differently: Inhale 2 puffs into the lungs every 4 (four) hours as needed for wheezing or shortness of breath. ), Disp: 18 g, Rfl: 6 .  ALPRAZolam (XANAX) 0.5 MG tablet, Take 1 tablet (0.5 mg total) by mouth daily as needed for anxiety or sleep., Disp: 70 tablet, Rfl: 0 .  clonazePAM (KLONOPIN) 0.5 MG tablet, Take 2 tablets (1 mg total) by mouth at bedtime., Disp: 180 tablet, Rfl: 1 .  esomeprazole (NEXIUM) 20 MG capsule, Take 20 mg by mouth at bedtime. , Disp: , Rfl:  .  lamoTRIgine (LAMICTAL) 100 MG tablet, Take 1 tablet (100 mg total) by mouth 2 (two) times daily., Disp: 180 tablet, Rfl: 1 .  lisinopril (PRINIVIL,ZESTRIL) 20 MG tablet, Take 20 mg by mouth daily., Disp: , Rfl:  .  loratadine (CLARITIN) 10 MG tablet, Take 10 mg by mouth at bedtime. , Disp: , Rfl:  .  rOPINIRole (REQUIP) 0.5 MG tablet, Take 1 tablet (0.5 mg total) by mouth 2 (two) times daily. At 5pm and bedtime, Disp: 180 tablet, Rfl: 1 .  topiramate (TOPAMAX) 100 MG tablet, Take 1 tablet (100 mg total) by mouth 2 (two) times daily., Disp: 180 tablet, Rfl: 1 .  acetaminophen (TYLENOL) 500 MG tablet, Take 2 tablets (1,000 mg total) by  mouth every 8 (eight) hours., Disp: 30 tablet, Rfl: 0 .  bismuth subsalicylate (PEPTO BISMOL) 262 MG/15ML suspension, Take 30 mLs by mouth 2 (two) times daily as needed for indigestion or diarrhea or loose stools. , Disp: , Rfl:  .  docusate sodium (COLACE) 100 MG capsule, Take 1 capsule (100 mg total) by mouth 2 (two) times daily. (Patient not taking: Reported on 05/27/2018), Disp: 10 capsule, Rfl: 0 .  ferrous sulfate (FERROUSUL) 325 (65 FE) MG tablet, Take 1 tablet (325 mg total) by mouth 3 (three) times daily with meals. (Patient not taking: Reported on 05/27/2018), Disp: , Rfl: 3 .  Fluticasone-Salmeterol (ADVAIR DISKUS) 250-50 MCG/DOSE AEPB, INHALE ONE PUFF BY MOUTH TWICE DAILY (Patient taking differently: Inhale 2 puffs into the lungs 2 (two) times daily as needed (shortness of breath). ), Disp: 60 each, Rfl: 3 .  methocarbamol (ROBAXIN) 500 MG tablet, Take 1 tablet (500 mg total) by mouth every 6 (six) hours as needed for muscle spasms. (Patient not taking: Reported on 05/27/2018), Disp: 40 tablet, Rfl: 0 .  montelukast (SINGULAIR) 10 MG tablet, Take 1 tablet by mouth daily. (Patient taking differently: Take 10 mg by mouth at bedtime. ), Disp: 90 tablet, Rfl: 1 .  oxyCODONE (OXY IR/ROXICODONE) 5 MG immediate release tablet, Take 1-2 tablets (5-10 mg total) by mouth every 4 (four) hours as needed for moderate pain or severe  pain. (Patient not taking: Reported on 05/27/2018), Disp: 60 tablet, Rfl: 0 .  polyethylene glycol (MIRALAX / GLYCOLAX) packet, Take 17 g by mouth 2 (two) times daily., Disp: 14 each, Rfl: 0 .  ranitidine (ZANTAC) 150 MG tablet, TAKE ONE TABLET BY MOUTH TWICE DAILY. (Patient taking differently: Take 150 mg by mouth daily as needed for heartburn. ), Disp: 60 tablet, Rfl: 0 .  Vitamins A & D (VITAMIN A & D) ointment, Apply 1 application topically as needed for dry skin., Disp: , Rfl:  Medication Side Effects: None  Family Medical/ Social History: Changes? Dentist soon and  phobic.  MENTAL HEALTH EXAM:  There were no vitals taken for this visit.There is no height or weight on file to calculate BMI.  General Appearance: Casual  Eye Contact:  Good  Speech:  Clear and Coherent  Volume:  Normal  Mood:  Irritable  Affect:  Appropriate  Thought Process:  Coherent  Orientation:  Full (Time, Place, and Person)  Thought Content: WDL   Suicidal Thoughts:  No  Homicidal Thoughts:  No  Memory:  Recent  Judgement:  Good  Insight:  Good  Psychomotor Activity:  Normal  Concentration:  Concentration: Good  Recall:  Good  Fund of Knowledge: Good  Language: Good  Akathisia:  No  AIMS (if indicated): not done  Assets:  Desire for Improvement Housing Leisure Time Social Support  ADL's:  Intact  Cognition: WNL  Prognosis:  Fair    DIAGNOSES:    ICD-10-CM   1. Bipolar II disorder (HCC) F31.81 lamoTRIgine (LAMICTAL) 100 MG tablet  2. Generalized anxiety disorder F41.1 ALPRAZolam (XANAX) 0.5 MG tablet  3. Panic disorder with agoraphobia and moderate panic attacks F40.01 topiramate (TOPAMAX) 100 MG tablet  4. Restless leg syndrome, controlled G25.81 clonazePAM (KLONOPIN) 0.5 MG tablet    rOPINIRole (REQUIP) 0.5 MG tablet    RECOMMENDATIONS: Care re: bz and driving.  Watch caffeine.  Reduce coffee/caffeine. Work on Building [employer]. Discussed tolerance bz. She agrees to continue meds unchanged.   Lauraine Rinne, MD

## 2018-07-02 DIAGNOSIS — M13841 Other specified arthritis, right hand: Secondary | ICD-10-CM | POA: Diagnosis not present

## 2018-07-10 DIAGNOSIS — M4180 Other forms of scoliosis, site unspecified: Secondary | ICD-10-CM | POA: Diagnosis not present

## 2018-07-10 DIAGNOSIS — M545 Low back pain, unspecified: Secondary | ICD-10-CM | POA: Insufficient documentation

## 2018-07-10 DIAGNOSIS — Z96642 Presence of left artificial hip joint: Secondary | ICD-10-CM | POA: Diagnosis not present

## 2018-07-28 DIAGNOSIS — E669 Obesity, unspecified: Secondary | ICD-10-CM | POA: Diagnosis not present

## 2018-07-28 DIAGNOSIS — G2581 Restless legs syndrome: Secondary | ICD-10-CM | POA: Diagnosis not present

## 2018-07-28 DIAGNOSIS — J302 Other seasonal allergic rhinitis: Secondary | ICD-10-CM | POA: Diagnosis not present

## 2018-07-28 DIAGNOSIS — K219 Gastro-esophageal reflux disease without esophagitis: Secondary | ICD-10-CM | POA: Diagnosis not present

## 2018-07-28 DIAGNOSIS — R69 Illness, unspecified: Secondary | ICD-10-CM | POA: Diagnosis not present

## 2018-07-28 DIAGNOSIS — G8929 Other chronic pain: Secondary | ICD-10-CM | POA: Diagnosis not present

## 2018-07-28 DIAGNOSIS — M1990 Unspecified osteoarthritis, unspecified site: Secondary | ICD-10-CM | POA: Diagnosis not present

## 2018-07-28 DIAGNOSIS — J449 Chronic obstructive pulmonary disease, unspecified: Secondary | ICD-10-CM | POA: Diagnosis not present

## 2018-07-28 DIAGNOSIS — I10 Essential (primary) hypertension: Secondary | ICD-10-CM | POA: Diagnosis not present

## 2018-08-12 DIAGNOSIS — Z79899 Other long term (current) drug therapy: Secondary | ICD-10-CM | POA: Diagnosis not present

## 2018-08-12 DIAGNOSIS — Z79891 Long term (current) use of opiate analgesic: Secondary | ICD-10-CM | POA: Diagnosis not present

## 2018-08-12 DIAGNOSIS — Z5181 Encounter for therapeutic drug level monitoring: Secondary | ICD-10-CM | POA: Diagnosis not present

## 2018-08-12 DIAGNOSIS — G894 Chronic pain syndrome: Secondary | ICD-10-CM | POA: Diagnosis not present

## 2018-08-28 DIAGNOSIS — M13841 Other specified arthritis, right hand: Secondary | ICD-10-CM | POA: Diagnosis not present

## 2018-09-07 DIAGNOSIS — R69 Illness, unspecified: Secondary | ICD-10-CM | POA: Diagnosis not present

## 2018-09-20 ENCOUNTER — Other Ambulatory Visit: Payer: Self-pay | Admitting: Psychiatry

## 2018-09-20 DIAGNOSIS — G2581 Restless legs syndrome: Secondary | ICD-10-CM

## 2018-09-23 ENCOUNTER — Encounter: Payer: Self-pay | Admitting: Emergency Medicine

## 2018-09-23 DIAGNOSIS — F409 Phobic anxiety disorder, unspecified: Secondary | ICD-10-CM | POA: Insufficient documentation

## 2018-09-23 DIAGNOSIS — F3181 Bipolar II disorder: Secondary | ICD-10-CM | POA: Insufficient documentation

## 2018-10-07 DIAGNOSIS — I10 Essential (primary) hypertension: Secondary | ICD-10-CM | POA: Diagnosis not present

## 2018-10-07 DIAGNOSIS — R197 Diarrhea, unspecified: Secondary | ICD-10-CM | POA: Diagnosis not present

## 2018-10-07 DIAGNOSIS — K219 Gastro-esophageal reflux disease without esophagitis: Secondary | ICD-10-CM | POA: Diagnosis not present

## 2018-11-12 DIAGNOSIS — Z1389 Encounter for screening for other disorder: Secondary | ICD-10-CM | POA: Diagnosis not present

## 2018-11-12 DIAGNOSIS — E6609 Other obesity due to excess calories: Secondary | ICD-10-CM | POA: Diagnosis not present

## 2018-11-12 DIAGNOSIS — I10 Essential (primary) hypertension: Secondary | ICD-10-CM | POA: Diagnosis not present

## 2018-11-12 DIAGNOSIS — J4530 Mild persistent asthma, uncomplicated: Secondary | ICD-10-CM | POA: Diagnosis not present

## 2018-11-12 DIAGNOSIS — Z6835 Body mass index (BMI) 35.0-35.9, adult: Secondary | ICD-10-CM | POA: Diagnosis not present

## 2018-12-01 ENCOUNTER — Encounter: Payer: Self-pay | Admitting: Psychiatry

## 2018-12-01 ENCOUNTER — Ambulatory Visit (INDEPENDENT_AMBULATORY_CARE_PROVIDER_SITE_OTHER): Payer: Medicare HMO | Admitting: Psychiatry

## 2018-12-01 ENCOUNTER — Other Ambulatory Visit: Payer: Self-pay

## 2018-12-01 DIAGNOSIS — R69 Illness, unspecified: Secondary | ICD-10-CM | POA: Diagnosis not present

## 2018-12-01 DIAGNOSIS — G2581 Restless legs syndrome: Secondary | ICD-10-CM | POA: Diagnosis not present

## 2018-12-01 DIAGNOSIS — F411 Generalized anxiety disorder: Secondary | ICD-10-CM

## 2018-12-01 DIAGNOSIS — F4001 Agoraphobia with panic disorder: Secondary | ICD-10-CM | POA: Diagnosis not present

## 2018-12-01 DIAGNOSIS — F3181 Bipolar II disorder: Secondary | ICD-10-CM | POA: Diagnosis not present

## 2018-12-01 MED ORDER — CLONAZEPAM 0.5 MG PO TABS: 1.0000 mg | ORAL_TABLET | Freq: Every day | ORAL | 1 refills | Status: DC

## 2018-12-01 MED ORDER — ROPINIROLE HCL 0.5 MG PO TABS: ORAL_TABLET | ORAL | 1 refills | Status: DC

## 2018-12-01 MED ORDER — LAMOTRIGINE 100 MG PO TABS: 100.0000 mg | ORAL_TABLET | Freq: Two times a day (BID) | ORAL | 1 refills | Status: DC

## 2018-12-01 MED ORDER — TOPIRAMATE 100 MG PO TABS
100.0000 mg | ORAL_TABLET | Freq: Two times a day (BID) | ORAL | 1 refills | Status: DC
Start: 2018-12-01 — End: 2019-04-11

## 2018-12-01 NOTE — Progress Notes (Signed)
Cheryl Hoover [id number] 05-Nov-1955 63 y.o.  Subjective:   Patient ID:  Cheryl Hoover is a 63 y.o. (DOB Nov 13, 1955) female.  Chief Complaint:  Chief Complaint  Patient presents with  . Manic Behavior  . Anxiety  . Depression  . Follow-up    med mangagement    HPI Cheryl Hoover presents to the office today for follow-up of bipolar and anxiety.  Still tend to be short-tempered but Ok overall.  Low appetite.  Likes snacks.  Eats supper. Question if med related. Sedentary.  Doesn't know how to lose weight.  Weight stable  Patient reports stable mood and denies depressed or irritable moods.  Patient reports recent difficulty with anxiety mainly claustrophobia..  Patient denies difficulty with sleep initiation or maintenance. Denies appetite disturbance.  Patient reports that energy and motivation have been chronically poor.  Patient denies any difficulty with concentration.  Patient denies any suicidal ideation.  No panic lately. No mania.  RLS managed.   Past Psychiatric Medication Trials: Wellbutrin, Pristiq, buspirone, clonazepam, topiramate 100 mg twice daily, lamotrigine 100 mg twice daily, ropinirole, Wellbutrin, Serzone 600 mg, carbamazepine, Nardil, Prozac, Paxil hypomania, Zoloft, Saphris, gabapentin, Latuda, imipramine, desipramine  Review of Systems:  Review of Systems  Constitutional: Positive for appetite change. Negative for unexpected weight change.  Neurological: Negative for tremors and weakness.    Medications: I have reviewed the patient's current medications.  Current Outpatient Medications  Medication Sig Dispense Refill  . ALPRAZolam (XANAX) 0.5 MG tablet Take 1 tablet (0.5 mg total) by mouth daily as needed for anxiety or sleep. 70 tablet 0  . bismuth subsalicylate (PEPTO BISMOL) 262 MG/15ML suspension Take 30 mLs by mouth 2 (two) times daily as needed for indigestion or diarrhea or loose stools.     Marland Kitchen esomeprazole (NEXIUM) 20 MG capsule Take 20 mg  by mouth at bedtime.     . Fluticasone-Salmeterol (ADVAIR DISKUS) 250-50 MCG/DOSE AEPB INHALE ONE PUFF BY MOUTH TWICE DAILY (Patient taking differently: Inhale 2 puffs into the lungs 2 (two) times daily as needed (shortness of breath). ) 60 each 3  . lisinopril (PRINIVIL,ZESTRIL) 20 MG tablet Take 20 mg by mouth daily.    Marland Kitchen loratadine (CLARITIN) 10 MG tablet Take 10 mg by mouth at bedtime.     . montelukast (SINGULAIR) 10 MG tablet Take 1 tablet by mouth daily. (Patient taking differently: Take 10 mg by mouth at bedtime. ) 90 tablet 1  . polyethylene glycol (MIRALAX / GLYCOLAX) packet Take 17 g by mouth 2 (two) times daily. 14 each 0  . Vitamins A & D (VITAMIN A & D) ointment Apply 1 application topically as needed for dry skin.    Marland Kitchen acetaminophen (TYLENOL) 500 MG tablet Take 2 tablets (1,000 mg total) by mouth every 8 (eight) hours. 30 tablet 0  . albuterol (VENTOLIN HFA) 108 (90 BASE) MCG/ACT inhaler Inhale 2 puffs into the lungs every 4 (four) hours as needed for wheezing. (Patient taking differently: Inhale 2 puffs into the lungs every 4 (four) hours as needed for wheezing or shortness of breath. ) 18 g 6  . clonazePAM (KLONOPIN) 0.5 MG tablet Take 2 tablets (1 mg total) by mouth at bedtime. 180 tablet 1  . docusate sodium (COLACE) 100 MG capsule Take 1 capsule (100 mg total) by mouth 2 (two) times daily. (Patient not taking: Reported on 05/27/2018) 10 capsule 0  . ferrous sulfate (FERROUSUL) 325 (65 FE) MG tablet Take 1 tablet (325 mg total) by mouth 3 (  three) times daily with meals. (Patient not taking: Reported on 05/27/2018)  3  . lamoTRIgine (LAMICTAL) 100 MG tablet Take 1 tablet (100 mg total) by mouth 2 (two) times daily. 180 tablet 1  . methocarbamol (ROBAXIN) 500 MG tablet Take 1 tablet (500 mg total) by mouth every 6 (six) hours as needed for muscle spasms. (Patient not taking: Reported on 05/27/2018) 40 tablet 0  . oxyCODONE (OXY IR/ROXICODONE) 5 MG immediate release tablet Take 1-2 tablets  (5-10 mg total) by mouth every 4 (four) hours as needed for moderate pain or severe pain. (Patient not taking: Reported on 05/27/2018) 60 tablet 0  . ranitidine (ZANTAC) 150 MG tablet TAKE ONE TABLET BY MOUTH TWICE DAILY. (Patient not taking: Reported on 12/01/2018) 60 tablet 0  . rOPINIRole (REQUIP) 0.5 MG tablet TAKE 1 & 1/2 (ONE & ONE-HALF) TABLETS BY MOUTH AT 5:00PM AND 1 & 1/2 (ONE & ONE-HALF) AT BEDTIME 270 tablet 1  . topiramate (TOPAMAX) 100 MG tablet Take 1 tablet (100 mg total) by mouth 2 (two) times daily. 180 tablet 1   No current facility-administered medications for this visit.     Medication Side Effects: Appetite Suppression and Other: ?  Allergies:  Allergies  Allergen Reactions  . Avocado Itching  . Penicillins Other (See Comments)    Unknown,childhood allergy  Has patient had a PCN reaction causing immediate rash, facial/tongue/throat swelling, SOB or lightheadedness with hypotension: Unknown Has patient had a PCN reaction causing severe rash involving mucus membranes or skin necrosis: Unknown Has patient had a PCN reaction that required hospitalization: Unknown Has patient had a PCN reaction occurring within the last 10 years: No If all of the above answers are "NO", then may proceed with Cephalosporin use.     Past Medical History:  Diagnosis Date  . Allergy   . Anemia   . Anxiety   . Arthritis   . Asthma   . Bipolar disorder (HCC)   . Complication of anesthesia    woke up during surgery   . Depression   . GERD (gastroesophageal reflux disease)   . Hypertension   . Scoliosis     Family History  Problem Relation Age of Onset  . Hypertension Mother        Deceased  . Anxiety disorder Mother   . Parkinson's disease Father        Deceased  . Anxiety disorder Father   . Anxiety disorder Brother   . Anxiety disorder Son   . Diabetes Son        Deceased, 71  . Healthy Son     Social History   Socioeconomic History  . Marital status: Married     Spouse name: Not on file  . Number of children: 2  . Years of education: Not on file  . Highest education level: Not on file  Occupational History  . Occupation: None  Social Needs  . Financial resource strain: Not on file  . Food insecurity:    Worry: Not on file    Inability: Not on file  . Transportation needs:    Medical: Not on file    Non-medical: Not on file  Tobacco Use  . Smoking status: Former Smoker    Types: Cigarettes    Last attempt to quit: 08/26/2008    Years since quitting: 10.2  . Smokeless tobacco: Never Used  Substance and Sexual Activity  . Alcohol use: No    Alcohol/week: 0.0 standard drinks    Comment: hx  of   . Drug use: Yes    Comment: hx of years ago - marijuana   . Sexual activity: Not on file  Lifestyle  . Physical activity:    Days per week: Not on file    Minutes per session: Not on file  . Stress: Not on file  Relationships  . Social connections:    Talks on phone: Not on file    Gets together: Not on file    Attends religious service: Not on file    Active member of club or organization: Not on file    Attends meetings of clubs or organizations: Not on file    Relationship status: Not on file  . Intimate partner violence:    Fear of current or ex partner: Not on file    Emotionally abused: Not on file    Physically abused: Not on file    Forced sexual activity: Not on file  Other Topics Concern  . Not on file  Social History Narrative   She was last working in March 2016 and laid off.  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   Highest level of education:  High school   She lives alone.      Past Medical History, Surgical history, Social history, and Family history were reviewed and updated as appropriate.   Please see review of systems for further details on the patient's review from today.   Objective:   Physical Exam:  There were no vitals taken for this visit.  Physical  Exam Neurological:     Mental Status: She is alert and oriented to person, place, and time.     Cranial Nerves: No dysarthria.  Psychiatric:        Attention and Perception: Attention normal. She does not perceive auditory hallucinations.        Mood and Affect: Mood is anxious.        Speech: Speech normal.        Behavior: Behavior is cooperative.        Thought Content: Thought content normal. Thought content is not paranoid or delusional. Thought content does not include homicidal or suicidal ideation. Thought content does not include homicidal or suicidal plan.        Cognition and Memory: Cognition and memory normal.        Judgment: Judgment normal.     Comments: Chronic low grade irritability.     Lab Review:     Component Value Date/Time   NA 142 02/04/2018 0531   K 4.1 02/04/2018 0531   CL 113 (H) 02/04/2018 0531   CO2 22 02/04/2018 0531   GLUCOSE 121 (H) 02/04/2018 0531   BUN 22 (H) 02/04/2018 0531   CREATININE 1.10 (H) 02/04/2018 0531   CREATININE 1.05 12/15/2014 1424   CALCIUM 8.7 (L) 02/04/2018 0531   PROT 6.1 12/15/2014 1424   ALBUMIN 4.0 12/15/2014 1424   AST 13 12/15/2014 1424   ALT 10 12/15/2014 1424   ALKPHOS 72 12/15/2014 1424   BILITOT 0.6 12/15/2014 1424   GFRNONAA 53 (L) 02/04/2018 0531   GFRNONAA 59 (L) 12/15/2014 1424   GFRAA >60 02/04/2018 0531   GFRAA 68 12/15/2014 1424       Component Value Date/Time   WBC 7.8 02/04/2018 0531   RBC 3.50 (L) 02/04/2018 0531   HGB 10.9 (L) 02/04/2018 0531   HCT 33.5 (L) 02/04/2018 0531   PLT 188 02/04/2018 0531   MCV  95.7 02/04/2018 0531   MCH 31.1 02/04/2018 0531   MCHC 32.5 02/04/2018 0531   RDW 12.6 02/04/2018 0531    No results found for: POCLITH, LITHIUM   No results found for: PHENYTOIN, PHENOBARB, VALPROATE, CBMZ   .res Assessment: Plan:    Bipolar II disorder (HCC) - Plan: lamoTRIgine (LAMICTAL) 100 MG tablet  Generalized anxiety disorder  Panic disorder with agoraphobia and moderate  panic attacks - Plan: topiramate (TOPAMAX) 100 MG tablet  Restless leg syndrome, controlled - Plan: rOPINIRole (REQUIP) 0.5 MG tablet, clonazePAM (KLONOPIN) 0.5 MG tablet   Patient has a long history of bipolar disorder and has failed multiple medications as noted above.  She is fairly stable with regard to her mood.  She has chronic residual anxiety and social avoidance stomach agoraphobia but not actively having panic attacks.  She complains of a poor appetite but has not lost significant weight.  We discussed that this could be a side effect of topiramate.  She is going to try reducing the morning dose to 50 mg in the morning and keep 100 mg in the evening and see if that improves her appetite during the day.  She is aware that this could increase her anxiety level and if it does let us know.  Let us know if it causes any mood instability.  Restless legs is managed fairly well most of the time with ropinirole  The only med change today is to decrease topiramate 100 mg tablets one half every morning and 1 every afternoon  This was a 30-minute appointment  I connected with patient by a video enabled telemedicine application or telephone, with their informed consent, and verified patient privacy and that I am speaking with the correct person using two identifiers.  I was located at office and patient at home.  Follow-up 6 months  Meredith Staggersarey Cottle MD, DFAPA  Please see After Visit Summary for patient specific instructions.  No future appointments.  No orders of the defined types were placed in this encounter.     -------------------------------

## 2019-01-07 DIAGNOSIS — M48061 Spinal stenosis, lumbar region without neurogenic claudication: Secondary | ICD-10-CM | POA: Diagnosis not present

## 2019-01-07 DIAGNOSIS — M545 Low back pain: Secondary | ICD-10-CM | POA: Diagnosis not present

## 2019-01-07 DIAGNOSIS — M5417 Radiculopathy, lumbosacral region: Secondary | ICD-10-CM | POA: Diagnosis not present

## 2019-01-07 DIAGNOSIS — G894 Chronic pain syndrome: Secondary | ICD-10-CM | POA: Diagnosis not present

## 2019-01-07 DIAGNOSIS — Z79891 Long term (current) use of opiate analgesic: Secondary | ICD-10-CM | POA: Diagnosis not present

## 2019-01-21 DIAGNOSIS — R69 Illness, unspecified: Secondary | ICD-10-CM | POA: Diagnosis not present

## 2019-01-21 DIAGNOSIS — F3181 Bipolar II disorder: Secondary | ICD-10-CM | POA: Diagnosis not present

## 2019-01-21 DIAGNOSIS — Z0001 Encounter for general adult medical examination with abnormal findings: Secondary | ICD-10-CM | POA: Diagnosis not present

## 2019-01-21 DIAGNOSIS — Z1389 Encounter for screening for other disorder: Secondary | ICD-10-CM | POA: Diagnosis not present

## 2019-01-21 DIAGNOSIS — Z6834 Body mass index (BMI) 34.0-34.9, adult: Secondary | ICD-10-CM | POA: Diagnosis not present

## 2019-01-21 DIAGNOSIS — I10 Essential (primary) hypertension: Secondary | ICD-10-CM | POA: Diagnosis not present

## 2019-01-21 DIAGNOSIS — E6609 Other obesity due to excess calories: Secondary | ICD-10-CM | POA: Diagnosis not present

## 2019-01-21 DIAGNOSIS — J4520 Mild intermittent asthma, uncomplicated: Secondary | ICD-10-CM | POA: Diagnosis not present

## 2019-01-21 DIAGNOSIS — K219 Gastro-esophageal reflux disease without esophagitis: Secondary | ICD-10-CM | POA: Diagnosis not present

## 2019-02-01 ENCOUNTER — Encounter: Payer: Self-pay | Admitting: Gastroenterology

## 2019-02-18 DIAGNOSIS — B029 Zoster without complications: Secondary | ICD-10-CM | POA: Diagnosis not present

## 2019-02-18 DIAGNOSIS — E6609 Other obesity due to excess calories: Secondary | ICD-10-CM | POA: Diagnosis not present

## 2019-02-18 DIAGNOSIS — Z6834 Body mass index (BMI) 34.0-34.9, adult: Secondary | ICD-10-CM | POA: Diagnosis not present

## 2019-03-31 ENCOUNTER — Ambulatory Visit: Payer: Medicare HMO | Admitting: Nurse Practitioner

## 2019-04-10 ENCOUNTER — Other Ambulatory Visit: Payer: Self-pay | Admitting: Psychiatry

## 2019-04-10 DIAGNOSIS — F4001 Agoraphobia with panic disorder: Secondary | ICD-10-CM

## 2019-04-27 DIAGNOSIS — R69 Illness, unspecified: Secondary | ICD-10-CM | POA: Diagnosis not present

## 2019-05-31 ENCOUNTER — Telehealth: Payer: Self-pay | Admitting: Psychiatry

## 2019-05-31 ENCOUNTER — Other Ambulatory Visit: Payer: Self-pay | Admitting: Psychiatry

## 2019-05-31 DIAGNOSIS — F4001 Agoraphobia with panic disorder: Secondary | ICD-10-CM

## 2019-05-31 DIAGNOSIS — G2581 Restless legs syndrome: Secondary | ICD-10-CM

## 2019-05-31 MED ORDER — TOPIRAMATE 100 MG PO TABS: 100.0000 mg | ORAL_TABLET | Freq: Two times a day (BID) | ORAL | 0 refills | Status: DC

## 2019-05-31 NOTE — Telephone Encounter (Signed)
Refill sent.

## 2019-05-31 NOTE — Telephone Encounter (Signed)
Pt requests RF of Topomax  90 day to go to [facility] in [place], [place].

## 2019-06-03 ENCOUNTER — Ambulatory Visit: Payer: Medicare HMO | Admitting: Nurse Practitioner

## 2019-06-08 ENCOUNTER — Ambulatory Visit: Payer: Medicare HMO | Admitting: Psychiatry

## 2019-06-14 ENCOUNTER — Encounter: Payer: Self-pay | Admitting: Psychiatry

## 2019-06-14 ENCOUNTER — Other Ambulatory Visit: Payer: Self-pay

## 2019-06-14 ENCOUNTER — Ambulatory Visit (INDEPENDENT_AMBULATORY_CARE_PROVIDER_SITE_OTHER): Payer: Medicare HMO | Admitting: Psychiatry

## 2019-06-14 DIAGNOSIS — F411 Generalized anxiety disorder: Secondary | ICD-10-CM | POA: Diagnosis not present

## 2019-06-14 DIAGNOSIS — F4001 Agoraphobia with panic disorder: Secondary | ICD-10-CM | POA: Diagnosis not present

## 2019-06-14 DIAGNOSIS — G2581 Restless legs syndrome: Secondary | ICD-10-CM

## 2019-06-14 DIAGNOSIS — R69 Illness, unspecified: Secondary | ICD-10-CM | POA: Diagnosis not present

## 2019-06-14 DIAGNOSIS — F5105 Insomnia due to other mental disorder: Secondary | ICD-10-CM

## 2019-06-14 DIAGNOSIS — F3181 Bipolar II disorder: Secondary | ICD-10-CM

## 2019-06-14 MED ORDER — CLONAZEPAM 0.5 MG PO TABS: 1.0000 mg | ORAL_TABLET | Freq: Every day | ORAL | 1 refills | Status: DC

## 2019-06-14 MED ORDER — TOPIRAMATE 50 MG PO TABS: ORAL_TABLET | ORAL | 1 refills | Status: DC

## 2019-06-14 MED ORDER — ROPINIROLE HCL 0.5 MG PO TABS: ORAL_TABLET | ORAL | 1 refills | Status: DC

## 2019-06-14 MED ORDER — LAMOTRIGINE 100 MG PO TABS: 100.0000 mg | ORAL_TABLET | Freq: Two times a day (BID) | ORAL | 1 refills | Status: DC

## 2019-06-14 NOTE — Patient Instructions (Signed)
The only med change today is to decrease topiramate 100 mg tablets  To one half every morning and 1 every afternoon.  This may help appetite.

## 2019-06-14 NOTE — Progress Notes (Signed)
Cheryl HampshireCharlene E Hoover [id number] 05-Mar-1956 63 y.o.  Subjective:   Patient ID:  Cheryl HampshireCharlene E Roulhac is a 63 y.o. (DOB 05-Mar-1956) female.  Chief Complaint:  Chief Complaint  Patient presents with  . Follow-up    Medication Management  . Other    Bipolar 2    HPI Cheryl Hoover presents to the office today for follow-up of bipolar and anxiety.  Last seen April 2020.  Topiramate was reduced to 50 mg in the morning and 100 mg in the evening.  No other meds were changed.  Forgot to reduce the topiramate and has continued 100 mg BID.  Kind of down in the dumps.  Doesn't like being at home so much.  Not sure of the cause.  Things are a lot different for us bc so isolated and doing little.  Like to travel and can't now.  But did drive across the country and it was fun but not every day is like that.   Still tend to be short-tempered but Ok overall.  Low appetite.  Likes snacks.  Eats supper. Question if med related. Sedentary.  Doesn't know how to lose weight.  Weight stable  Rare Xanax.  Doesn't like to drive but will do it.  Patient reports stable mood and denies or irritable moods.  Patient reports recent difficulty with anxiety mainly claustrophobia..  Patient denies difficulty with sleep initiation or maintenance. Poor appetite disturbance.  Patient reports that energy and motivation have been chronically poor.  Patient denies any difficulty with concentration.  Patient denies any suicidal ideation.  No panic lately. No mania.  RLS managed.  Past Psychiatric Medication Trials: Wellbutrin, Pristiq, buspirone, clonazepam, topiramate 100 mg twice daily, lamotrigine 100 mg twice daily, ropinirole, Wellbutrin, Serzone 600 mg, carbamazepine, Nardil, Prozac, Paxil hypomania, Zoloft, Saphris, gabapentin, Latuda, imipramine, desipramine  Review of Systems:  Review of Systems  Constitutional: Positive for appetite change. Negative for unexpected weight change.  Musculoskeletal: Positive for  arthralgias.  Neurological: Negative for tremors, weakness and headaches.    Medications: I have reviewed the patient's current medications.  Current Outpatient Medications  Medication Sig Dispense Refill  . acetaminophen (TYLENOL) 500 MG tablet Take 2 tablets (1,000 mg total) by mouth every 8 (eight) hours. 30 tablet 0  . albuterol (VENTOLIN HFA) 108 (90 BASE) MCG/ACT inhaler Inhale 2 puffs into the lungs every 4 (four) hours as needed for wheezing. (Patient taking differently: Inhale 2 puffs into the lungs every 4 (four) hours as needed for wheezing or shortness of breath. ) 18 g 6  . ALPRAZolam (XANAX) 0.5 MG tablet Take 1 tablet (0.5 mg total) by mouth daily as needed for anxiety or sleep. 70 tablet 0  . bismuth subsalicylate (PEPTO BISMOL) 262 MG/15ML suspension Take 30 mLs by mouth 2 (two) times daily as needed for indigestion or diarrhea or loose stools.     Marland Kitchen. esomeprazole (NEXIUM) 20 MG capsule Take 20 mg by mouth at bedtime.     Marland Kitchen. HYDROcodone-acetaminophen (NORCO) 10-325 MG tablet TAKE 1 TABLET BY MOUTH 4 TIMES DAILY AS NEEDED    . lisinopril (PRINIVIL,ZESTRIL) 20 MG tablet Take 20 mg by mouth daily.    Marland Kitchen. loratadine (CLARITIN) 10 MG tablet Take 10 mg by mouth at bedtime.     . magnesium 30 MG tablet Take 30 mg by mouth 2 (two) times daily.    . meloxicam (MOBIC) 15 MG tablet Take 15 mg by mouth daily.    . mometasone-formoterol (DULERA) 100-5 MCG/ACT AERO Inhale 2  puffs into the lungs 2 (two) times daily.    . montelukast (SINGULAIR) 10 MG tablet Take 1 tablet by mouth daily. (Patient taking differently: Take 10 mg by mouth at bedtime. ) 90 tablet 1  . Multiple Vitamins-Minerals (MULTI COMPLETE PO) Take by mouth.    . oxyCODONE (OXY IR/ROXICODONE) 5 MG immediate release tablet Take 1-2 tablets (5-10 mg total) by mouth every 4 (four) hours as needed for moderate pain or severe pain. 60 tablet 0  . Vitamins A & D (VITAMIN A & D) ointment Apply 1 application topically as needed for dry  skin.    . clonazePAM (KLONOPIN) 0.5 MG tablet Take 2 tablets (1 mg total) by mouth at bedtime. 180 tablet 1  . lamoTRIgine (LAMICTAL) 100 MG tablet Take 1 tablet (100 mg total) by mouth 2 (two) times daily. 180 tablet 1  . rOPINIRole (REQUIP) 0.5 MG tablet TAKE 1 TABLET BY MOUTH TWICE DAILY AT  5PM  AND  AT  BEDTIME 180 tablet 1  . topiramate (TOPAMAX) 50 MG tablet 1 in the morning and 2 in the evening 270 tablet 1   No current facility-administered medications for this visit.     Medication Side Effects: Appetite Suppression and Other: ?  Allergies:  Allergies  Allergen Reactions  . Avocado Itching  . Penicillins Other (See Comments)    Unknown,childhood allergy  Has patient had a PCN reaction causing immediate rash, facial/tongue/throat swelling, SOB or lightheadedness with hypotension: Unknown Has patient had a PCN reaction causing severe rash involving mucus membranes or skin necrosis: Unknown Has patient had a PCN reaction that required hospitalization: Unknown Has patient had a PCN reaction occurring within the last 10 years: No If all of the above answers are "NO", then may proceed with Cephalosporin use.     Past Medical History:  Diagnosis Date  . Allergy   . Anemia   . Anxiety   . Arthritis   . Asthma   . Bipolar disorder ([place])   . Complication of anesthesia    woke up during surgery   . Depression   . GERD (gastroesophageal reflux disease)   . Hypertension   . Scoliosis     Family History  Problem Relation Age of Onset  . Hypertension Mother        Deceased  . Anxiety disorder Mother   . Parkinson's disease Father        Deceased  . Anxiety disorder Father   . Anxiety disorder Brother   . Anxiety disorder Son   . Diabetes Son        Deceased, 61  . Healthy Son     Social History   Socioeconomic History  . Marital status: Married    Spouse name: Not on file  . Number of children: 2  . Years of education: Not on file  . Highest education level:  Not on file  Occupational History  . Occupation: None  Social Needs  . Financial resource strain: Not on file  . Food insecurity    Worry: Not on file    Inability: Not on file  . Transportation needs    Medical: Not on file    Non-medical: Not on file  Tobacco Use  . Smoking status: Former Smoker    Types: Cigarettes    Quit date: 08/26/2008    Years since quitting: 10.8  . Smokeless tobacco: Never Used  Substance and Sexual Activity  . Alcohol use: No    Alcohol/week: 0.0 standard  drinks    Comment: hx of   . Drug use: Yes    Comment: hx of years ago - marijuana   . Sexual activity: Not on file  Lifestyle  . Physical activity    Days per week: Not on file    Minutes per session: Not on file  . Stress: Not on file  Relationships  . Social Musician on phone: Not on file    Gets together: Not on file    Attends religious service: Not on file    Active member of club or organization: Not on file    Attends meetings of clubs or organizations: Not on file    Relationship status: Not on file  . Intimate partner violence    Fear of current or ex partner: Not on file    Emotionally abused: Not on file    Physically abused: Not on file    Forced sexual activity: Not on file  Other Topics Concern  . Not on file  Social History Narrative   Cheryl Hoover was last working in March 2016 and laid off.  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   Highest level of education:  High school   Cheryl Hoover lives alone.      Past Medical History, Surgical history, Social history, and Family history were reviewed and updated as appropriate.   Please see review of systems for further details on the patient's review from today.   Objective:   Physical Exam:  There were no vitals taken for this visit.  Physical Exam Exam conducted with a chaperone present.  Constitutional:      General: Cheryl Hoover is not in acute distress.    Appearance: Cheryl Hoover is  well-developed.  Musculoskeletal:        General: No deformity.  Neurological:     Mental Status: Cheryl Hoover is alert and oriented to person, place, and time.     Cranial Nerves: No dysarthria.     Coordination: Coordination normal.  Psychiatric:        Attention and Perception: Attention and perception normal. Cheryl Hoover does not perceive auditory or visual hallucinations.        Mood and Affect: Mood is anxious. Mood is not depressed. Affect is not labile, blunt, angry or inappropriate.        Speech: Speech normal.        Behavior: Behavior normal. Behavior is cooperative.        Thought Content: Thought content normal. Thought content is not paranoid or delusional. Thought content does not include homicidal or suicidal ideation. Thought content does not include homicidal or suicidal plan.        Cognition and Memory: Cognition and memory normal.        Judgment: Judgment normal.     Comments: Chronic low grade irritability. A little down.     Lab Review:     Component Value Date/Time   NA 142 02/04/2018 0531   K 4.1 02/04/2018 0531   CL 113 (H) 02/04/2018 0531   CO2 22 02/04/2018 0531   GLUCOSE 121 (H) 02/04/2018 0531   BUN 22 (H) 02/04/2018 0531   CREATININE 1.10 (H) 02/04/2018 0531   CREATININE 1.05 12/15/2014 1424   CALCIUM 8.7 (L) 02/04/2018 0531   PROT 6.1 12/15/2014 1424   ALBUMIN 4.0 12/15/2014 1424   AST 13 12/15/2014 1424   ALT 10 12/15/2014 1424   ALKPHOS 72 12/15/2014 1424   BILITOT  0.6 12/15/2014 1424   GFRNONAA 53 (L) 02/04/2018 0531   GFRNONAA 59 (L) 12/15/2014 1424   GFRAA >60 02/04/2018 0531   GFRAA 68 12/15/2014 1424       Component Value Date/Time   WBC 7.8 02/04/2018 0531   RBC 3.50 (L) 02/04/2018 0531   HGB 10.9 (L) 02/04/2018 0531   HCT 33.5 (L) 02/04/2018 0531   PLT 188 02/04/2018 0531   MCV 95.7 02/04/2018 0531   MCH 31.1 02/04/2018 0531   MCHC 32.5 02/04/2018 0531   RDW 12.6 02/04/2018 0531    No results found for: POCLITH, LITHIUM   No results  found for: PHENYTOIN, PHENOBARB, VALPROATE, CBMZ   .res Assessment: Plan:    Bipolar II disorder (HCC) - Plan: lamoTRIgine (LAMICTAL) 100 MG tablet  Generalized anxiety disorder  Panic disorder with agoraphobia and moderate panic attacks - Plan: topiramate (TOPAMAX) 50 MG tablet  Restless leg syndrome, controlled - Plan: rOPINIRole (REQUIP) 0.5 MG tablet, clonazePAM (KLONOPIN) 0.5 MG tablet   Patient has a long history of bipolar disorder and has failed multiple medications as noted above.  Cheryl Hoover is fairly stable with regard to her mood.  Cheryl Hoover has chronic residual anxiety and social avoidance stomach agoraphobia but not actively having panic attacks.  Overall doing pretty well.  Cheryl Hoover complains of a poor appetite but has not lost significant weight.  We discussed that this could be a side effect of topiramate.  Cheryl Hoover is going to try reducing the morning dose to 50 mg in the morning and keep 100 mg in the evening and see if that improves her appetite during the day.  Cheryl Hoover is aware that this could increase her anxiety level and if it does let us know.  Let us know if it causes any mood instability.  Disc the importance of protein to maintain health mentally and otherwise.  Restless legs is managed fairly well most of the time with ropinirole  The only med change today is to decrease topiramate 100 mg tablets  To one half every morning and 1 every afternoon.  Cheryl Hoover didn't do this last time and appetite is still poor.    Follow-up 6 months  Meredith Staggers MD, DFAPA  Please see After Visit Summary for patient specific instructions.  Future Appointments  Date Time Provider Department Center  06/16/2019  3:00 PM Gelene Mink, NP [facility]    No orders of the defined types were placed in this encounter.     -------------------------------

## 2019-06-16 ENCOUNTER — Ambulatory Visit: Payer: Medicare HMO | Admitting: Gastroenterology

## 2019-06-22 DIAGNOSIS — Z0100 Encounter for examination of eyes and vision without abnormal findings: Secondary | ICD-10-CM | POA: Diagnosis not present

## 2019-06-22 DIAGNOSIS — H5200 Hypermetropia, unspecified eye: Secondary | ICD-10-CM | POA: Diagnosis not present

## 2019-07-06 DIAGNOSIS — Z5181 Encounter for therapeutic drug level monitoring: Secondary | ICD-10-CM | POA: Diagnosis not present

## 2019-07-06 DIAGNOSIS — Z79899 Other long term (current) drug therapy: Secondary | ICD-10-CM | POA: Diagnosis not present

## 2019-07-06 DIAGNOSIS — G894 Chronic pain syndrome: Secondary | ICD-10-CM | POA: Diagnosis not present

## 2019-07-14 DIAGNOSIS — Z9889 Other specified postprocedural states: Secondary | ICD-10-CM | POA: Diagnosis not present

## 2019-07-14 DIAGNOSIS — K219 Gastro-esophageal reflux disease without esophagitis: Secondary | ICD-10-CM | POA: Diagnosis not present

## 2019-07-14 DIAGNOSIS — Z1211 Encounter for screening for malignant neoplasm of colon: Secondary | ICD-10-CM | POA: Diagnosis not present

## 2019-07-20 DIAGNOSIS — R69 Illness, unspecified: Secondary | ICD-10-CM | POA: Diagnosis not present

## 2019-10-07 ENCOUNTER — Telehealth: Payer: Self-pay | Admitting: Psychiatry

## 2019-10-07 NOTE — Telephone Encounter (Signed)
Cheryl Hoover is inquiring about a referral to a therapist in the [place] area.

## 2019-10-08 NOTE — Telephone Encounter (Signed)
Call [facility]. They have office in [place].

## 2019-10-13 ENCOUNTER — Other Ambulatory Visit: Payer: Self-pay

## 2019-10-13 ENCOUNTER — Ambulatory Visit: Payer: Medicare HMO | Attending: Internal Medicine

## 2019-10-13 DIAGNOSIS — Z20822 Contact with and (suspected) exposure to covid-19: Secondary | ICD-10-CM

## 2019-10-14 LAB — NOVEL CORONAVIRUS, NAA

## 2019-10-18 DIAGNOSIS — H9203 Otalgia, bilateral: Secondary | ICD-10-CM | POA: Diagnosis not present

## 2019-10-18 DIAGNOSIS — E6609 Other obesity due to excess calories: Secondary | ICD-10-CM | POA: Diagnosis not present

## 2019-10-18 DIAGNOSIS — Z1389 Encounter for screening for other disorder: Secondary | ICD-10-CM | POA: Diagnosis not present

## 2019-10-18 DIAGNOSIS — Z6835 Body mass index (BMI) 35.0-35.9, adult: Secondary | ICD-10-CM | POA: Diagnosis not present

## 2019-10-18 DIAGNOSIS — H6523 Chronic serous otitis media, bilateral: Secondary | ICD-10-CM | POA: Diagnosis not present

## 2019-10-21 DIAGNOSIS — R42 Dizziness and giddiness: Secondary | ICD-10-CM | POA: Diagnosis not present

## 2019-10-21 DIAGNOSIS — H9313 Tinnitus, bilateral: Secondary | ICD-10-CM | POA: Diagnosis not present

## 2019-10-21 DIAGNOSIS — H903 Sensorineural hearing loss, bilateral: Secondary | ICD-10-CM | POA: Diagnosis not present

## 2019-10-21 DIAGNOSIS — H6983 Other specified disorders of Eustachian tube, bilateral: Secondary | ICD-10-CM | POA: Diagnosis not present

## 2019-10-25 ENCOUNTER — Ambulatory Visit: Payer: Medicare HMO | Attending: Internal Medicine

## 2019-10-25 ENCOUNTER — Other Ambulatory Visit: Payer: Self-pay

## 2019-10-25 DIAGNOSIS — Z20822 Contact with and (suspected) exposure to covid-19: Secondary | ICD-10-CM | POA: Diagnosis not present

## 2019-10-27 LAB — NOVEL CORONAVIRUS, NAA: SARS-CoV-2, NAA: NOT DETECTED

## 2019-11-17 DIAGNOSIS — R69 Illness, unspecified: Secondary | ICD-10-CM | POA: Diagnosis not present

## 2019-11-19 DIAGNOSIS — R42 Dizziness and giddiness: Secondary | ICD-10-CM | POA: Diagnosis not present

## 2019-11-19 DIAGNOSIS — H6983 Other specified disorders of Eustachian tube, bilateral: Secondary | ICD-10-CM | POA: Diagnosis not present

## 2019-11-22 DIAGNOSIS — H9319 Tinnitus, unspecified ear: Secondary | ICD-10-CM | POA: Diagnosis not present

## 2019-11-22 DIAGNOSIS — G894 Chronic pain syndrome: Secondary | ICD-10-CM | POA: Diagnosis not present

## 2019-11-22 DIAGNOSIS — M419 Scoliosis, unspecified: Secondary | ICD-10-CM | POA: Diagnosis not present

## 2019-12-14 ENCOUNTER — Ambulatory Visit: Payer: Medicare HMO | Admitting: Psychiatry

## 2019-12-18 ENCOUNTER — Other Ambulatory Visit: Payer: Self-pay | Admitting: Psychiatry

## 2019-12-18 DIAGNOSIS — G2581 Restless legs syndrome: Secondary | ICD-10-CM

## 2019-12-18 DIAGNOSIS — F5105 Insomnia due to other mental disorder: Secondary | ICD-10-CM

## 2019-12-20 NOTE — Telephone Encounter (Signed)
She has apt tomorrow morning

## 2019-12-21 ENCOUNTER — Ambulatory Visit (INDEPENDENT_AMBULATORY_CARE_PROVIDER_SITE_OTHER): Payer: Medicare HMO | Admitting: Psychiatry

## 2019-12-21 ENCOUNTER — Encounter: Payer: Self-pay | Admitting: Psychiatry

## 2019-12-21 DIAGNOSIS — F5105 Insomnia due to other mental disorder: Secondary | ICD-10-CM | POA: Diagnosis not present

## 2019-12-21 DIAGNOSIS — G2581 Restless legs syndrome: Secondary | ICD-10-CM | POA: Diagnosis not present

## 2019-12-21 DIAGNOSIS — F411 Generalized anxiety disorder: Secondary | ICD-10-CM

## 2019-12-21 DIAGNOSIS — R69 Illness, unspecified: Secondary | ICD-10-CM | POA: Diagnosis not present

## 2019-12-21 DIAGNOSIS — F4001 Agoraphobia with panic disorder: Secondary | ICD-10-CM

## 2019-12-21 DIAGNOSIS — F3181 Bipolar II disorder: Secondary | ICD-10-CM

## 2019-12-21 MED ORDER — LAMOTRIGINE 100 MG PO TABS: 100.0000 mg | ORAL_TABLET | Freq: Two times a day (BID) | ORAL | 1 refills | Status: DC

## 2019-12-21 MED ORDER — TOPIRAMATE 50 MG PO TABS: ORAL_TABLET | ORAL | 1 refills | Status: DC

## 2019-12-21 MED ORDER — TRAZODONE HCL 50 MG PO TABS: ORAL_TABLET | ORAL | 0 refills | Status: DC

## 2019-12-21 MED ORDER — CLONAZEPAM 0.5 MG PO TABS: 1.0000 mg | ORAL_TABLET | Freq: Every day | ORAL | 1 refills | Status: DC

## 2019-12-21 NOTE — Progress Notes (Signed)
Cheryl Hoover [id number] [birth year] 64 y.o.  Virtual Visit via [facility]  I connected with pt by WebEx and verified that I am speaking with the correct person using two identifiers.   I discussed the limitations, risks, security and privacy concerns of performing an evaluation and management service by [facility] and the availability of in person appointments. I also discussed with the patient that there may be a patient responsible charge related to this service. The patient expressed understanding and agreed to proceed.  I discussed the assessment and treatment plan with the patient. The patient was provided an opportunity to ask questions and all were answered. The patient agreed with the plan and demonstrated an understanding of the instructions.   The patient was advised to call back or seek an in-person evaluation if the symptoms worsen or if the condition fails to improve as anticipated.  I provided 30 minutes of video time during this encounter. The call started at 100 and ended at 1:30. The patient was located at home and the provider was located office.  Subjective:   Patient ID:  Cheryl Hoover is a 64 y.o. (DOB Feb 12, 1956) female.  Chief Complaint:  No chief complaint on file.   HPI Cheryl Hoover presents to the office today for follow-up of bipolar and anxiety.   seen April 2020.  Topiramate was reduced to 50 mg in the morning and 100 mg in the evening.  No other meds were changed.  Last seen October 2020.  The following was noted: Forgot to reduce the topiramate and has continued 100 mg BID.  Kind of down in the dumps.  Doesn't like being at home so much.  Not sure of the cause.  Things are a lot different for [place] bc so isolated and doing little.  Like to travel and can't now.  But did drive across the country and it was fun but not every day is like that.  Still tend to be short-tempered but Ok overall.  Low appetite.  Likes snacks.  Eats supper. Question if med  related. Sedentary.  Doesn't know how to lose weight.  Weight stable Rare Xanax.  Doesn't like to drive but will do it. The only med change today is to decrease topiramate 100 mg tablets  To one half every morning and 1 every afternoon.  She didn't do this last time and appetite is still poor.   December 21, 2019 appointment, the following is noted: Compliant with meds. Wants change in clonazepam.  Tried to reduce to 1 1/2 tablets at night and couldn't sleep.  Currently sleeping well.  Except for bad dreams for 6 mos.  Usually don't awaken her at night.  Spouse woke her recently bc of talking in bad dream.Spke with clinical pharmacist who suggested trying to stop.    No SE.   For awhile appetite was better but still don't eat well.  No desire for food daytime until night and poor choices longterm.  No weight changes.  Little depression.  Not bad.  Patient reports stable mood and denies or irritable moods.  Patient reports recent difficulty with anxiety mainly claustrophobia..  Patient denies difficulty with sleep initiation or maintenance. Poor appetite disturbance chronically.  Patient reports that energy and motivation have been chronically poor.  Patient denies any difficulty with concentration.  Patient denies any suicidal ideation.  No panic lately. No mania.  RLS managed.  Past Psychiatric Medication Trials: Wellbutrin, Pristiq, Nardil, Prozac, Paxil hypomania, Zoloft, imipramine, desipramine, Wellbutrin, Serzone 600  mg, buspirone, clonazepam, topiramate 100 mg twice daily, lamotrigine 100 mg twice daily, ropinirole,  carbamazepine,  Saphris, gabapentin, Latuda,    Review of Systems:  Review of Systems  Constitutional: Positive for appetite change. Negative for unexpected weight change.  HENT: Positive for tinnitus.   Musculoskeletal: Positive for arthralgias.  Neurological: Negative for tremors, weakness and headaches.    Medications: I have reviewed the patient's current  medications.  Current Outpatient Medications  Medication Sig Dispense Refill  . acetaminophen (TYLENOL) 500 MG tablet Take 2 tablets (1,000 mg total) by mouth every 8 (eight) hours. 30 tablet 0  . albuterol (VENTOLIN HFA) 108 (90 BASE) MCG/ACT inhaler Inhale 2 puffs into the lungs every 4 (four) hours as needed for wheezing. (Patient taking differently: Inhale 2 puffs into the lungs every 4 (four) hours as needed for wheezing or shortness of breath. ) 18 g 6  . ALPRAZolam (XANAX) 0.5 MG tablet Take 1 tablet (0.5 mg total) by mouth daily as needed for anxiety or sleep. 70 tablet 0  . bismuth subsalicylate (PEPTO BISMOL) 262 MG/15ML suspension Take 30 mLs by mouth 2 (two) times daily as needed for indigestion or diarrhea or loose stools.     . clonazePAM (KLONOPIN) 0.5 MG tablet Take 2 tablets (1 mg total) by mouth at bedtime. 180 tablet 1  . esomeprazole (NEXIUM) 20 MG capsule Take 20 mg by mouth at bedtime.     Marland Kitchen HYDROcodone-acetaminophen (NORCO) 10-325 MG tablet TAKE 1 TABLET BY MOUTH 4 TIMES DAILY AS NEEDED    . lamoTRIgine (LAMICTAL) 100 MG tablet Take 1 tablet (100 mg total) by mouth 2 (two) times daily. 180 tablet 1  . lisinopril (PRINIVIL,ZESTRIL) 20 MG tablet Take 20 mg by mouth daily.    Marland Kitchen loratadine (CLARITIN) 10 MG tablet Take 10 mg by mouth at bedtime.     . magnesium 30 MG tablet Take 30 mg by mouth 2 (two) times daily.    . meloxicam (MOBIC) 15 MG tablet Take 15 mg by mouth daily.    . mometasone-formoterol (DULERA) 100-5 MCG/ACT AERO Inhale 2 puffs into the lungs 2 (two) times daily.    . montelukast (SINGULAIR) 10 MG tablet Take 1 tablet by mouth daily. (Patient taking differently: Take 10 mg by mouth at bedtime. ) 90 tablet 1  . Multiple Vitamins-Minerals (MULTI COMPLETE PO) Take by mouth.    . oxyCODONE (OXY IR/ROXICODONE) 5 MG immediate release tablet Take 1-2 tablets (5-10 mg total) by mouth every 4 (four) hours as needed for moderate pain or severe pain. 60 tablet 0  .  rOPINIRole (REQUIP) 0.5 MG tablet TAKE 1 TABLET BY MOUTH TWICE DAILY AT  5PM  AND  AT  BEDTIME 180 tablet 1  . topiramate (TOPAMAX) 50 MG tablet 1 in the morning and 2 in the evening 270 tablet 1  . Vitamins A & D (VITAMIN A & D) ointment Apply 1 application topically as needed for dry skin.     No current facility-administered medications for this visit.    Medication Side Effects: Appetite Suppression and Other: ?  Allergies:  Allergies  Allergen Reactions  . Avocado Itching  . Penicillins Other (See Comments)    Unknown,childhood allergy  Has patient had a PCN reaction causing immediate rash, facial/tongue/throat swelling, SOB or lightheadedness with hypotension: Unknown Has patient had a PCN reaction causing severe rash involving mucus membranes or skin necrosis: Unknown Has patient had a PCN reaction that required hospitalization: Unknown Has patient had a PCN reaction  occurring within the last 10 years: No If all of the above answers are "NO", then may proceed with Cephalosporin use.     Past Medical History:  Diagnosis Date  . Allergy   . Anemia   . Anxiety   . Arthritis   . Asthma   . Bipolar disorder (HCC)   . Complication of anesthesia    woke up during surgery   . Depression   . GERD (gastroesophageal reflux disease)   . Hypertension   . Scoliosis     Family History  Problem Relation Age of Onset  . Hypertension Mother        Deceased  . Anxiety disorder Mother   . Parkinson's disease Father        Deceased  . Anxiety disorder Father   . Anxiety disorder Brother   . Anxiety disorder Son   . Diabetes Son        Deceased, [id number]  . Healthy Son     Social History   Socioeconomic History  . Marital status: Married    Spouse name: Not on file  . Number of children: 2  . Years of education: Not on file  . Highest education level: Not on file  Occupational History  . Occupation: None  Tobacco Use  . Smoking status: Former Smoker    Types: Cigarettes     Quit date: 08/26/2008    Years since quitting: 11.3  . Smokeless tobacco: Never Used  Substance and Sexual Activity  . Alcohol use: No    Alcohol/week: 0.0 standard drinks    Comment: hx of   . Drug use: Yes    Comment: hx of years ago - marijuana   . Sexual activity: Not on file  Other Topics Concern  . Not on file  Social History Narrative   She was last working in March 2016 and laid off.  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   Highest level of education:  High school   She lives alone.     Social Determinants of Health   Financial Resource Strain:   . Difficulty of Paying Living Expenses:   Food Insecurity:   . Worried About Programme researcher, broadcasting/film/videounning Out of Food in the Last Year:   . Baristaan Out of Food in the Last Year:   Transportation Needs:   . Freight forwarderLack of Transportation (Medical):   Marland Kitchen. Lack of Transportation (Non-Medical):   Physical Activity:   . Days of Exercise per Week:   . Minutes of Exercise per Session:   Stress:   . Feeling of Stress :   Social Connections:   . Frequency of Communication with Friends and Family:   . Frequency of Social Gatherings with Friends and Family:   . Attends Religious Services:   . Active Member of Clubs or Organizations:   . Attends BankerClub or Organization Meetings:   Marland Kitchen. Marital Status:   Intimate Partner Violence:   . Fear of Current or Ex-Partner:   . Emotionally Abused:   Marland Kitchen. Physically Abused:   . Sexually Abused:     Past Medical History, Surgical history, Social history, and Family history were reviewed and updated as appropriate.   Please see review of systems for further details on the patient's review from today.   Objective:   Physical Exam:  There were no vitals taken for this visit.  Physical Exam Exam conducted with a chaperone present.  Constitutional:      General:  She is not in acute distress.    Appearance: She is well-developed.  Musculoskeletal:        General: No deformity.   Neurological:     Mental Status: She is alert and oriented to person, place, and time.     Cranial Nerves: No dysarthria.     Coordination: Coordination normal.  Psychiatric:        Attention and Perception: Attention and perception normal. She does not perceive auditory or visual hallucinations.        Mood and Affect: Mood is anxious. Mood is not depressed. Affect is not labile, blunt, angry or inappropriate.        Speech: Speech normal.        Behavior: Behavior normal. Behavior is cooperative.        Thought Content: Thought content normal. Thought content is not paranoid or delusional. Thought content does not include homicidal or suicidal ideation. Thought content does not include homicidal or suicidal plan.        Cognition and Memory: Cognition and memory normal.        Judgment: Judgment normal.     Comments: Chronic low grade irritability. A little down.     Lab Review:     Component Value Date/Time   NA 142 02/04/2018 0531   K 4.1 02/04/2018 0531   CL 113 (H) 02/04/2018 0531   CO2 22 02/04/2018 0531   GLUCOSE 121 (H) 02/04/2018 0531   BUN 22 (H) 02/04/2018 0531   CREATININE 1.10 (H) 02/04/2018 0531   CREATININE 1.05 12/15/2014 1424   CALCIUM 8.7 (L) 02/04/2018 0531   PROT 6.1 12/15/2014 1424   ALBUMIN 4.0 12/15/2014 1424   AST 13 12/15/2014 1424   ALT 10 12/15/2014 1424   ALKPHOS 72 12/15/2014 1424   BILITOT 0.6 12/15/2014 1424   GFRNONAA 53 (L) 02/04/2018 0531   GFRNONAA 59 (L) 12/15/2014 1424   GFRAA >60 02/04/2018 0531   GFRAA 68 12/15/2014 1424       Component Value Date/Time   WBC 7.8 02/04/2018 0531   RBC 3.50 (L) 02/04/2018 0531   HGB 10.9 (L) 02/04/2018 0531   HCT 33.5 (L) 02/04/2018 0531   PLT 188 02/04/2018 0531   MCV 95.7 02/04/2018 0531   MCH 31.1 02/04/2018 0531   MCHC 32.5 02/04/2018 0531   RDW 12.6 02/04/2018 0531    No results found for: POCLITH, LITHIUM   No results found for: PHENYTOIN, PHENOBARB, VALPROATE, CBMZ    .res Assessment: Plan:    Bipolar II disorder (HCC)  Generalized anxiety disorder  Panic disorder with agoraphobia and moderate panic attacks  Restless leg syndrome, controlled  Insomnia due to mental condition   Patient has a long history of bipolar disorder and has failed multiple medications as noted above.  She is fairly stable with regard to her mood.  She has chronic residual anxiety and social avoidance stomach agoraphobia but not actively having panic attacks.  Overall doing pretty well.  She complains of a poor appetite but has not lost significant weight.  We discussed that this could be a side effect of topiramate.  She is going to try reducing the morning dose to 50 mg in the morning and keep 100 mg in the evening and see if that improves her appetite during the day.  She is aware that this could increase her anxiety level and if it does let us know.  Let us know if it causes any mood instability.  Disc the importance  of protein to maintain health mentally and otherwise.  We discussed the short-term risks associated with benzodiazepines including sedation and increased fall risk among others.  Discussed long-term side effect risk including dependence, potential withdrawal symptoms, and the potential eventual dose-related risk of dementia.  But recent studies from 2020 dispute this association between benzodiazepines and dementia risk. Newer studies in 2020 do not support an association with dementia.  Extensive discussion of this in response to her questions. Disc need for very slow taper for clonazepam. Extensive discussion.  Option trazodone to aid taper.  Restless legs is managed fairly well most of the time with ropinirole  Disc possibility of counseling.  The only med change today is to decrease topiramate 100 mg tablets  To one half every morning and 1 every afternoon.  She didn't do this last time and appetite is still poor.    Follow-up 6 months  Meredith Staggers MD,  [facility]  Please see After Visit Summary for patient specific instructions.  No future appointments.  No orders of the defined types were placed in this encounter.     -------------------------------

## 2019-12-24 DIAGNOSIS — I10 Essential (primary) hypertension: Secondary | ICD-10-CM | POA: Diagnosis not present

## 2019-12-24 DIAGNOSIS — J4520 Mild intermittent asthma, uncomplicated: Secondary | ICD-10-CM | POA: Diagnosis not present

## 2019-12-24 DIAGNOSIS — M170 Bilateral primary osteoarthritis of knee: Secondary | ICD-10-CM | POA: Diagnosis not present

## 2019-12-24 DIAGNOSIS — Z87891 Personal history of nicotine dependence: Secondary | ICD-10-CM | POA: Diagnosis not present

## 2020-01-14 DIAGNOSIS — Z6835 Body mass index (BMI) 35.0-35.9, adult: Secondary | ICD-10-CM | POA: Diagnosis not present

## 2020-01-14 DIAGNOSIS — K449 Diaphragmatic hernia without obstruction or gangrene: Secondary | ICD-10-CM | POA: Diagnosis not present

## 2020-01-14 DIAGNOSIS — K219 Gastro-esophageal reflux disease without esophagitis: Secondary | ICD-10-CM | POA: Diagnosis not present

## 2020-01-14 DIAGNOSIS — E6609 Other obesity due to excess calories: Secondary | ICD-10-CM | POA: Diagnosis not present

## 2020-01-17 DIAGNOSIS — R1013 Epigastric pain: Secondary | ICD-10-CM | POA: Diagnosis not present

## 2020-01-17 DIAGNOSIS — Z1211 Encounter for screening for malignant neoplasm of colon: Secondary | ICD-10-CM | POA: Diagnosis not present

## 2020-01-17 DIAGNOSIS — R634 Abnormal weight loss: Secondary | ICD-10-CM | POA: Diagnosis not present

## 2020-01-17 DIAGNOSIS — K219 Gastro-esophageal reflux disease without esophagitis: Secondary | ICD-10-CM | POA: Diagnosis not present

## 2020-01-17 DIAGNOSIS — R197 Diarrhea, unspecified: Secondary | ICD-10-CM | POA: Diagnosis not present

## 2020-01-20 ENCOUNTER — Ambulatory Visit: Payer: Medicare HMO | Admitting: Family Medicine

## 2020-01-24 DIAGNOSIS — I10 Essential (primary) hypertension: Secondary | ICD-10-CM | POA: Diagnosis not present

## 2020-01-24 DIAGNOSIS — M170 Bilateral primary osteoarthritis of knee: Secondary | ICD-10-CM | POA: Diagnosis not present

## 2020-01-24 DIAGNOSIS — J4520 Mild intermittent asthma, uncomplicated: Secondary | ICD-10-CM | POA: Diagnosis not present

## 2020-01-24 DIAGNOSIS — Z87891 Personal history of nicotine dependence: Secondary | ICD-10-CM | POA: Diagnosis not present

## 2020-01-25 DIAGNOSIS — K219 Gastro-esophageal reflux disease without esophagitis: Secondary | ICD-10-CM | POA: Diagnosis not present

## 2020-01-25 DIAGNOSIS — K449 Diaphragmatic hernia without obstruction or gangrene: Secondary | ICD-10-CM | POA: Diagnosis not present

## 2020-01-25 DIAGNOSIS — R12 Heartburn: Secondary | ICD-10-CM | POA: Diagnosis not present

## 2020-01-25 DIAGNOSIS — K319 Disease of stomach and duodenum, unspecified: Secondary | ICD-10-CM | POA: Diagnosis not present

## 2020-02-14 DIAGNOSIS — M419 Scoliosis, unspecified: Secondary | ICD-10-CM | POA: Diagnosis not present

## 2020-02-14 DIAGNOSIS — G894 Chronic pain syndrome: Secondary | ICD-10-CM | POA: Diagnosis not present

## 2020-02-14 DIAGNOSIS — M542 Cervicalgia: Secondary | ICD-10-CM | POA: Diagnosis not present

## 2020-02-23 DIAGNOSIS — K219 Gastro-esophageal reflux disease without esophagitis: Secondary | ICD-10-CM | POA: Diagnosis not present

## 2020-03-05 ENCOUNTER — Other Ambulatory Visit: Payer: Self-pay | Admitting: Psychiatry

## 2020-03-05 DIAGNOSIS — G2581 Restless legs syndrome: Secondary | ICD-10-CM

## 2020-03-06 DIAGNOSIS — M9901 Segmental and somatic dysfunction of cervical region: Secondary | ICD-10-CM | POA: Diagnosis not present

## 2020-03-06 DIAGNOSIS — M5033 Other cervical disc degeneration, cervicothoracic region: Secondary | ICD-10-CM | POA: Diagnosis not present

## 2020-03-08 DIAGNOSIS — M5033 Other cervical disc degeneration, cervicothoracic region: Secondary | ICD-10-CM | POA: Diagnosis not present

## 2020-03-08 DIAGNOSIS — M9901 Segmental and somatic dysfunction of cervical region: Secondary | ICD-10-CM | POA: Diagnosis not present

## 2020-03-09 DIAGNOSIS — M5033 Other cervical disc degeneration, cervicothoracic region: Secondary | ICD-10-CM | POA: Diagnosis not present

## 2020-03-09 DIAGNOSIS — M9901 Segmental and somatic dysfunction of cervical region: Secondary | ICD-10-CM | POA: Diagnosis not present

## 2020-03-15 DIAGNOSIS — M5033 Other cervical disc degeneration, cervicothoracic region: Secondary | ICD-10-CM | POA: Diagnosis not present

## 2020-03-15 DIAGNOSIS — M9901 Segmental and somatic dysfunction of cervical region: Secondary | ICD-10-CM | POA: Diagnosis not present

## 2020-03-16 DIAGNOSIS — M5033 Other cervical disc degeneration, cervicothoracic region: Secondary | ICD-10-CM | POA: Diagnosis not present

## 2020-03-16 DIAGNOSIS — M9901 Segmental and somatic dysfunction of cervical region: Secondary | ICD-10-CM | POA: Diagnosis not present

## 2020-03-21 DIAGNOSIS — M9901 Segmental and somatic dysfunction of cervical region: Secondary | ICD-10-CM | POA: Diagnosis not present

## 2020-03-21 DIAGNOSIS — M5033 Other cervical disc degeneration, cervicothoracic region: Secondary | ICD-10-CM | POA: Diagnosis not present

## 2020-03-22 DIAGNOSIS — M5033 Other cervical disc degeneration, cervicothoracic region: Secondary | ICD-10-CM | POA: Diagnosis not present

## 2020-03-22 DIAGNOSIS — M9901 Segmental and somatic dysfunction of cervical region: Secondary | ICD-10-CM | POA: Diagnosis not present

## 2020-03-23 DIAGNOSIS — M9901 Segmental and somatic dysfunction of cervical region: Secondary | ICD-10-CM | POA: Diagnosis not present

## 2020-03-23 DIAGNOSIS — M5033 Other cervical disc degeneration, cervicothoracic region: Secondary | ICD-10-CM | POA: Diagnosis not present

## 2020-03-24 DIAGNOSIS — I10 Essential (primary) hypertension: Secondary | ICD-10-CM | POA: Diagnosis not present

## 2020-03-24 DIAGNOSIS — M170 Bilateral primary osteoarthritis of knee: Secondary | ICD-10-CM | POA: Diagnosis not present

## 2020-03-24 DIAGNOSIS — Z87891 Personal history of nicotine dependence: Secondary | ICD-10-CM | POA: Diagnosis not present

## 2020-03-24 DIAGNOSIS — J4520 Mild intermittent asthma, uncomplicated: Secondary | ICD-10-CM | POA: Diagnosis not present

## 2020-03-28 DIAGNOSIS — M5033 Other cervical disc degeneration, cervicothoracic region: Secondary | ICD-10-CM | POA: Diagnosis not present

## 2020-03-28 DIAGNOSIS — M9901 Segmental and somatic dysfunction of cervical region: Secondary | ICD-10-CM | POA: Diagnosis not present

## 2020-03-29 DIAGNOSIS — M5033 Other cervical disc degeneration, cervicothoracic region: Secondary | ICD-10-CM | POA: Diagnosis not present

## 2020-03-29 DIAGNOSIS — M9901 Segmental and somatic dysfunction of cervical region: Secondary | ICD-10-CM | POA: Diagnosis not present

## 2020-03-29 DIAGNOSIS — R69 Illness, unspecified: Secondary | ICD-10-CM | POA: Diagnosis not present

## 2020-03-30 DIAGNOSIS — M5033 Other cervical disc degeneration, cervicothoracic region: Secondary | ICD-10-CM | POA: Diagnosis not present

## 2020-03-30 DIAGNOSIS — M9901 Segmental and somatic dysfunction of cervical region: Secondary | ICD-10-CM | POA: Diagnosis not present

## 2020-04-05 DIAGNOSIS — M9901 Segmental and somatic dysfunction of cervical region: Secondary | ICD-10-CM | POA: Diagnosis not present

## 2020-04-05 DIAGNOSIS — M5033 Other cervical disc degeneration, cervicothoracic region: Secondary | ICD-10-CM | POA: Diagnosis not present

## 2020-04-06 DIAGNOSIS — M9901 Segmental and somatic dysfunction of cervical region: Secondary | ICD-10-CM | POA: Diagnosis not present

## 2020-04-06 DIAGNOSIS — M5033 Other cervical disc degeneration, cervicothoracic region: Secondary | ICD-10-CM | POA: Diagnosis not present

## 2020-04-11 DIAGNOSIS — M9901 Segmental and somatic dysfunction of cervical region: Secondary | ICD-10-CM | POA: Diagnosis not present

## 2020-04-11 DIAGNOSIS — M5033 Other cervical disc degeneration, cervicothoracic region: Secondary | ICD-10-CM | POA: Diagnosis not present

## 2020-04-19 DIAGNOSIS — R69 Illness, unspecified: Secondary | ICD-10-CM | POA: Diagnosis not present

## 2020-04-25 DIAGNOSIS — M170 Bilateral primary osteoarthritis of knee: Secondary | ICD-10-CM | POA: Diagnosis not present

## 2020-04-25 DIAGNOSIS — Z87891 Personal history of nicotine dependence: Secondary | ICD-10-CM | POA: Diagnosis not present

## 2020-04-25 DIAGNOSIS — I10 Essential (primary) hypertension: Secondary | ICD-10-CM | POA: Diagnosis not present

## 2020-04-25 DIAGNOSIS — J4520 Mild intermittent asthma, uncomplicated: Secondary | ICD-10-CM | POA: Diagnosis not present

## 2020-05-25 DIAGNOSIS — I10 Essential (primary) hypertension: Secondary | ICD-10-CM | POA: Diagnosis not present

## 2020-05-25 DIAGNOSIS — J4520 Mild intermittent asthma, uncomplicated: Secondary | ICD-10-CM | POA: Diagnosis not present

## 2020-05-25 DIAGNOSIS — K219 Gastro-esophageal reflux disease without esophagitis: Secondary | ICD-10-CM | POA: Diagnosis not present

## 2020-06-01 ENCOUNTER — Other Ambulatory Visit: Payer: Self-pay | Admitting: Psychiatry

## 2020-06-01 DIAGNOSIS — F5105 Insomnia due to other mental disorder: Secondary | ICD-10-CM

## 2020-06-16 DIAGNOSIS — Z79899 Other long term (current) drug therapy: Secondary | ICD-10-CM | POA: Diagnosis not present

## 2020-06-16 DIAGNOSIS — Z5181 Encounter for therapeutic drug level monitoring: Secondary | ICD-10-CM | POA: Diagnosis not present

## 2020-06-16 DIAGNOSIS — G894 Chronic pain syndrome: Secondary | ICD-10-CM | POA: Diagnosis not present

## 2020-06-19 DIAGNOSIS — R69 Illness, unspecified: Secondary | ICD-10-CM | POA: Diagnosis not present

## 2020-06-24 DIAGNOSIS — K219 Gastro-esophageal reflux disease without esophagitis: Secondary | ICD-10-CM | POA: Diagnosis not present

## 2020-06-24 DIAGNOSIS — J4520 Mild intermittent asthma, uncomplicated: Secondary | ICD-10-CM | POA: Diagnosis not present

## 2020-06-24 DIAGNOSIS — I10 Essential (primary) hypertension: Secondary | ICD-10-CM | POA: Diagnosis not present

## 2020-06-30 DIAGNOSIS — Z1331 Encounter for screening for depression: Secondary | ICD-10-CM | POA: Diagnosis not present

## 2020-06-30 DIAGNOSIS — Z0000 Encounter for general adult medical examination without abnormal findings: Secondary | ICD-10-CM | POA: Diagnosis not present

## 2020-06-30 DIAGNOSIS — Z6834 Body mass index (BMI) 34.0-34.9, adult: Secondary | ICD-10-CM | POA: Diagnosis not present

## 2020-06-30 DIAGNOSIS — I10 Essential (primary) hypertension: Secondary | ICD-10-CM | POA: Diagnosis not present

## 2020-06-30 DIAGNOSIS — E7849 Other hyperlipidemia: Secondary | ICD-10-CM | POA: Diagnosis not present

## 2020-07-06 ENCOUNTER — Emergency Department (HOSPITAL_COMMUNITY): Payer: Medicare HMO

## 2020-07-06 ENCOUNTER — Encounter (HOSPITAL_COMMUNITY): Payer: Self-pay | Admitting: *Deleted

## 2020-07-06 ENCOUNTER — Emergency Department (HOSPITAL_COMMUNITY)
Admission: EM | Admit: 2020-07-06 | Discharge: 2020-07-06 | Disposition: A | Discharge status: Home / Self Care | Payer: Medicare HMO | Attending: Emergency Medicine | Admitting: Emergency Medicine

## 2020-07-06 ENCOUNTER — Other Ambulatory Visit: Payer: Self-pay

## 2020-07-06 DIAGNOSIS — J45909 Unspecified asthma, uncomplicated: Secondary | ICD-10-CM | POA: Insufficient documentation

## 2020-07-06 DIAGNOSIS — E6609 Other obesity due to excess calories: Secondary | ICD-10-CM | POA: Diagnosis not present

## 2020-07-06 DIAGNOSIS — R0789 Other chest pain: Secondary | ICD-10-CM | POA: Diagnosis present

## 2020-07-06 DIAGNOSIS — K449 Diaphragmatic hernia without obstruction or gangrene: Secondary | ICD-10-CM | POA: Diagnosis not present

## 2020-07-06 DIAGNOSIS — Z79899 Other long term (current) drug therapy: Secondary | ICD-10-CM | POA: Diagnosis not present

## 2020-07-06 DIAGNOSIS — Z20822 Contact with and (suspected) exposure to covid-19: Secondary | ICD-10-CM | POA: Insufficient documentation

## 2020-07-06 DIAGNOSIS — Z87891 Personal history of nicotine dependence: Secondary | ICD-10-CM | POA: Insufficient documentation

## 2020-07-06 DIAGNOSIS — R0989 Other specified symptoms and signs involving the circulatory and respiratory systems: Secondary | ICD-10-CM | POA: Diagnosis not present

## 2020-07-06 DIAGNOSIS — R079 Chest pain, unspecified: Secondary | ICD-10-CM | POA: Diagnosis not present

## 2020-07-06 DIAGNOSIS — Z6834 Body mass index (BMI) 34.0-34.9, adult: Secondary | ICD-10-CM | POA: Diagnosis not present

## 2020-07-06 DIAGNOSIS — Z96643 Presence of artificial hip joint, bilateral: Secondary | ICD-10-CM | POA: Insufficient documentation

## 2020-07-06 DIAGNOSIS — I10 Essential (primary) hypertension: Secondary | ICD-10-CM | POA: Diagnosis not present

## 2020-07-06 LAB — TROPONIN I (HIGH SENSITIVITY)
Troponin I (High Sensitivity): 3 ng/L (ref <18)
Troponin I (High Sensitivity): 4 ng/L (ref <18)

## 2020-07-06 LAB — CBC WITH DIFFERENTIAL/PLATELET
Abs Immature Granulocytes: 0.01 10*3/uL (ref 0.00–0.07)
Basophils Absolute: 0 10*3/uL (ref 0.0–0.1)
Basophils Relative: 0 %
Eosinophils Absolute: 0.1 10*3/uL (ref 0.0–0.5)
Eosinophils Relative: 1 %
HCT: 38.3 % (ref 36.0–46.0)
Hemoglobin: 12.3 g/dL (ref 12.0–15.0)
Immature Granulocytes: 0 %
Lymphocytes Relative: 32 %
Lymphs Abs: 1.6 10*3/uL (ref 0.7–4.0)
MCH: 31.6 pg (ref 26.0–34.0)
MCHC: 32.1 g/dL (ref 30.0–36.0)
MCV: 98.5 fL (ref 80.0–100.0)
Monocytes Absolute: 0.3 10*3/uL (ref 0.1–1.0)
Monocytes Relative: 5 %
Neutro Abs: 3 10*3/uL (ref 1.7–7.7)
Neutrophils Relative %: 62 %
Platelets: 222 10*3/uL (ref 150–400)
RBC: 3.89 MIL/uL (ref 3.87–5.11)
RDW: 12.8 % (ref 11.5–15.5)
WBC: 4.9 10*3/uL (ref 4.0–10.5)
nRBC: 0 % (ref 0.0–0.2)

## 2020-07-06 LAB — BASIC METABOLIC PANEL
Anion gap: 9 (ref 5–15)
BUN: 18 mg/dL (ref 8–23)
CO2: 23 mmol/L (ref 22–32)
Calcium: 9.3 mg/dL (ref 8.9–10.3)
Chloride: 106 mmol/L (ref 98–111)
Creatinine, Ser: 1.22 mg/dL — ABNORMAL HIGH (ref 0.44–1.00)
GFR, Estimated: 50 mL/min — ABNORMAL LOW (ref >60)
Glucose, Bld: 106 mg/dL — ABNORMAL HIGH (ref 70–99)
Potassium: 4 mmol/L (ref 3.5–5.1)
Sodium: 138 mmol/L (ref 135–145)

## 2020-07-06 LAB — RESPIRATORY PANEL BY RT PCR (FLU A&B, COVID)
Influenza A by PCR: NEGATIVE
Influenza B by PCR: NEGATIVE
SARS Coronavirus 2 by RT PCR: NEGATIVE

## 2020-07-06 MED ORDER — ASPIRIN 81 MG PO CHEW
324.0000 mg | CHEWABLE_TABLET | Freq: Once | ORAL | Status: AC
  Administered 2020-07-06: 324 mg via ORAL
  Filled 2020-07-06: qty 4

## 2020-07-06 NOTE — ED Triage Notes (Signed)
Chest pain x 2 days

## 2020-07-06 NOTE — Discharge Instructions (Addendum)
Follow-up with cardiology for stress test. Take baby aspirin daily until you are cleared or recommended otherwise. Return for new or worsening symptoms.

## 2020-07-06 NOTE — ED Provider Notes (Signed)
[facility] EMERGENCY DEPARTMENT Provider Note   CSN: [id number] Arrival date & time: 07/06/20  1047     History Chief Complaint  Patient presents with  . Chest Pain    Cheryl Hoover is a 64 y.o. female.  Patient with history of anemia, asthma, bipolar, high blood pressure sent over from primary care office for intermittent chest pain for 2 days.  No exertional symptoms, no diaphoresis.  No cardiac history.  No specific associations.  Lasts up to an hour at a time.  Patient has right jaw radiation of symptoms.  No radiation specifically through to the back.  Patient has minimal symptoms at this time.  No blood clot history, no recent surgeries.        Past Medical History:  Diagnosis Date  . Allergy   . Anemia   . Anxiety   . Arthritis   . Asthma   . Bipolar disorder (HCC)   . Complication of anesthesia    woke up during surgery   . Depression   . GERD (gastroesophageal reflux disease)   . Hypertension   . Scoliosis     Patient Active Problem List   Diagnosis Date Noted  . Bipolar 2 disorder (HCC) 09/23/2018  . Phobia 09/23/2018  . Obese 02/04/2018  . Overweight (BMI 25.0-29.9) 06/04/2017  . S/P right THA, AA 06/03/2017  . Lumbosacral radiculopathy 04/26/2015  . Degenerative lumbar spinal stenosis 04/26/2015  . Sliding hiatal hernia, recurrent 09/16/2011  . ANXIETY DEPRESSION 04/21/2009  . ASTHMA, MILD 04/21/2009  . GERD 04/21/2009  . CONSTIPATION 04/21/2009  . SCOLIOSIS 04/21/2009  . EARLY SATIETY 04/21/2009  . ABDOMINAL PAIN, EPIGASTRIC 04/21/2009  . ANOREXIA NERVOSA, HX OF 04/21/2009  . BULIMIA, HX OF 04/21/2009    Past Surgical History:  Procedure Laterality Date  . c-sections  1983  . CARPAL TUNNEL RELEASE    . CESAREAN SECTION  1982  . corrective eye surgery    . HERNIA REPAIR  04/2012   hiatial hernia  . lap nissen w/paraesophageal hernia repari  05-21-2010  . TONSILECTOMY, ADENOIDECTOMY, BILATERAL MYRINGOTOMY AND TUBES     patient  denies tubes in ears   . TONSILLECTOMY    . TOTAL HIP ARTHROPLASTY Right 06/03/2017   Procedure: RIGHT TOTAL HIP ARTHROPLASTY ANTERIOR APPROACH;  Surgeon: Durene Romans, MD;  Location: [facility];  Service: Orthopedics;  Laterality: Right;  70 mins  . TOTAL HIP ARTHROPLASTY Left 02/03/2018   Procedure: LEFT TOTAL HIP ARTHROPLASTY ANTERIOR APPROACH;  Surgeon: Durene Romans, MD;  Location: [facility];  Service: Orthopedics;  Laterality: Left;  70 mins     OB History   No obstetric history on file.     Family History  Problem Relation Age of Onset  . Hypertension Mother        Deceased  . Anxiety disorder Mother   . Parkinson's disease Father        Deceased  . Anxiety disorder Father   . Anxiety disorder Brother   . Anxiety disorder Son   . Diabetes Son        Deceased, 106  . Healthy Son     Social History   Tobacco Use  . Smoking status: Former Smoker    Types: Cigarettes    Quit date: 08/26/2008    Years since quitting: 11.8  . Smokeless tobacco: Never Used  Vaping Use  . Vaping Use: Never used  Substance Use Topics  . Alcohol use: No    Alcohol/week: 0.0 standard drinks  Comment: hx of   . Drug use: Yes    Comment: hx of years ago - marijuana     Home Medications Prior to Admission medications   Medication Sig Start Date End Date Taking? Authorizing Provider  acetaminophen (TYLENOL) 500 MG tablet Take 2 tablets (1,000 mg total) by mouth every 8 (eight) hours. 02/03/18   Lanney GinsBabish, Matthew, PA-C  albuterol (VENTOLIN HFA) 108 (90 BASE) MCG/ACT inhaler Inhale 2 puffs into the lungs every 4 (four) hours as needed for wheezing. Patient taking differently: Inhale 2 puffs into the lungs every 4 (four) hours as needed for wheezing or shortness of breath.  05/16/14   Elvina SidleLauenstein, Kurt, MD  ALPRAZolam Prudy Feeler(XANAX) 0.5 MG tablet Take 1 tablet (0.5 mg total) by mouth daily as needed for anxiety or sleep. 05/27/18   Cottle, Steva Readyarey G Jr., MD  bismuth subsalicylate (PEPTO BISMOL) 262 MG/15ML  suspension Take 30 mLs by mouth 2 (two) times daily as needed for indigestion or diarrhea or loose stools.     [provider]  clonazePAM (KLONOPIN) 0.5 MG tablet Take 2 tablets (1 mg total) by mouth at bedtime. 12/21/19   Cottle, Steva Readyarey G Jr., MD  esomeprazole (NEXIUM) 20 MG capsule Take 20 mg by mouth at bedtime.     [provider]  HYDROcodone-acetaminophen (NORCO) 10-325 MG tablet TAKE 1 TABLET BY MOUTH 4 TIMES DAILY AS NEEDED 04/12/19   [provider]  lamoTRIgine (LAMICTAL) 100 MG tablet Take 1 tablet (100 mg total) by mouth 2 (two) times daily. 12/21/19   Cottle, Steva Readyarey G Jr., MD  lisinopril (PRINIVIL,ZESTRIL) 20 MG tablet Take 20 mg by mouth daily.    [provider]  loratadine (CLARITIN) 10 MG tablet Take 10 mg by mouth at bedtime.     [provider]  magnesium 30 MG tablet Take 30 mg by mouth 2 (two) times daily.    [provider]  meloxicam (MOBIC) 15 MG tablet Take 15 mg by mouth daily. 03/20/19   [provider]  mometasone-formoterol (DULERA) 100-5 MCG/ACT AERO Inhale 2 puffs into the lungs 2 (two) times daily.    [provider]  montelukast (SINGULAIR) 10 MG tablet Take 1 tablet by mouth daily. Patient taking differently: Take 10 mg by mouth at bedtime.  12/15/14   Shade FloodGreene, Jeffrey R, MD  Multiple Vitamins-Minerals [facility](MULTI COMPLETE PO) Take by mouth.    [provider]  oxyCODONE (OXY IR/ROXICODONE) 5 MG immediate release tablet Take 1-2 tablets (5-10 mg total) by mouth every 4 (four) hours as needed for moderate pain or severe pain. 02/03/18   Lanney GinsBabish, Matthew, PA-C  rOPINIRole (REQUIP) 0.5 MG tablet TAKE 1 & 1/2 (ONE & ONE-HALF) TABLETS BY MOUTH DAILY AT 5 PM THEN 1 & 1/2 (ONE & ONE-HALF)  TABLET AT BEDTIME 03/06/20   Cottle, Steva Readyarey G Jr., MD  topiramate (TOPAMAX) 50 MG tablet 1 in the morning and 2 in the evening 12/21/19   Cottle, Steva Readyarey G Jr., MD  traZODone (DESYREL) 50 MG tablet TAKE 1 TO 2 TABLETS BY MOUTH  NIGHTLY AS NEEDED FOR SLEEP 06/02/20   Cottle, Steva Readyarey G Jr., MD  Vitamins A & D (VITAMIN A & D) ointment Apply 1 application topically as needed for dry skin.    [provider]  Omeprazole (PRILOSEC PO) Take 20.6 mg by mouth daily.  01/17/12  [provider]    Allergies    Avocado and Penicillins  Review of Systems   Review of Systems  Constitutional: Negative  for chills and fever.  HENT: Negative for congestion.   Eyes: Negative for visual disturbance.  Respiratory: Positive for shortness of breath. Negative for cough.   Cardiovascular: Positive for chest pain. Negative for leg swelling.  Gastrointestinal: Negative for abdominal pain and vomiting.  Genitourinary: Negative for dysuria and flank pain.  Musculoskeletal: Negative for back pain, neck pain and neck stiffness.  Skin: Negative for rash.  Neurological: Negative for light-headedness and headaches.    Physical Exam Updated Vital Signs BP (!) 137/91   Pulse 66   Temp 98.1 F (36.7 C) (Oral)   Resp 20   Ht 5\' 2"  (1.575 m)   Wt 84.8 kg   SpO2 100%   BMI 34.20 kg/m   Physical Exam Vitals and nursing note reviewed.  Constitutional:      Appearance: She is well-developed.  HENT:     Head: Normocephalic and atraumatic.  Eyes:     General:        Right eye: No discharge.        Left eye: No discharge.     Conjunctiva/sclera: Conjunctivae normal.  Neck:     Trachea: No tracheal deviation.  Cardiovascular:     Rate and Rhythm: Normal rate and regular rhythm.     Heart sounds: Normal heart sounds.  Pulmonary:     Effort: Pulmonary effort is normal.     Breath sounds: Normal breath sounds.  Abdominal:     General: There is no distension.     Palpations: Abdomen is soft.     Tenderness: There is no abdominal tenderness. There is no guarding.  Musculoskeletal:     Cervical back: Normal range of motion and neck supple.  Skin:    General: Skin is warm.     Findings: No rash.  Neurological:      Mental Status: She is alert and oriented to person, place, and time.     ED Results / Procedures / Treatments   Labs (all labs ordered are listed, but only abnormal results are displayed) Labs Reviewed  BASIC METABOLIC PANEL - Abnormal; Notable for the following components:      Result Value   Glucose, Bld 106 (*)    Creatinine, Ser 1.22 (*)    GFR, Estimated 50 (*)    All other components within normal limits  RESPIRATORY PANEL BY RT PCR (FLU A&B, COVID)  CBC WITH DIFFERENTIAL/PLATELET  TROPONIN I (HIGH SENSITIVITY)  TROPONIN I (HIGH SENSITIVITY)    EKG EKG Interpretation  Date/Time:  Thursday July 06 2020 11:01:07 EST Ventricular Rate:  64 PR Interval:    QRS Duration: 108 QT Interval:  425 QTC Calculation: 439 R Axis:   -11 Text Interpretation: Sinus rhythm Low voltage, precordial leads Confirmed by 03-21-2005 [phone]) on 07/06/2020 2:49:41 PM   Radiology DG Chest Portable 1 View  Result Date: 07/06/2020 CLINICAL DATA:  Two day history of chest pain. EXAM: PORTABLE CHEST 1 VIEW COMPARISON:  05/23/2010 FINDINGS: The heart is borderline enlarged. There is mild tortuosity and calcification of the thoracic aorta. Small hiatal hernia is suspected. No acute pulmonary findings. No infiltrates or effusions. No pneumothorax. The bony thorax is intact. IMPRESSION: 1. Borderline cardiac enlargement. 2. No acute pulmonary findings. Electronically Signed   By: 05/25/2010 M.D.   On: 07/06/2020 11:28    Procedures Procedures (including critical care time)  Medications Ordered in ED Medications  aspirin chewable tablet 324 mg (324 mg Oral Given 07/06/20 1152)    ED Course  I have reviewed the triage vital signs and the nursing notes.  Pertinent labs & imaging results that were available during my care of the patient were reviewed by me and considered in my medical decision making (see chart for details).    MDM Rules/Calculators/A&P                           Patient presents with intermittent chest pain.  Patient overall lower to low moderate risk based on risk factors and age.  No chest pain in the ER.  Delta troponin negative.  Basic blood work reviewed overall unremarkable.  Chest x-ray no acute findings except mild cardiac enlargement.  Discussed with cardiologist Dr Diona Browner who agrees close outpatient follow-up for stress test/echo.  EKG no ischemic findings reviewed.  Aspirin given.    Final Clinical Impression(s) / ED Diagnoses Final diagnoses:  Acute chest pain    Rx / DC Orders ED Discharge Orders    None       Blane Ohara, MD 07/06/20 1523

## 2020-07-10 DIAGNOSIS — R079 Chest pain, unspecified: Secondary | ICD-10-CM | POA: Diagnosis not present

## 2020-07-10 DIAGNOSIS — I10 Essential (primary) hypertension: Secondary | ICD-10-CM | POA: Diagnosis not present

## 2020-07-10 DIAGNOSIS — K219 Gastro-esophageal reflux disease without esophagitis: Secondary | ICD-10-CM | POA: Diagnosis not present

## 2020-07-10 DIAGNOSIS — R519 Headache, unspecified: Secondary | ICD-10-CM | POA: Diagnosis not present

## 2020-07-10 DIAGNOSIS — R42 Dizziness and giddiness: Secondary | ICD-10-CM | POA: Diagnosis not present

## 2020-07-10 DIAGNOSIS — Z6834 Body mass index (BMI) 34.0-34.9, adult: Secondary | ICD-10-CM | POA: Diagnosis not present

## 2020-07-11 ENCOUNTER — Other Ambulatory Visit: Payer: Self-pay | Admitting: Psychiatry

## 2020-07-11 DIAGNOSIS — F3181 Bipolar II disorder: Secondary | ICD-10-CM

## 2020-07-11 NOTE — Telephone Encounter (Signed)
Patient needs a refill on Lamotrigine called to [facility] in [place]. Last appt with Dr. Jennelle Human was 12/21/19. She said she would call back to make an appointment. I told her she needs to make a followup appointment.

## 2020-07-17 ENCOUNTER — Other Ambulatory Visit: Payer: Self-pay | Admitting: Psychiatry

## 2020-07-17 DIAGNOSIS — F5105 Insomnia due to other mental disorder: Secondary | ICD-10-CM

## 2020-07-21 ENCOUNTER — Other Ambulatory Visit: Payer: Self-pay | Admitting: Psychiatry

## 2020-07-21 DIAGNOSIS — G2581 Restless legs syndrome: Secondary | ICD-10-CM

## 2020-07-24 ENCOUNTER — Ambulatory Visit: Payer: Medicare HMO | Admitting: Cardiology

## 2020-07-24 ENCOUNTER — Other Ambulatory Visit: Payer: Self-pay

## 2020-07-24 VITALS — BP 110/68 | HR 85 | Ht 63.0 in | Wt 185.0 lb

## 2020-07-24 DIAGNOSIS — R072 Precordial pain: Secondary | ICD-10-CM

## 2020-07-24 DIAGNOSIS — R0789 Other chest pain: Secondary | ICD-10-CM

## 2020-07-24 DIAGNOSIS — I10 Essential (primary) hypertension: Secondary | ICD-10-CM | POA: Diagnosis not present

## 2020-07-24 NOTE — Patient Instructions (Addendum)
Medication Instructions:  Your physician recommends that you continue on your current medications as directed. Please refer to the Current Medication list given to you today.  Labwork: None ordered.  Testing/Procedures: Your physician has requested that you have an echocardiogram. Echocardiography is a painless test that uses sound waves to create images of your heart. It provides your doctor with information about the size and shape of your heart and how well your heart's chambers and valves are working. This procedure takes approximately one hour. There are no restrictions for this procedure.  Please schedule for ECHO  Your physician has requested that you have en exercise stress myoview.   Please schedule for exercise nuclear stress test.   Follow-Up: Your physician wants you to follow-up in: 6-8 weeks with Dr. Lalla Brothers.     Any Other Special Instructions Will Be Listed Below (If Applicable).  If you need a refill on your cardiac medications before your next appointment, please call your pharmacy.

## 2020-07-24 NOTE — Progress Notes (Signed)
Cheryl Hoover:    Date:  07/24/2020   ID:  EUFEMIA PRINDLE, DOB [birth year], MRN [id number]  PCP:  Ladon Applebaum  [facility] Cardiologist:  No primary care provider on file.  [facility] Electrophysiologist:  Lanier Prude, MD   Referring MD: Avis Epley, PA*   Chief Complaint: Chest pain  History of Present Illness:    Cheryl Hoover is a 64 y.o. female who presents for an evaluation of chest pain at the request of Terie Purser, PA-C. Their medical history includes bipolar disorder, anxiety, GERD, hypertension.  The patient was seen in the emergency department July 06, 2020 at [facility].  She presented there reporting intermittent chest pain lasting 2 days.  There was no exertional component to the symptoms.  She described pain lasting up to 1 hour at a time.  The pain radiated to the jaw.  EKG in the emergency department was not suggestive of ischemia.  High-sensitivity troponins were checked x2 and were normal (3 and 4).  The rest of her blood work was personally reviewed and within normal limits.  Past Medical History:  Diagnosis Date  . Allergy   . Anemia   . Anxiety   . Arthritis   . Asthma   . Bipolar disorder (HCC)   . Complication of anesthesia    woke up during surgery   . Depression   . GERD (gastroesophageal reflux disease)   . Hypertension   . Scoliosis     Past Surgical History:  Procedure Laterality Date  . c-sections  38  . CARPAL TUNNEL RELEASE    . CESAREAN SECTION  1982  . corrective eye surgery    . HERNIA REPAIR  04/2012   hiatial hernia  . lap nissen w/paraesophageal hernia repari  05-21-2010  . TONSILECTOMY, ADENOIDECTOMY, BILATERAL MYRINGOTOMY AND TUBES     patient denies tubes in ears   . TONSILLECTOMY    . TOTAL HIP ARTHROPLASTY Right 06/03/2017   Procedure: RIGHT TOTAL HIP ARTHROPLASTY ANTERIOR APPROACH;  Surgeon: Durene Romans, MD;  Location: [facility];  Service: Orthopedics;   Laterality: Right;  70 mins  . TOTAL HIP ARTHROPLASTY Left 02/03/2018   Procedure: LEFT TOTAL HIP ARTHROPLASTY ANTERIOR APPROACH;  Surgeon: Durene Romans, MD;  Location: [facility];  Service: Orthopedics;  Laterality: Left;  70 mins    Current Medications: Current Meds  Medication Sig  . acetaminophen (TYLENOL) 500 MG tablet Take 2 tablets (1,000 mg total) by mouth every 8 (eight) hours.  Marland Kitchen albuterol (VENTOLIN HFA) 108 (90 BASE) MCG/ACT inhaler Inhale 2 puffs into the lungs every 4 (four) hours as needed for wheezing. (Patient taking differently: Inhale 2 puffs into the lungs every 4 (four) hours as needed for wheezing or shortness of breath. )  . ALPRAZolam (XANAX) 0.5 MG tablet Take 1 tablet (0.5 mg total) by mouth daily as needed for anxiety or sleep.  Marland Kitchen bismuth subsalicylate (PEPTO BISMOL) 262 MG/15ML suspension Take 30 mLs by mouth 2 (two) times daily as needed for indigestion or diarrhea or loose stools.   Marland Kitchen esomeprazole (NEXIUM) 20 MG capsule Take 20 mg by mouth at bedtime.   Marland Kitchen HYDROcodone-acetaminophen (NORCO) 10-325 MG tablet TAKE 1 TABLET BY MOUTH 4 TIMES DAILY AS NEEDED  . lamoTRIgine (LAMICTAL) 100 MG tablet Take 1 tablet by mouth twice daily  . lisinopril (PRINIVIL,ZESTRIL) 20 MG tablet Take 20 mg by mouth daily.  Marland Kitchen loratadine (CLARITIN) 10 MG tablet Take 10 mg by mouth  at bedtime.   . magnesium 30 MG tablet Take 30 mg by mouth 2 (two) times daily.  . meloxicam (MOBIC) 15 MG tablet Take 15 mg by mouth daily.  . mometasone-formoterol (DULERA) 100-5 MCG/ACT AERO Inhale 2 puffs into the lungs 2 (two) times daily.  . montelukast (SINGULAIR) 10 MG tablet Take 1 tablet by mouth daily. (Patient taking differently: Take 10 mg by mouth at bedtime. )  . Multiple Vitamins-Minerals (MULTI COMPLETE PO) Take by mouth.  Marland Kitchen rOPINIRole (REQUIP) 0.5 MG tablet TAKE 1 & 1/2 (ONE & ONE-HALF) TABLETS BY MOUTH DAILY AT 5 PM THEN 1 & 1/2 (ONE & ONE-HALF)  TABLET AT BEDTIME  . topiramate (TOPAMAX) 50 MG tablet  1 in the morning and 2 in the evening  . traZODone (DESYREL) 50 MG tablet TAKE 1 TO 2 TABLETS BY MOUTH NIGHTLY AS NEEDED FOR SLEEP  . Vitamins A & D (VITAMIN A & D) ointment Apply 1 application topically as needed for dry skin.     Allergies:   Avocado and Penicillins   Social History   Socioeconomic History  . Marital status: Married    Spouse name: Not on file  . Number of children: 2  . Years of education: Not on file  . Highest education level: Not on file  Occupational History  . Occupation: None  Tobacco Use  . Smoking status: Former Smoker    Types: Cigarettes    Quit date: 08/26/2008    Years since quitting: 11.9  . Smokeless tobacco: Never Used  Vaping Use  . Vaping Use: Never used  Substance and Sexual Activity  . Alcohol use: No    Alcohol/week: 0.0 standard drinks    Comment: hx of   . Drug use: Yes    Comment: hx of years ago - marijuana   . Sexual activity: Not on file  Other Topics Concern  . Not on file  Social History Narrative   She was last working in March 2016 and laid off.  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   Highest level of education:  High school   She lives alone.     Social Determinants of Health   Financial Resource Strain:   . Difficulty of Paying Living Expenses: Not on file  Food Insecurity:   . Worried About Programme researcher, broadcasting/film/video in the Last Year: Not on file  . Ran Out of Food in the Last Year: Not on file  Transportation Needs:   . Lack of Transportation (Medical): Not on file  . Lack of Transportation (Non-Medical): Not on file  Physical Activity:   . Days of Exercise per Week: Not on file  . Minutes of Exercise per Session: Not on file  Stress:   . Feeling of Stress : Not on file  Social Connections:   . Frequency of Communication with Friends and Family: Not on file  . Frequency of Social Gatherings with Friends and Family: Not on file  . Attends Religious Services: Not on file    . Active Member of Clubs or Organizations: Not on file  . Attends Banker Meetings: Not on file  . Marital Status: Not on file     Family History: The patient's family history includes Anxiety disorder in her brother, father, mother, and son; Diabetes in her son; Healthy in her son; Hypertension in her mother; Parkinson's disease in her father.  ROS:   Please see the history of  present illness.    All other systems reviewed and are negative.  EKGs/Labs/Other Studies Reviewed:    The following studies were reviewed today: Emergency department records, prior EKGs  July 07, 2020 EKG personally reviewed  EKG:  The ekg ordered today demonstrates sinus rhythm with an incomplete right bundle branch block in lead V1  Recent Labs: 07/06/2020: BUN 18; Creatinine, Ser 1.22; Hemoglobin 12.3; Platelets 222; Potassium 4.0; Sodium 138  Recent Lipid Panel    Component Value Date/Time   CHOL 165 12/15/2014 1424   TRIG 246 (H) 12/15/2014 1424   HDL 39 (L) 12/15/2014 1424   CHOLHDL 4.2 12/15/2014 1424   VLDL 49 (H) 12/15/2014 1424   LDLCALC 77 12/15/2014 1424    Physical Exam:    VS:  BP 110/68   Pulse 85   Ht 5\' 3"  (1.6 m)   Wt 185 lb (83.9 kg)   SpO2 98%   BMI 32.77 kg/m     Wt Readings from Last 3 Encounters:  07/24/20 185 lb (83.9 kg)  07/06/20 187 lb (84.8 kg)  02/03/18 186 lb (84.4 kg)     GEN:  Well nourished, well developed in no acute distress.  Obese. HEENT: Normal NECK: No JVD; No carotid bruits LYMPHATICS: No lymphadenopathy CARDIAC: RRR, no murmurs, rubs, gallops RESPIRATORY:  Clear to auscultation without rales, wheezing or rhonchi  ABDOMEN: Soft, non-tender, non-distended MUSCULOSKELETAL:  No edema; No deformity  SKIN: Warm and dry NEUROLOGIC:  Alert and oriented x 3 PSYCHIATRIC:  Normal affect   ASSESSMENT:    1. Atypical chest pain   2. Primary hypertension   3. Precordial pain    PLAN:    In order of problems listed  above:  1. Atypical chest pain With recent emergency department visit and evaluation.  Evaluation emergency department was not suggestive of ischemia given normal high-sensitivity troponins x2 and a nonischemic EKG. We will plan on doing an exercise nuclear stress test and echocardiogram to assess for structural heart disease given risk factors.   2.  Hypertension Controlled during today's visit.  Continue current regimen taking.   Follow-up in 6 to 8 weeks.   Medication Adjustments/Labs and Tests Ordered: Current medicines are reviewed at length with the patient today.  Concerns regarding medicines are outlined above.  Orders Placed This Encounter  Procedures  . Myocardial Perfusion Imaging  . EKG 12-Lead  . ECHOCARDIOGRAM COMPLETE   No orders of the defined types were placed in this encounter.    Signed, 04/05/18, MD, [facility]  07/24/2020 3:08 PM    [facility]

## 2020-07-28 ENCOUNTER — Ambulatory Visit: Payer: Medicare HMO | Admitting: Cardiology

## 2020-08-08 NOTE — Addendum Note (Signed)
Addended by: Roney Mans A on: 08/08/2020 09:34 AM   Modules accepted: Orders

## 2020-08-09 ENCOUNTER — Telehealth: Payer: Self-pay

## 2020-08-09 DIAGNOSIS — R0789 Other chest pain: Secondary | ICD-10-CM

## 2020-08-14 ENCOUNTER — Other Ambulatory Visit: Payer: Self-pay

## 2020-08-14 ENCOUNTER — Other Ambulatory Visit (HOSPITAL_COMMUNITY)
Admission: RE | Admit: 2020-08-14 | Discharge: 2020-08-14 | Disposition: A | Discharge status: Home / Self Care | Payer: Medicare HMO | Source: Ambulatory Visit | Attending: Cardiology | Admitting: Cardiology

## 2020-08-14 DIAGNOSIS — Z20822 Contact with and (suspected) exposure to covid-19: Secondary | ICD-10-CM | POA: Diagnosis not present

## 2020-08-14 DIAGNOSIS — Z01812 Encounter for preprocedural laboratory examination: Secondary | ICD-10-CM | POA: Insufficient documentation

## 2020-08-14 LAB — SARS CORONAVIRUS 2 (TAT 6-24 HRS): SARS Coronavirus 2: NEGATIVE

## 2020-08-14 NOTE — Telephone Encounter (Signed)
signed

## 2020-08-16 ENCOUNTER — Telehealth (HOSPITAL_COMMUNITY): Payer: Self-pay

## 2020-08-16 NOTE — Telephone Encounter (Signed)
Spoke with the patient, detailed instructions given. She stated that she would be here for her test. Asked to call back with any questions. S. EMTP 

## 2020-08-17 ENCOUNTER — Ambulatory Visit (HOSPITAL_COMMUNITY): Payer: Medicare HMO

## 2020-08-17 ENCOUNTER — Telehealth (HOSPITAL_COMMUNITY): Payer: Self-pay | Admitting: Cardiology

## 2020-08-17 NOTE — Telephone Encounter (Signed)
Patient called and cancelled Myoview and Echocarddiogram for the reason below: Patient (pt is sick (sore throat), pt wants to cancel for the mean time and will cb to r/s) per Angeline Hammer   Orders will be removed form the ECHO and NUC WQ and when patient calls back to reschedule we will reinstate the order.  Thank you.

## 2020-08-31 ENCOUNTER — Other Ambulatory Visit: Payer: Self-pay | Admitting: Psychiatry

## 2020-08-31 DIAGNOSIS — F5105 Insomnia due to other mental disorder: Secondary | ICD-10-CM

## 2020-09-04 ENCOUNTER — Other Ambulatory Visit: Payer: Self-pay | Admitting: Psychiatry

## 2020-09-04 DIAGNOSIS — F5105 Insomnia due to other mental disorder: Secondary | ICD-10-CM

## 2020-09-06 ENCOUNTER — Encounter: Payer: Self-pay | Admitting: Psychiatry

## 2020-09-06 ENCOUNTER — Ambulatory Visit (INDEPENDENT_AMBULATORY_CARE_PROVIDER_SITE_OTHER): Payer: Medicare HMO | Admitting: Psychiatry

## 2020-09-06 ENCOUNTER — Other Ambulatory Visit: Payer: Self-pay

## 2020-09-06 DIAGNOSIS — F5105 Insomnia due to other mental disorder: Secondary | ICD-10-CM

## 2020-09-06 DIAGNOSIS — F411 Generalized anxiety disorder: Secondary | ICD-10-CM | POA: Diagnosis not present

## 2020-09-06 DIAGNOSIS — F3181 Bipolar II disorder: Secondary | ICD-10-CM

## 2020-09-06 DIAGNOSIS — G2581 Restless legs syndrome: Secondary | ICD-10-CM | POA: Diagnosis not present

## 2020-09-06 DIAGNOSIS — F4001 Agoraphobia with panic disorder: Secondary | ICD-10-CM | POA: Diagnosis not present

## 2020-09-06 DIAGNOSIS — R69 Illness, unspecified: Secondary | ICD-10-CM | POA: Diagnosis not present

## 2020-09-06 MED ORDER — ROPINIROLE HCL 0.5 MG PO TABS: ORAL_TABLET | ORAL | 1 refills | Status: DC

## 2020-09-06 MED ORDER — TOPIRAMATE 50 MG PO TABS: ORAL_TABLET | ORAL | 1 refills | Status: DC

## 2020-09-06 MED ORDER — TRAZODONE HCL 100 MG PO TABS: 100.0000 mg | ORAL_TABLET | Freq: Every day | ORAL | 1 refills | Status: DC

## 2020-09-06 MED ORDER — ALPRAZOLAM 0.5 MG PO TABS: 0.5000 mg | ORAL_TABLET | Freq: Every day | ORAL | 0 refills | Status: DC | PRN

## 2020-09-06 MED ORDER — LAMOTRIGINE 100 MG PO TABS: 100.0000 mg | ORAL_TABLET | Freq: Two times a day (BID) | ORAL | 1 refills | Status: DC

## 2020-09-06 NOTE — Progress Notes (Signed)
Cheryl Hoover [id number] 1956/01/26 65 y.o.   Subjective:   Patient ID:  Cheryl Hoover is a 65 y.o. (DOB 1956/01/26) female.  Chief Complaint:  Chief Complaint  Patient presents with  . Follow-up  . Anxiety  . Depression    HPI Cheryl Hoover presents to the office today for follow-up of bipolar and anxiety.   seen April 2020.  Topiramate was reduced to 50 mg in the morning and 100 mg in the evening.  No other meds were changed.  seen October 2020.  The following was noted: Forgot to reduce the topiramate and has continued 100 mg BID.  12/21/19 appt noted the following without med changes except as noted: Kind of down in the dumps.  Doesn't like being at home so much.  Not sure of the cause.  Things are a lot different for us bc so isolated and doing little.  Like to travel and can't now.  But did drive across the country and it was fun but not every day is like that.  Still tend to be short-tempered but Ok overall.  Low appetite.  Likes snacks.  Eats supper. Question if med related. Sedentary.  Doesn't know how to lose weight.  Weight stable Rare Xanax.  Doesn't like to drive but will do it. The only med change today is to decrease topiramate 100 mg tablets  To one half every morning and 1 every afternoon.  She didn't do this last time and appetite is still poor.   December 21, 2019 appointment, the following is noted: Compliant with meds. Wants change in clonazepam.  Tried to reduce to 1 1/2 tablets at night and couldn't sleep.  Currently sleeping well.  Except for bad dreams for 6 mos.  Usually don't awaken her at night.  Spouse woke her recently bc of talking in bad dream.Spke with clinical pharmacist who suggested trying to stop.    No SE.   For awhile appetite was better but still don't eat well.  No desire for food daytime until night and poor choices longterm.  No weight changes.  Little depression.  Not bad. Plan: The only med change today is to decrease topiramate  100 mg tablets  To one half every morning and 1 every afternoon.  She didn't do this last time and appetite is still poor.   09/06/20 appt with following noted: Rarely takes alprazolam. Stopped clonazepam and takes 100 mg trazodone and sleeps well.  Clonazepam hard to stop with WD and reduced later by 1/4 tablet every 2 weeks and took a couple of mos to get used to the change.  Patient reports stable mood and denies or irritable moods.  Patient reports recent difficulty with anxiety mainly claustrophobia.. Occ bouts of feeling on edge of anxiety, speeded up but not really manic and not severe.    Patient denies difficulty with sleep initiation or maintenance. Poor appetite disturbance chronically without change.  Patient reports that energy and motivation have been chronically poor.  Patient denies any difficulty with concentration.  Patient denies any suicidal ideation.  No panic lately. No mania.  RLS managed.  Past Psychiatric Medication Trials: Wellbutrin, Pristiq, Nardil, Prozac, Paxil hypomania, Zoloft, imipramine, desipramine, Wellbutrin, Serzone 600 mg, buspirone, clonazepam, topiramate 100 mg twice daily, lamotrigine 100 mg twice daily, ropinirole,  carbamazepine,  Saphris, gabapentin, Latuda,    Review of Systems:  Review of Systems  Constitutional: Positive for appetite change. Negative for unexpected weight change.  HENT: Positive for tinnitus.  Cardiovascular: Negative for palpitations.  Musculoskeletal: Positive for arthralgias.  Neurological: Negative for tremors, weakness and headaches.    Medications: I have reviewed the patient's current medications.  Current Outpatient Medications  Medication Sig Dispense Refill  . acetaminophen (TYLENOL) 500 MG tablet Take 2 tablets (1,000 mg total) by mouth every 8 (eight) hours. 30 tablet 0  . albuterol (VENTOLIN HFA) 108 (90 BASE) MCG/ACT inhaler Inhale 2 puffs into the lungs every 4 (four) hours as needed for wheezing. (Patient taking  differently: Inhale 2 puffs into the lungs every 4 (four) hours as needed for wheezing or shortness of breath.) 18 g 6  . bismuth subsalicylate (PEPTO BISMOL) 262 MG/15ML suspension Take 30 mLs by mouth 2 (two) times daily as needed for indigestion or diarrhea or loose stools.     Marland Kitchen. esomeprazole (NEXIUM) 20 MG capsule Take 20 mg by mouth at bedtime.     Marland Kitchen. HYDROcodone-acetaminophen (NORCO) 10-325 MG tablet TAKE 1 TABLET BY MOUTH 4 TIMES DAILY AS NEEDED    . lisinopril (PRINIVIL,ZESTRIL) 20 MG tablet Take 20 mg by mouth daily.    Marland Kitchen. loratadine (CLARITIN) 10 MG tablet Take 10 mg by mouth at bedtime.     . magnesium 30 MG tablet Take 30 mg by mouth 2 (two) times daily.    . meloxicam (MOBIC) 15 MG tablet Take 15 mg by mouth daily.    . mometasone-formoterol (DULERA) 100-5 MCG/ACT AERO Inhale 2 puffs into the lungs 2 (two) times daily.    . montelukast (SINGULAIR) 10 MG tablet Take 1 tablet by mouth daily. (Patient taking differently: Take 10 mg by mouth at bedtime.) 90 tablet 1  . Multiple Vitamins-Minerals (MULTI COMPLETE PO) Take by mouth.    . Vitamins A & D (VITAMIN A & D) ointment Apply 1 application topically as needed for dry skin.    Marland Kitchen. ALPRAZolam (XANAX) 0.5 MG tablet Take 1 tablet (0.5 mg total) by mouth daily as needed for anxiety or sleep. 60 tablet 0  . lamoTRIgine (LAMICTAL) 100 MG tablet Take 1 tablet (100 mg total) by mouth 2 (two) times daily. 180 tablet 1  . rOPINIRole (REQUIP) 0.5 MG tablet TAKE 1 & 1/2 (ONE & ONE-HALF) TABLETS BY MOUTH DAILY AT 5 PM THEN 1 & 1/2 (ONE & ONE-HALF)  TABLET AT BEDTIME 270 tablet 1  . topiramate (TOPAMAX) 50 MG tablet 1 in the morning and 2 in the evening 270 tablet 1  . traZODone (DESYREL) 100 MG tablet Take 1 tablet (100 mg total) by mouth at bedtime. 90 tablet 1   No current facility-administered medications for this visit.    Medication Side Effects: Appetite Suppression and Other: ?  Allergies:  Allergies  Allergen Reactions  . Avocado Itching   . Penicillins Other (See Comments)    Unknown,childhood allergy  Has patient had a PCN reaction causing immediate rash, facial/tongue/throat swelling, SOB or lightheadedness with hypotension: Unknown Has patient had a PCN reaction causing severe rash involving mucus membranes or skin necrosis: Unknown Has patient had a PCN reaction that required hospitalization: Unknown Has patient had a PCN reaction occurring within the last 10 years: No If all of the above answers are "NO", then may proceed with Cephalosporin use.     Past Medical History:  Diagnosis Date  . Allergy   . Anemia   . Anxiety   . Arthritis   . Asthma   . Bipolar disorder (HCC)   . Complication of anesthesia    woke up during surgery   .  Depression   . GERD (gastroesophageal reflux disease)   . Hypertension   . Scoliosis     Family History  Problem Relation Age of Onset  . Hypertension Mother        Deceased  . Anxiety disorder Mother   . Parkinson's disease Father        Deceased  . Anxiety disorder Father   . Anxiety disorder Brother   . Anxiety disorder Son   . Diabetes Son        Deceased, 56  . Healthy Son     Social History   Socioeconomic History  . Marital status: Married    Spouse name: Not on file  . Number of children: 2  . Years of education: Not on file  . Highest education level: Not on file  Occupational History  . Occupation: None  Tobacco Use  . Smoking status: Former Smoker    Types: Cigarettes    Quit date: 08/26/2008    Years since quitting: 12.0  . Smokeless tobacco: Never Used  Vaping Use  . Vaping Use: Never used  Substance and Sexual Activity  . Alcohol use: No    Alcohol/week: 0.0 standard drinks    Comment: hx of   . Drug use: Yes    Comment: hx of years ago - marijuana   . Sexual activity: Not on file  Other Topics Concern  . Not on file  Social History Narrative   She was last working in March 2016 and laid off.  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   Highest level of education:  High school   She lives alone.     Social Determinants of Health   Financial Resource Strain: Not on file  Food Insecurity: Not on file  Transportation Needs: Not on file  Physical Activity: Not on file  Stress: Not on file  Social Connections: Not on file  Intimate Partner Violence: Not on file    Past Medical History, Surgical history, Social history, and Family history were reviewed and updated as appropriate.   Please see review of systems for further details on the patient's review from today.   Objective:   Physical Exam:  There were no vitals taken for this visit.  Physical Exam Exam conducted with a chaperone present.  Constitutional:      General: She is not in acute distress.    Appearance: She is well-developed.  Musculoskeletal:        General: No deformity.  Neurological:     Mental Status: She is alert and oriented to person, place, and time.     Cranial Nerves: No dysarthria.     Coordination: Coordination normal.  Psychiatric:        Attention and Perception: Attention and perception normal. She does not perceive auditory or visual hallucinations.        Mood and Affect: Mood is anxious. Mood is not depressed. Affect is not labile, blunt, angry or inappropriate.        Speech: Speech normal.        Behavior: Behavior normal. Behavior is cooperative.        Thought Content: Thought content normal. Thought content is not paranoid or delusional. Thought content does not include homicidal or suicidal ideation. Thought content does not include homicidal or suicidal plan.        Cognition and Memory: Cognition and memory normal.        Judgment: Judgment  normal.     Comments: Chronic low grade irritability but not a problem. A little down.     Lab Review:     Component Value Date/Time   NA 138 07/06/2020 1116   K 4.0 07/06/2020 1116   CL 106 07/06/2020 1116   CO2 23 07/06/2020  1116   GLUCOSE 106 (H) 07/06/2020 1116   BUN 18 07/06/2020 1116   CREATININE 1.22 (H) 07/06/2020 1116   CREATININE 1.05 12/15/2014 1424   CALCIUM 9.3 07/06/2020 1116   PROT 6.1 12/15/2014 1424   ALBUMIN 4.0 12/15/2014 1424   AST 13 12/15/2014 1424   ALT 10 12/15/2014 1424   ALKPHOS 72 12/15/2014 1424   BILITOT 0.6 12/15/2014 1424   GFRNONAA 50 (L) 07/06/2020 1116   GFRNONAA 59 (L) 12/15/2014 1424   GFRAA >60 02/04/2018 0531   GFRAA 68 12/15/2014 1424       Component Value Date/Time   WBC 4.9 07/06/2020 1116   RBC 3.89 07/06/2020 1116   HGB 12.3 07/06/2020 1116   HCT 38.3 07/06/2020 1116   PLT 222 07/06/2020 1116   MCV 98.5 07/06/2020 1116   MCH 31.6 07/06/2020 1116   MCHC 32.1 07/06/2020 1116   RDW 12.8 07/06/2020 1116   LYMPHSABS 1.6 07/06/2020 1116   MONOABS 0.3 07/06/2020 1116   EOSABS 0.1 07/06/2020 1116   BASOSABS 0.0 07/06/2020 1116    No results found for: POCLITH, LITHIUM   No results found for: PHENYTOIN, PHENOBARB, VALPROATE, CBMZ   .res Assessment: Plan:    Bipolar II disorder (HCC) - Plan: lamoTRIgine (LAMICTAL) 100 MG tablet  Generalized anxiety disorder - Plan: topiramate (TOPAMAX) 50 MG tablet, ALPRAZolam (XANAX) 0.5 MG tablet  Panic disorder with agoraphobia and moderate panic attacks - Plan: topiramate (TOPAMAX) 50 MG tablet  Restless leg syndrome, controlled - Plan: rOPINIRole (REQUIP) 0.5 MG tablet  Insomnia due to mental condition - Plan: traZODone (DESYREL) 100 MG tablet   Patient has a long history of bipolar disorder and has failed multiple medications as noted above.  She is fairly stable with regard to her mood.  She has chronic residual anxiety and social avoidance stomach agoraphobia but not actively having panic attacks.  Overall doing pretty well.  She complains of a poor appetite but has not lost significant weight.  We discussed that this could be a side effect of topiramate.  She is going to try reducing the morning dose to 50 mg  in the morning and keep 100 mg in the evening and see if that improves her appetite during the day.  She is aware that this could increase her anxiety level and if it does let us know.  Let us know if it causes any mood instability.  Disc the importance of protein to maintain health mentally and otherwise.  We discussed the short-term risks associated with benzodiazepines including sedation and increased fall risk among others.  Discussed long-term side effect risk including dependence, potential withdrawal symptoms, and the potential eventual dose-related risk of dementia.  But recent studies from 2020 dispute this association between benzodiazepines and dementia risk. Newer studies in 2020 do not support an association with dementia.  Extensive discussion of this in response to her questions. Disc need for very slow taper for clonazepam. Extensive discussion.  Option trazodone to aid taper.  Restless legs is managed fairly well most of the time with ropinirole  Disc possibility of counseling.  No med change indicated today  Follow-up 6 months  Meredith Staggers MD, DFAPA  Please see After Visit Summary for patient specific instructions.  Future Appointments  Date Time Provider Department Center  09/19/2020 11:30 AM Lanier Prude, MD [facility]    No orders of the defined types were placed in this encounter.     -------------------------------

## 2020-09-18 DIAGNOSIS — G894 Chronic pain syndrome: Secondary | ICD-10-CM | POA: Diagnosis not present

## 2020-09-19 ENCOUNTER — Ambulatory Visit: Payer: Medicare HMO | Admitting: Cardiology

## 2020-09-20 DIAGNOSIS — M21611 Bunion of right foot: Secondary | ICD-10-CM | POA: Diagnosis not present

## 2020-09-20 DIAGNOSIS — M722 Plantar fascial fibromatosis: Secondary | ICD-10-CM | POA: Diagnosis not present

## 2020-09-20 DIAGNOSIS — M205X1 Other deformities of toe(s) (acquired), right foot: Secondary | ICD-10-CM | POA: Diagnosis not present

## 2020-09-27 DIAGNOSIS — M7062 Trochanteric bursitis, left hip: Secondary | ICD-10-CM | POA: Diagnosis not present

## 2020-09-27 DIAGNOSIS — Z96642 Presence of left artificial hip joint: Secondary | ICD-10-CM | POA: Diagnosis not present

## 2020-09-27 DIAGNOSIS — Z96641 Presence of right artificial hip joint: Secondary | ICD-10-CM | POA: Diagnosis not present

## 2020-09-28 ENCOUNTER — Other Ambulatory Visit: Payer: Self-pay | Admitting: Psychiatry

## 2020-09-28 DIAGNOSIS — F3181 Bipolar II disorder: Secondary | ICD-10-CM

## 2020-10-05 ENCOUNTER — Telehealth (HOSPITAL_COMMUNITY): Payer: Self-pay

## 2020-10-05 NOTE — Telephone Encounter (Signed)
Spoke with the patient, detailed instructions given. She stated that she understood and would be here for her test. Asked to call back with any questions. S. EMTP 

## 2020-10-10 ENCOUNTER — Ambulatory Visit (HOSPITAL_COMMUNITY): Payer: Medicare HMO | Attending: Cardiovascular Disease

## 2020-10-10 ENCOUNTER — Ambulatory Visit (HOSPITAL_BASED_OUTPATIENT_CLINIC_OR_DEPARTMENT_OTHER): Payer: Medicare HMO

## 2020-10-10 ENCOUNTER — Other Ambulatory Visit: Payer: Self-pay

## 2020-10-10 DIAGNOSIS — I10 Essential (primary) hypertension: Secondary | ICD-10-CM | POA: Diagnosis not present

## 2020-10-10 DIAGNOSIS — R0789 Other chest pain: Secondary | ICD-10-CM

## 2020-10-10 DIAGNOSIS — R072 Precordial pain: Secondary | ICD-10-CM

## 2020-10-10 LAB — MYOCARDIAL PERFUSION IMAGING
LV dias vol: 129 mL (ref 46–106)
LV sys vol: 23 mL
Peak HR: 83 {beats}/min
Rest HR: 62 {beats}/min
SDS: 1
SRS: 0
SSS: 1
TID: 0.89

## 2020-10-10 LAB — ECHOCARDIOGRAM COMPLETE
Area-P 1/2: 4.21 cm2
Height: 63 in
S' Lateral: 2.5 cm
Weight: 2960 oz

## 2020-10-10 MED ORDER — TECHNETIUM TC 99M TETROFOSMIN IV KIT
29.8000 | PACK | Freq: Once | INTRAVENOUS | Status: AC | PRN
  Administered 2020-10-10: 29.8 via INTRAVENOUS
  Filled 2020-10-10: qty 30

## 2020-10-10 MED ORDER — TECHNETIUM TC 99M TETROFOSMIN IV KIT
10.5000 | PACK | Freq: Once | INTRAVENOUS | Status: AC | PRN
  Administered 2020-10-10: 10.5 via INTRAVENOUS
  Filled 2020-10-10: qty 11

## 2020-10-10 MED ORDER — REGADENOSON 0.4 MG/5ML IV SOLN
0.4000 mg | Freq: Once | INTRAVENOUS | Status: AC
  Administered 2020-10-10: 0.4 mg via INTRAVENOUS

## 2020-10-15 ENCOUNTER — Other Ambulatory Visit: Payer: Self-pay | Admitting: Psychiatry

## 2020-10-15 DIAGNOSIS — F5105 Insomnia due to other mental disorder: Secondary | ICD-10-CM

## 2020-10-19 DIAGNOSIS — Z6834 Body mass index (BMI) 34.0-34.9, adult: Secondary | ICD-10-CM | POA: Diagnosis not present

## 2020-10-19 DIAGNOSIS — E6609 Other obesity due to excess calories: Secondary | ICD-10-CM | POA: Diagnosis not present

## 2020-10-19 DIAGNOSIS — J340 Abscess, furuncle and carbuncle of nose: Secondary | ICD-10-CM | POA: Diagnosis not present

## 2020-10-23 DIAGNOSIS — J4520 Mild intermittent asthma, uncomplicated: Secondary | ICD-10-CM | POA: Diagnosis not present

## 2020-10-23 DIAGNOSIS — I10 Essential (primary) hypertension: Secondary | ICD-10-CM | POA: Diagnosis not present

## 2020-10-23 DIAGNOSIS — K219 Gastro-esophageal reflux disease without esophagitis: Secondary | ICD-10-CM | POA: Diagnosis not present

## 2020-10-24 ENCOUNTER — Other Ambulatory Visit: Payer: Self-pay

## 2020-10-24 ENCOUNTER — Telehealth (INDEPENDENT_AMBULATORY_CARE_PROVIDER_SITE_OTHER): Payer: Medicare HMO | Admitting: Cardiology

## 2020-10-24 VITALS — Ht 63.0 in | Wt 185.0 lb

## 2020-10-24 DIAGNOSIS — R0789 Other chest pain: Secondary | ICD-10-CM | POA: Diagnosis not present

## 2020-10-24 NOTE — Patient Instructions (Signed)
Medication Instructions:  Your physician recommends that you continue on your current medications as directed. Please refer to the Current Medication list given to you today.  Labwork: None ordered.  Testing/Procedures: None ordered.  Follow-Up: Your physician wants you to follow-up in: as needed with Dr. Lambert.    Any Other Special Instructions Will Be Listed Below (If Applicable).  If you need a refill on your cardiac medications before your next appointment, please call your pharmacy.   

## 2020-10-24 NOTE — Progress Notes (Signed)
Virtual Visit via Telephone Note   This visit type was conducted due to national recommendations for restrictions regarding the COVID-19 Pandemic (e.g. social distancing) in an effort to limit this patient's exposure and mitigate transmission in our community.  Due to her co-morbid illnesses, this patient is at least at moderate risk for complications without adequate follow up.  This format is felt to be most appropriate for this patient at this time.  The patient did not have access to video technology/had technical difficulties with video requiring transitioning to audio format only (telephone).  All issues noted in this document were discussed and addressed.  No physical exam could be performed with this format.  Please refer to the patient's chart for her  consent to telehealth for [facility].    Date:  10/24/2020   ID:  Cheryl Hoover, DOB 1956/07/11, MRN [id number] The patient was identified using 2 identifiers.  Patient Location: Home Provider Location: Office/Clinic   PCP:  Cheryl Hoover   [facility]  Cardiologist:  No primary care provider on file.  Advanced Practice Provider:  No care team member to display Electrophysiologist:  Cheryl Prude, MD  Evaluation Performed:  Follow-Up Visit  Chief Complaint:  Atypical chest pain  History of Present Illness:    Cheryl Hoover presents for follow up after I last saw her 07/24/2020 for atypical chest pain. Since our last visit she had an echocardiogram and a SPECT (results below). She has been doing well since our last appointment without recurrent chest pain symptoms.  The patient does not have symptoms concerning for COVID-19 infection (fever, chills, cough, or new shortness of breath).    Past Medical History:  Diagnosis Date  . Allergy   . Anemia   . Anxiety   . Arthritis   . Asthma   . Bipolar disorder (HCC)   . Complication of anesthesia    woke up during surgery   .  Depression   . GERD (gastroesophageal reflux disease)   . Hypertension   . Scoliosis    Past Surgical History:  Procedure Laterality Date  . c-sections  22  . CARPAL TUNNEL RELEASE    . CESAREAN SECTION  1982  . corrective eye surgery    . HERNIA REPAIR  04/2012   hiatial hernia  . lap nissen w/paraesophageal hernia repari  05-21-2010  . TONSILECTOMY, ADENOIDECTOMY, BILATERAL MYRINGOTOMY AND TUBES     patient denies tubes in ears   . TONSILLECTOMY    . TOTAL HIP ARTHROPLASTY Right 06/03/2017   Procedure: RIGHT TOTAL HIP ARTHROPLASTY ANTERIOR APPROACH;  Surgeon: Durene Romans, MD;  Location: [facility];  Service: Orthopedics;  Laterality: Right;  70 mins  . TOTAL HIP ARTHROPLASTY Left 02/03/2018   Procedure: LEFT TOTAL HIP ARTHROPLASTY ANTERIOR APPROACH;  Surgeon: Durene Romans, MD;  Location: [facility];  Service: Orthopedics;  Laterality: Left;  70 mins     Current Meds  Medication Sig  . acetaminophen (TYLENOL) 500 MG tablet Take 2 tablets (1,000 mg total) by mouth every 8 (eight) hours.  Marland Kitchen albuterol (VENTOLIN HFA) 108 (90 BASE) MCG/ACT inhaler Inhale 2 puffs into the lungs every 4 (four) hours as needed for wheezing. (Patient taking differently: Inhale 2 puffs into the lungs every 4 (four) hours as needed for wheezing or shortness of breath.)  . ALPRAZolam (XANAX) 0.5 MG tablet Take 1 tablet (0.5 mg total) by mouth daily as needed for anxiety or sleep.  Marland Kitchen bismuth subsalicylate (PEPTO  BISMOL) 262 MG/15ML suspension Take 30 mLs by mouth 2 (two) times daily as needed for indigestion or diarrhea or loose stools.   Marland Kitchen esomeprazole (NEXIUM) 20 MG capsule Take 20 mg by mouth at bedtime.   Marland Kitchen HYDROcodone-acetaminophen (NORCO) 10-325 MG tablet TAKE 1 TABLET BY MOUTH 4 TIMES DAILY AS NEEDED  . lamoTRIgine (LAMICTAL) 100 MG tablet Take 1 tablet by mouth twice daily  . lisinopril (PRINIVIL,ZESTRIL) 20 MG tablet Take 20 mg by mouth daily.  Marland Kitchen loratadine (CLARITIN) 10 MG tablet Take 10 mg by mouth at  bedtime.   . magnesium 30 MG tablet Take 30 mg by mouth 2 (two) times daily.  . meloxicam (MOBIC) 15 MG tablet Take 15 mg by mouth daily.  . mometasone-formoterol (DULERA) 100-5 MCG/ACT AERO Inhale 2 puffs into the lungs 2 (two) times daily.  . montelukast (SINGULAIR) 10 MG tablet Take 1 tablet by mouth daily. (Patient taking differently: Take 10 mg by mouth at bedtime.)  . Multiple Vitamins-Minerals (MULTI COMPLETE PO) Take by mouth.  Marland Kitchen rOPINIRole (REQUIP) 0.5 MG tablet TAKE 1 & 1/2 (ONE & ONE-HALF) TABLETS BY MOUTH DAILY AT 5 PM THEN 1 & 1/2 (ONE & ONE-HALF)  TABLET AT BEDTIME  . topiramate (TOPAMAX) 50 MG tablet 1 in the morning and 2 in the evening  . traZODone (DESYREL) 100 MG tablet Take 1 tablet (100 mg total) by mouth at bedtime.  . traZODone (DESYREL) 50 MG tablet TAKE 1 TO 2 TABLETS BY MOUTH NIGHTLY AS NEEDED FOR SLEEP  . Vitamins A & D (VITAMIN A & D) ointment Apply 1 application topically as needed for dry skin.     Allergies:   Avocado and Penicillins   Social History   Tobacco Use  . Smoking status: Former Smoker    Types: Cigarettes    Quit date: 08/26/2008    Years since quitting: 12.1  . Smokeless tobacco: Never Used  Vaping Use  . Vaping Use: Never used  Substance Use Topics  . Alcohol use: No    Alcohol/week: 0.0 standard drinks    Comment: hx of   . Drug use: Yes    Comment: hx of years ago - marijuana      Family Hx: The patient's family history includes Anxiety disorder in her brother, father, mother, and son; Diabetes in her son; Healthy in her son; Hypertension in her mother; Parkinson's disease in her father.  ROS:   Please see the history of present illness.     All other systems reviewed and are negative.   Prior CV studies:   The following studies were reviewed today:  10/10/2020 Echo personally reviewed LVEF 60% RV normal MV/AV normal  10/10/2020 SPECT  The left ventricular ejection fraction is normal (55-65%).  Nuclear stress EF:  63%.  There was no ST segment deviation noted during stress.  No T wave inversion was noted during stress.  The study is normal.  This is a low risk study.    Labs/Other Tests and Data Reviewed:    EKG:  No ECG reviewed.  Recent Labs: 07/06/2020: BUN 18; Creatinine, Ser 1.22; Hemoglobin 12.3; Platelets 222; Potassium 4.0; Sodium 138   Recent Lipid Panel Lab Results  Component Value Date/Time   CHOL 165 12/15/2014 02:24 PM   TRIG 246 (H) 12/15/2014 02:24 PM   HDL 39 (L) 12/15/2014 02:24 PM   CHOLHDL 4.2 12/15/2014 02:24 PM   LDLCALC 77 12/15/2014 02:24 PM    Wt Readings from Last 3 Encounters:  10/24/20  185 lb (83.9 kg)  10/10/20 185 lb (83.9 kg)  07/24/20 185 lb (83.9 kg)     Risk Assessment/Calculations:      Objective:    Vital Signs:  Ht 5\' 3"  (1.6 m)   Wt 185 lb (83.9 kg)   BMI 32.77 kg/m    VITAL SIGNS:  reviewed PSYCH:  normal affect  ASSESSMENT & PLAN:    1. Atypical chest pain Patient without significant chest pain since our last visit. Echo and SPECT are without evidence of obstructive CAD. Recommended close follow up with her primary care provider, , PA-C. No indication at this time for further cardiac testing.       Time:   Today, I have spent 20 minutes with the patient with telehealth technology discussing the above problems.     Medication Adjustments/Labs and Tests Ordered: Current medicines are reviewed at length with the patient today.  Concerns regarding medicines are outlined above.   Tests Ordered: No orders of the defined types were placed in this encounter.   Medication Changes: No orders of the defined types were placed in this encounter.   Follow Up:  prn   Signed, Terie Purser, MD  10/24/2020 7:31 PM    [facility]

## 2020-11-01 DIAGNOSIS — M7741 Metatarsalgia, right foot: Secondary | ICD-10-CM | POA: Insufficient documentation

## 2020-11-01 DIAGNOSIS — M21611 Bunion of right foot: Secondary | ICD-10-CM | POA: Diagnosis not present

## 2020-11-01 DIAGNOSIS — M2021 Hallux rigidus, right foot: Secondary | ICD-10-CM | POA: Diagnosis not present

## 2020-11-01 DIAGNOSIS — M2041 Other hammer toe(s) (acquired), right foot: Secondary | ICD-10-CM | POA: Diagnosis not present

## 2020-11-01 DIAGNOSIS — M21619 Bunion of unspecified foot: Secondary | ICD-10-CM | POA: Insufficient documentation

## 2020-11-02 ENCOUNTER — Other Ambulatory Visit (HOSPITAL_COMMUNITY): Payer: Self-pay | Admitting: Orthopedic Surgery

## 2020-11-03 DIAGNOSIS — M7061 Trochanteric bursitis, right hip: Secondary | ICD-10-CM | POA: Diagnosis not present

## 2020-11-03 DIAGNOSIS — M7062 Trochanteric bursitis, left hip: Secondary | ICD-10-CM | POA: Diagnosis not present

## 2020-11-08 ENCOUNTER — Ambulatory Visit (INDEPENDENT_AMBULATORY_CARE_PROVIDER_SITE_OTHER): Payer: Medicare HMO | Admitting: Psychologist

## 2020-11-08 DIAGNOSIS — F411 Generalized anxiety disorder: Secondary | ICD-10-CM

## 2020-11-08 DIAGNOSIS — F330 Major depressive disorder, recurrent, mild: Secondary | ICD-10-CM | POA: Diagnosis not present

## 2020-11-08 DIAGNOSIS — R69 Illness, unspecified: Secondary | ICD-10-CM | POA: Diagnosis not present

## 2020-11-09 ENCOUNTER — Encounter (HOSPITAL_BASED_OUTPATIENT_CLINIC_OR_DEPARTMENT_OTHER): Payer: Self-pay | Admitting: Orthopedic Surgery

## 2020-11-09 ENCOUNTER — Other Ambulatory Visit: Payer: Self-pay

## 2020-11-13 ENCOUNTER — Other Ambulatory Visit (HOSPITAL_COMMUNITY): Payer: Medicare HMO

## 2020-11-22 DIAGNOSIS — J4520 Mild intermittent asthma, uncomplicated: Secondary | ICD-10-CM | POA: Diagnosis not present

## 2020-11-22 DIAGNOSIS — K219 Gastro-esophageal reflux disease without esophagitis: Secondary | ICD-10-CM | POA: Diagnosis not present

## 2020-11-22 DIAGNOSIS — I10 Essential (primary) hypertension: Secondary | ICD-10-CM | POA: Diagnosis not present

## 2020-11-27 ENCOUNTER — Other Ambulatory Visit: Payer: Self-pay

## 2020-11-27 ENCOUNTER — Telehealth: Payer: Self-pay | Admitting: Psychiatry

## 2020-11-27 DIAGNOSIS — F3181 Bipolar II disorder: Secondary | ICD-10-CM

## 2020-11-27 DIAGNOSIS — G2581 Restless legs syndrome: Secondary | ICD-10-CM

## 2020-11-27 DIAGNOSIS — F5105 Insomnia due to other mental disorder: Secondary | ICD-10-CM

## 2020-11-27 DIAGNOSIS — F4001 Agoraphobia with panic disorder: Secondary | ICD-10-CM

## 2020-11-27 DIAGNOSIS — F411 Generalized anxiety disorder: Secondary | ICD-10-CM

## 2020-11-27 MED ORDER — ROPINIROLE HCL 0.5 MG PO TABS: ORAL_TABLET | ORAL | 1 refills | Status: DC

## 2020-11-27 MED ORDER — LAMOTRIGINE 100 MG PO TABS: 100.0000 mg | ORAL_TABLET | Freq: Two times a day (BID) | ORAL | 1 refills | Status: DC

## 2020-11-27 MED ORDER — TOPIRAMATE 50 MG PO TABS: ORAL_TABLET | ORAL | 1 refills | Status: DC

## 2020-11-27 MED ORDER — TRAZODONE HCL 50 MG PO TABS: ORAL_TABLET | ORAL | 1 refills | Status: DC

## 2020-11-27 NOTE — Telephone Encounter (Signed)
Yes please.  OK 90 days with 1 REfill

## 2020-11-27 NOTE — Telephone Encounter (Signed)
Chronic Care Team @ [facility] in [place] called to report that Cheryl Hoover is changing pharmacies to [facility] in [place], [phone].  They request that [name]'s medications be sent to the new pharmacy.  Lamitcal, ropinirole, topiramate, trazodone.

## 2020-11-27 NOTE — Telephone Encounter (Signed)
Please review.Should I send those medications?

## 2020-11-27 NOTE — Telephone Encounter (Signed)
Rx sent 

## 2020-11-30 ENCOUNTER — Ambulatory Visit: Payer: Medicare HMO | Admitting: Psychologist

## 2020-11-30 ENCOUNTER — Encounter (HOSPITAL_BASED_OUTPATIENT_CLINIC_OR_DEPARTMENT_OTHER): Payer: Self-pay | Admitting: Orthopedic Surgery

## 2020-11-30 ENCOUNTER — Other Ambulatory Visit: Payer: Self-pay

## 2020-12-04 ENCOUNTER — Other Ambulatory Visit (HOSPITAL_COMMUNITY): Payer: Medicare HMO

## 2020-12-05 ENCOUNTER — Other Ambulatory Visit (HOSPITAL_COMMUNITY)
Admission: RE | Admit: 2020-12-05 | Discharge: 2020-12-05 | Disposition: A | Discharge status: Home / Self Care | Payer: Medicare HMO | Source: Ambulatory Visit | Attending: Orthopedic Surgery | Admitting: Orthopedic Surgery

## 2020-12-05 DIAGNOSIS — H5200 Hypermetropia, unspecified eye: Secondary | ICD-10-CM | POA: Diagnosis not present

## 2020-12-05 DIAGNOSIS — Z01812 Encounter for preprocedural laboratory examination: Secondary | ICD-10-CM | POA: Insufficient documentation

## 2020-12-05 DIAGNOSIS — Z20822 Contact with and (suspected) exposure to covid-19: Secondary | ICD-10-CM | POA: Diagnosis not present

## 2020-12-05 LAB — SARS CORONAVIRUS 2 (TAT 6-24 HRS): SARS Coronavirus 2: NEGATIVE

## 2020-12-07 ENCOUNTER — Ambulatory Visit (HOSPITAL_BASED_OUTPATIENT_CLINIC_OR_DEPARTMENT_OTHER)
Admission: RE | Admit: 2020-12-07 | Discharge: 2020-12-07 | Disposition: A | Discharge status: Home / Self Care | Payer: Medicare HMO | Attending: Orthopedic Surgery | Admitting: Orthopedic Surgery

## 2020-12-07 ENCOUNTER — Ambulatory Visit (HOSPITAL_BASED_OUTPATIENT_CLINIC_OR_DEPARTMENT_OTHER): Payer: Medicare HMO | Admitting: Certified Registered"

## 2020-12-07 ENCOUNTER — Encounter (HOSPITAL_BASED_OUTPATIENT_CLINIC_OR_DEPARTMENT_OTHER)
Admission: RE | Disposition: A | Discharge status: Home / Self Care | Payer: Self-pay | Source: Home / Self Care | Attending: Orthopedic Surgery

## 2020-12-07 ENCOUNTER — Other Ambulatory Visit: Payer: Self-pay

## 2020-12-07 ENCOUNTER — Encounter (HOSPITAL_BASED_OUTPATIENT_CLINIC_OR_DEPARTMENT_OTHER): Payer: Self-pay | Admitting: Orthopedic Surgery

## 2020-12-07 DIAGNOSIS — M2041 Other hammer toe(s) (acquired), right foot: Secondary | ICD-10-CM

## 2020-12-07 DIAGNOSIS — M205X1 Other deformities of toe(s) (acquired), right foot: Secondary | ICD-10-CM | POA: Diagnosis not present

## 2020-12-07 DIAGNOSIS — M7741 Metatarsalgia, right foot: Secondary | ICD-10-CM | POA: Insufficient documentation

## 2020-12-07 DIAGNOSIS — Z791 Long term (current) use of non-steroidal anti-inflammatories (NSAID): Secondary | ICD-10-CM | POA: Diagnosis not present

## 2020-12-07 DIAGNOSIS — Z79899 Other long term (current) drug therapy: Secondary | ICD-10-CM | POA: Diagnosis not present

## 2020-12-07 DIAGNOSIS — X58XXXA Exposure to other specified factors, initial encounter: Secondary | ICD-10-CM | POA: Diagnosis not present

## 2020-12-07 DIAGNOSIS — M21611 Bunion of right foot: Secondary | ICD-10-CM | POA: Diagnosis not present

## 2020-12-07 DIAGNOSIS — M2021 Hallux rigidus, right foot: Secondary | ICD-10-CM | POA: Diagnosis not present

## 2020-12-07 DIAGNOSIS — Z7951 Long term (current) use of inhaled steroids: Secondary | ICD-10-CM | POA: Diagnosis not present

## 2020-12-07 DIAGNOSIS — Z87891 Personal history of nicotine dependence: Secondary | ICD-10-CM | POA: Insufficient documentation

## 2020-12-07 DIAGNOSIS — S93124A Dislocation of metatarsophalangeal joint of right lesser toe(s), initial encounter: Secondary | ICD-10-CM | POA: Diagnosis not present

## 2020-12-07 DIAGNOSIS — Z96643 Presence of artificial hip joint, bilateral: Secondary | ICD-10-CM | POA: Insufficient documentation

## 2020-12-07 DIAGNOSIS — G8918 Other acute postprocedural pain: Secondary | ICD-10-CM | POA: Diagnosis not present

## 2020-12-07 HISTORY — PX: ARTHRODESIS METATARSALPHALANGEAL JOINT (MTPJ): SHX6566

## 2020-12-07 HISTORY — PX: HAMMERTOE RECONSTRUCTION WITH WEIL OSTEOTOMY: SHX5631

## 2020-12-07 SURGERY — FUSION, JOINT, GREAT TOE
Anesthesia: Regional | Site: Toe | Laterality: Right

## 2020-12-07 MED ORDER — 0.9 % SODIUM CHLORIDE (POUR BTL) OPTIME
TOPICAL | Status: DC | PRN
  Administered 2020-12-07: 120 mL

## 2020-12-07 MED ORDER — FENTANYL CITRATE (PF) 100 MCG/2ML IJ SOLN
50.0000 ug | Freq: Once | INTRAMUSCULAR | Status: AC
  Administered 2020-12-07: 50 ug via INTRAVENOUS

## 2020-12-07 MED ORDER — SODIUM CHLORIDE 0.9 % IV SOLN: INTRAVENOUS | Status: DC

## 2020-12-07 MED ORDER — FENTANYL CITRATE (PF) 100 MCG/2ML IJ SOLN: 25.0000 ug | INTRAMUSCULAR | Status: DC | PRN

## 2020-12-07 MED ORDER — ROPIVACAINE HCL 5 MG/ML IJ SOLN
INTRAMUSCULAR | Status: DC | PRN
  Administered 2020-12-07: 30 mL via PERINEURAL
  Administered 2020-12-07: 10 mL via PERINEURAL

## 2020-12-07 MED ORDER — CEFAZOLIN SODIUM-DEXTROSE 2-4 GM/100ML-% IV SOLN
2.0000 g | INTRAVENOUS | Status: AC
  Administered 2020-12-07: 2 g via INTRAVENOUS

## 2020-12-07 MED ORDER — VANCOMYCIN HCL 500 MG IV SOLR
INTRAVENOUS | Status: DC | PRN
  Administered 2020-12-07: 500 mg via TOPICAL

## 2020-12-07 MED ORDER — OXYCODONE HCL 5 MG PO TABS: 5.0000 mg | ORAL_TABLET | ORAL | 0 refills | Status: AC | PRN

## 2020-12-07 MED ORDER — PROPOFOL 500 MG/50ML IV EMUL
INTRAVENOUS | Status: DC | PRN
  Administered 2020-12-07: 25 ug/kg/min via INTRAVENOUS

## 2020-12-07 MED ORDER — CEFAZOLIN SODIUM-DEXTROSE 2-4 GM/100ML-% IV SOLN
INTRAVENOUS | Status: AC
  Filled 2020-12-07: qty 100

## 2020-12-07 MED ORDER — PHENYLEPHRINE HCL (PRESSORS) 10 MG/ML IV SOLN
INTRAVENOUS | Status: DC | PRN
  Administered 2020-12-07: 80 ug via INTRAVENOUS
  Administered 2020-12-07: 40 ug via INTRAVENOUS
  Administered 2020-12-07: 80 ug via INTRAVENOUS

## 2020-12-07 MED ORDER — DOCUSATE SODIUM 100 MG PO CAPS: 100.0000 mg | ORAL_CAPSULE | Freq: Two times a day (BID) | ORAL | 0 refills | Status: AC

## 2020-12-07 MED ORDER — MIDAZOLAM HCL 2 MG/2ML IJ SOLN
INTRAMUSCULAR | Status: AC
  Filled 2020-12-07: qty 2

## 2020-12-07 MED ORDER — LIDOCAINE 2% (20 MG/ML) 5 ML SYRINGE
INTRAMUSCULAR | Status: DC | PRN
  Administered 2020-12-07: 10 mg via INTRAVENOUS

## 2020-12-07 MED ORDER — LACTATED RINGERS IV SOLN: INTRAVENOUS | Status: DC

## 2020-12-07 MED ORDER — PROPOFOL 10 MG/ML IV BOLUS
INTRAVENOUS | Status: DC | PRN
  Administered 2020-12-07: 200 mg via INTRAVENOUS

## 2020-12-07 MED ORDER — SENNA 8.6 MG PO TABS: 2.0000 | ORAL_TABLET | Freq: Two times a day (BID) | ORAL | 0 refills | Status: DC

## 2020-12-07 MED ORDER — ONDANSETRON HCL 4 MG/2ML IJ SOLN
INTRAMUSCULAR | Status: DC | PRN
  Administered 2020-12-07: 4 mg via INTRAVENOUS

## 2020-12-07 MED ORDER — DEXAMETHASONE SODIUM PHOSPHATE 10 MG/ML IJ SOLN
INTRAMUSCULAR | Status: DC | PRN
  Administered 2020-12-07 (×2): 5 mg

## 2020-12-07 MED ORDER — VANCOMYCIN HCL 500 MG IV SOLR
INTRAVENOUS | Status: AC
  Filled 2020-12-07: qty 500

## 2020-12-07 MED ORDER — ACETAMINOPHEN 500 MG PO TABS
500.0000 mg | ORAL_TABLET | Freq: Once | ORAL | Status: AC
  Administered 2020-12-07: 500 mg via ORAL

## 2020-12-07 MED ORDER — FENTANYL CITRATE (PF) 100 MCG/2ML IJ SOLN
INTRAMUSCULAR | Status: AC
  Filled 2020-12-07: qty 2

## 2020-12-07 MED ORDER — ACETAMINOPHEN 500 MG PO TABS
ORAL_TABLET | ORAL | Status: AC
  Filled 2020-12-07: qty 2

## 2020-12-07 MED ORDER — ACETAMINOPHEN 500 MG PO TABS: 1000.0000 mg | ORAL_TABLET | Freq: Once | ORAL | Status: DC

## 2020-12-07 MED ORDER — MIDAZOLAM HCL 2 MG/2ML IJ SOLN
2.0000 mg | Freq: Once | INTRAMUSCULAR | Status: AC
  Administered 2020-12-07: 2 mg via INTRAVENOUS

## 2020-12-07 SURGICAL SUPPLY — 91 items
APL PRP STRL LF DISP 70% ISPRP (MISCELLANEOUS) ×2
BANDAGE ESMARK 6X9 LF (GAUZE/BANDAGES/DRESSINGS) IMPLANT
BIT DRILL CAL 2.5 ST W/SLV (BIT) ×1 IMPLANT
BLADE AVERAGE 25X9 (BLADE) IMPLANT
BLADE LONG MED 25X9 (BLADE) ×3 IMPLANT
BLADE MICRO SAGITTAL (BLADE) IMPLANT
BLADE OSC/SAG .038X5.5 CUT EDG (BLADE) IMPLANT
BLADE SURG 15 STRL LF DISP TIS (BLADE) ×4 IMPLANT
BLADE SURG 15 STRL SS (BLADE) ×6
BNDG CMPR 9X4 STRL LF SNTH (GAUZE/BANDAGES/DRESSINGS)
BNDG CMPR 9X6 STRL LF SNTH (GAUZE/BANDAGES/DRESSINGS)
BNDG COHESIVE 4X5 TAN STRL (GAUZE/BANDAGES/DRESSINGS) ×2 IMPLANT
BNDG COHESIVE 6X5 TAN STRL LF (GAUZE/BANDAGES/DRESSINGS) IMPLANT
BNDG CONFORM 2 STRL LF (GAUZE/BANDAGES/DRESSINGS) IMPLANT
BNDG CONFORM 3 STRL LF (GAUZE/BANDAGES/DRESSINGS) ×3 IMPLANT
BNDG ELASTIC 4X5.8 VLCR STR LF (GAUZE/BANDAGES/DRESSINGS) ×3 IMPLANT
BNDG ESMARK 4X9 LF (GAUZE/BANDAGES/DRESSINGS) IMPLANT
BNDG ESMARK 6X9 LF (GAUZE/BANDAGES/DRESSINGS)
BOOT STEPPER DURA LG (SOFTGOODS) IMPLANT
BOOT STEPPER DURA MED (SOFTGOODS) ×1 IMPLANT
BOOT STEPPER DURA SM (SOFTGOODS) IMPLANT
BOOT STEPPER DURA XLG (SOFTGOODS) IMPLANT
CAP PIN PROTECTOR ORTHO WHT (CAP) IMPLANT
CHLORAPREP W/TINT 26 (MISCELLANEOUS) ×3 IMPLANT
COVER BACK TABLE 60X90IN (DRAPES) ×3 IMPLANT
COVER WAND RF STERILE (DRAPES) IMPLANT
CUFF TOURN SGL QUICK 34 (TOURNIQUET CUFF) ×3
CUFF TRNQT CYL 34X4.125X (TOURNIQUET CUFF) IMPLANT
DRAPE EXTREMITY T 121X128X90 (DISPOSABLE) ×3 IMPLANT
DRAPE OEC MINIVIEW 54X84 (DRAPES) ×3 IMPLANT
DRAPE U-SHAPE 47X51 STRL (DRAPES) ×3 IMPLANT
DRSG MEPITEL 4X7.2 (GAUZE/BANDAGES/DRESSINGS) ×3 IMPLANT
DRSG PAD ABDOMINAL 8X10 ST (GAUZE/BANDAGES/DRESSINGS) ×3 IMPLANT
ELECT REM PT RETURN 9FT ADLT (ELECTROSURGICAL) ×3
ELECTRODE REM PT RTRN 9FT ADLT (ELECTROSURGICAL) ×2 IMPLANT
GAUZE SPONGE 4X4 12PLY STRL (GAUZE/BANDAGES/DRESSINGS) ×3 IMPLANT
GLOVE SRG 8 PF TXTR STRL LF DI (GLOVE) ×4 IMPLANT
GLOVE SURG ENC MOIS LTX SZ8 (GLOVE) ×3 IMPLANT
GLOVE SURG LTX SZ8 (GLOVE) ×3 IMPLANT
GLOVE SURG POLYISO LF SZ7 (GLOVE) ×2 IMPLANT
GLOVE SURG UNDER POLY LF SZ7 (GLOVE) ×1 IMPLANT
GLOVE SURG UNDER POLY LF SZ8 (GLOVE) ×6
GOWN STRL REUS W/ TWL LRG LVL3 (GOWN DISPOSABLE) ×2 IMPLANT
GOWN STRL REUS W/ TWL XL LVL3 (GOWN DISPOSABLE) ×4 IMPLANT
GOWN STRL REUS W/TWL LRG LVL3 (GOWN DISPOSABLE) ×3
GOWN STRL REUS W/TWL XL LVL3 (GOWN DISPOSABLE) ×6
K-WIRE .054X4 (WIRE) ×2 IMPLANT
K-WIRE ACE 1.6X6 (WIRE) ×6
KIT INSTRUMENT MPJ STRATUM (KITS) ×1 IMPLANT
KWIRE ACE 1.6X6 (WIRE) IMPLANT
NEEDLE HYPO 22GX1.5 SAFETY (NEEDLE) IMPLANT
NS IRRIG 1000ML POUR BTL (IV SOLUTION) ×3 IMPLANT
PACK BASIN DAY SURGERY FS (CUSTOM PROCEDURE TRAY) ×3 IMPLANT
PAD CAST 4YDX4 CTTN HI CHSV (CAST SUPPLIES) ×2 IMPLANT
PADDING CAST ABS 4INX4YD NS (CAST SUPPLIES)
PADDING CAST ABS COTTON 4X4 ST (CAST SUPPLIES) IMPLANT
PADDING CAST COTTON 4X4 STRL (CAST SUPPLIES) ×3
PADDING CAST COTTON 6X4 STRL (CAST SUPPLIES) IMPLANT
PASSER SUT SWANSON 36MM LOOP (INSTRUMENTS) IMPLANT
PENCIL SMOKE EVACUATOR (MISCELLANEOUS) ×3 IMPLANT
PLATE FUSION STD RT 1MPJ (Plate) IMPLANT
PLATE MPJ 1ST STRM SM 0D RT (Plate) ×1 IMPLANT
SANITIZER HAND PURELL 535ML FO (MISCELLANEOUS) ×3 IMPLANT
SCREW HCS TWIST-OFF 2.0X12MM (Screw) ×2 IMPLANT
SCREW LOCK STRATUM 3.5X16 (Screw) ×1 IMPLANT
SCREW LOCK STRATUM 3.5X20 (Screw) ×2 IMPLANT
SCREW NL LP STRATUM 3.5X16 (Screw) ×1 IMPLANT
SCREW NLOCK LP ST STRM 3.5X14 (Screw) ×1 IMPLANT
SCREW STRM NL LP 3.5X20 (Screw) ×1 IMPLANT
SHEET MEDIUM DRAPE 40X70 STRL (DRAPES) ×3 IMPLANT
SLEEVE SCD COMPRESS KNEE MED (STOCKING) ×3 IMPLANT
SPLINT FAST PLASTER 5X30 (CAST SUPPLIES)
SPLINT PLASTER CAST FAST 5X30 (CAST SUPPLIES) IMPLANT
SPONGE LAP 18X18 RF (DISPOSABLE) ×3 IMPLANT
SPONGE SURGIFOAM ABS GEL 12-7 (HEMOSTASIS) IMPLANT
STOCKINETTE 6  STRL (DRAPES) ×3
STOCKINETTE 6 STRL (DRAPES) ×2 IMPLANT
SUCTION FRAZIER HANDLE 10FR (MISCELLANEOUS) ×3
SUCTION TUBE FRAZIER 10FR DISP (MISCELLANEOUS) ×2 IMPLANT
SUT ETHILON 3 0 PS 1 (SUTURE) ×4 IMPLANT
SUT MNCRL AB 3-0 PS2 18 (SUTURE) ×3 IMPLANT
SUT VIC AB 0 SH 27 (SUTURE) IMPLANT
SUT VIC AB 2-0 SH 27 (SUTURE) ×3
SUT VIC AB 2-0 SH 27XBRD (SUTURE) ×2 IMPLANT
SUT VICRYL 0 UR6 27IN ABS (SUTURE) IMPLANT
SYR BULB EAR ULCER 3OZ GRN STR (SYRINGE) ×3 IMPLANT
SYR CONTROL 10ML LL (SYRINGE) IMPLANT
TOWEL GREEN STERILE FF (TOWEL DISPOSABLE) ×6 IMPLANT
TUBE CONNECTING 20X1/4 (TUBING) ×3 IMPLANT
UNDERPAD 30X36 HEAVY ABSORB (UNDERPADS AND DIAPERS) ×3 IMPLANT
YANKAUER SUCT BULB TIP NO VENT (SUCTIONS) IMPLANT

## 2020-12-07 NOTE — Anesthesia Procedure Notes (Signed)
Anesthesia Regional Block: Popliteal block   Pre-Anesthetic Checklist: ,, timeout performed, Correct Patient, Correct Site, Correct Laterality, Correct Procedure, Correct Position, site marked, Risks and benefits discussed,  Surgical consent,  Pre-op evaluation,  At surgeon's request and post-op pain management  Laterality: Right  Prep: Maximum Sterile Barrier Precautions used, chloraprep       Needles:  Injection technique: Single-shot  Needle Type: Echogenic Stimulator Needle     Needle Length: 9cm  Needle Gauge: 22     Additional Needles:   Procedures:,,,, ultrasound used (permanent image in chart),,,,  Narrative:  Start time: 12/07/2020 7:15 AM End time: 12/07/2020 7:22 AM Injection made incrementally with aspirations every 5 mL.  Performed by: Personally  Anesthesiologist: Elmer Picker, MD  Additional Notes: Monitors applied. No increased pain on injection. No increased resistance to injection. Injection made in 5cc increments. Good needle visualization. Patient tolerated procedure well.

## 2020-12-07 NOTE — Progress Notes (Signed)
Assisted Dr. Woodrum with right, ultrasound guided, popliteal/saphenous block. Side rails up, monitors on throughout procedure. See vital signs in flow sheet. Tolerated Procedure well. 

## 2020-12-07 NOTE — Anesthesia Postprocedure Evaluation (Signed)
Anesthesia Post Note  Patient: Cheryl Hoover  Procedure(s) Performed: Right hallux metatarsophalangeal joint arthrodesis and silver bunionectomy (Right Foot) 2-3 weil and hammertoe corrections (Right Toe)     Patient location during evaluation: PACU Anesthesia Type: Regional and General Level of consciousness: awake and alert Pain management: pain level controlled Vital Signs Assessment: post-procedure vital signs reviewed and stable Respiratory status: spontaneous breathing, nonlabored ventilation, respiratory function stable and patient connected to nasal cannula oxygen Cardiovascular status: blood pressure returned to baseline and stable Postop Assessment: no apparent nausea or vomiting Anesthetic complications: no   No complications documented.  Last Vitals:  Vitals:   12/07/20 0927 12/07/20 0945  BP:  131/75  Pulse: 75 77  Resp: 14 16  Temp:  (!) 36.2 C  SpO2: 95% 100%    Last Pain:  Vitals:   12/07/20 0945  TempSrc:   PainSc: 0-No pain                  L 

## 2020-12-07 NOTE — Transfer of Care (Signed)
Immediate Anesthesia Transfer of Care Note  Patient: Cheryl Hoover  Procedure(s) Performed: Right hallux metatarsophalangeal joint arthrodesis and silver bunionectomy (Right Foot) 2-3 weil and hammertoe corrections (Right Toe)  Patient Location: PACU  Anesthesia Type:GA combined with regional for post-op pain  Level of Consciousness: awake, alert , oriented and patient cooperative  Airway & Oxygen Therapy: Patient Spontanous Breathing and Patient connected to face mask oxygen  Post-op Assessment: Report given to RN and Post -op Vital signs reviewed and stable  Post vital signs: Reviewed and stable  Last Vitals:  Vitals Value Taken Time  BP 140/80 12/07/20 0902  Temp    Pulse 80 12/07/20 0903  Resp 12 12/07/20 0903  SpO2 100 % 12/07/20 0903  Vitals shown include unvalidated device data.  Last Pain:  Vitals:   12/07/20 0633  TempSrc: Oral  PainSc: 0-No pain      Patients Stated Pain Goal: 2 (12/07/20 2774)  Complications: No complications documented.

## 2020-12-07 NOTE — Anesthesia Preprocedure Evaluation (Addendum)
Anesthesia Evaluation  Patient identified by MRN, date of birth, ID band Patient awake    Reviewed: Allergy & Precautions, NPO status , Patient's Chart, lab work & pertinent test results  Airway Mallampati: II  TM Distance: >3 FB Neck ROM: Full    Dental  (+) Edentulous Lower, Lower Dentures, Dental Advisory Given   Pulmonary asthma , former smoker,    Pulmonary exam normal breath sounds clear to auscultation       Cardiovascular hypertension, Pt. on medications Normal cardiovascular exam Rhythm:Regular Rate:Normal  TTE 2022 1. Left ventricular ejection fraction, by estimation, is 60 to 65%. The  left ventricle has normal function. The left ventricle has no regional  wall motion abnormalities. Left ventricular diastolic parameters were  normal.  2. Right ventricular systolic function is normal. The right ventricular  size is normal. There is normal pulmonary artery systolic pressure.  3. The mitral valve is normal in structure. Trivial mitral valve  regurgitation.  4. The aortic valve is normal in structure. Aortic valve regurgitation is  not visualized.  5. Aortic dilatation noted. There is borderline dilatation of the  ascending aorta, measuring 38 mm.   Stress Test 2022  negative   Neuro/Psych PSYCHIATRIC DISORDERS Anxiety Depression Bipolar Disorder negative neurological ROS     GI/Hepatic Neg liver ROS, hiatal hernia, GERD  Controlled and Medicated,  Endo/Other  negative endocrine ROS  Renal/GU negative Renal ROS  negative genitourinary   Musculoskeletal  (+) Arthritis ,   Abdominal   Peds  Hematology negative hematology ROS (+)   Anesthesia Other Findings   Reproductive/Obstetrics                            Anesthesia Physical Anesthesia Plan  ASA: II  Anesthesia Plan: General and Regional   Post-op Pain Management:  Regional for Post-op pain   Induction:  Intravenous  PONV Risk Score and Plan: 3 and Ondansetron, Dexamethasone and Midazolam  Airway Management Planned: LMA  Additional Equipment:   Intra-op Plan:   Post-operative Plan: Extubation in OR  Informed Consent: I have reviewed the patients History and Physical, chart, labs and discussed the procedure including the risks, benefits and alternatives for the proposed anesthesia with the patient or authorized representative who has indicated his/her understanding and acceptance.     Dental advisory given  Plan Discussed with: CRNA  Anesthesia Plan Comments:         Anesthesia Quick Evaluation

## 2020-12-07 NOTE — Discharge Instructions (Addendum)
Toni Arthurs, MD [facility]  Please read the following information regarding your care after surgery.  Medications  You only need a prescription for the narcotic pain medicine (ex. oxycodone, Percocet, Norco).  All of the other medicines listed below are available over the counter. X Meloxicam that you have at home as prescribed for the first 3 days after surgery. X acetominophen (Tylenol) 650 mg every 4-6 hours as you need for minor to moderate pain X resume your hydrocodone X May supplement with oxycodone as prescribed for severe pain  Narcotic pain medicine (ex. oxycodone, Percocet, Vicodin) will cause constipation.  To prevent this problem, take the following medicines while you are taking any pain medicine. X docusate sodium (Colace) 100 mg twice a day X senna (Senokot) 2 tablets twice a day  Weight Bearing X Bear weight only on your operated foot in the CAM boot.   Cast / Splint / Dressing X Keep your splint, cast or dressing clean and dry.  Don't put anything (coat hanger, pencil, etc) down inside of it.  If it gets damp, use a hair dryer on the cool setting to dry it.  If it gets soaked, call the office to schedule an appointment for a cast change.   After your dressing, cast or splint is removed; you may shower, but do not soak or scrub the wound.  Allow the water to run over it, and then gently pat it dry.  Swelling It is normal for you to have swelling where you had surgery.  To reduce swelling and pain, keep your toes above your nose for at least 3 days after surgery.  It may be necessary to keep your foot or leg elevated for several weeks.  If it hurts, it should be elevated.  Follow Up Call my office at [phone] when you are discharged from the hospital or surgery center to schedule an appointment to be seen two weeks after surgery.  Call my office at [phone] if you develop a fever >101.5 F, nausea, vomiting, bleeding from the surgical site or severe pain.     May take Tylenol after 1pm, if needed.    Post Anesthesia Home Care Instructions  Activity: Get plenty of rest for the remainder of the day. A responsible individual must stay with you for 24 hours following the procedure.  For the next 24 hours, DO NOT: -Drive a car -Advertising copywriter -Drink alcoholic beverages -Take any medication unless instructed by your physician -Make any legal decisions or sign important papers.  Meals: Start with liquid foods such as gelatin or soup. Progress to regular foods as tolerated. Avoid greasy, spicy, heavy foods. If nausea and/or vomiting occur, drink only clear liquids until the nausea and/or vomiting subsides. Call your physician if vomiting continues.  Special Instructions/Symptoms: Your throat may feel dry or sore from the anesthesia or the breathing tube placed in your throat during surgery. If this causes discomfort, gargle with warm salt water. The discomfort should disappear within 24 hours.  If you had a scopolamine patch placed behind your ear for the management of post- operative nausea and/or vomiting:  1. The medication in the patch is effective for 72 hours, after which it should be removed.  Wrap patch in a tissue and discard in the trash. Wash hands thoroughly with soap and water. 2. You may remove the patch earlier than 72 hours if you experience unpleasant side effects which may include dry mouth, dizziness or visual disturbances. 3. Avoid touching the patch. Wash your  hands with soap and water after contact with the patch.    Regional Anesthesia Blocks  1. Numbness or the inability to move the "blocked" extremity may last from 3-48 hours after placement. The length of time depends on the medication injected and your individual response to the medication. If the numbness is not going away after 48 hours, call your surgeon.  2. The extremity that is blocked will need to be protected until the numbness is gone and the  Strength has  returned. Because you cannot feel it, you will need to take extra care to avoid injury. Because it may be weak, you may have difficulty moving it or using it. You may not know what position it is in without looking at it while the block is in effect.  3. For blocks in the legs and feet, returning to weight bearing and walking needs to be done carefully. You will need to wait until the numbness is entirely gone and the strength has returned. You should be able to move your leg and foot normally before you try and bear weight or walk. You will need someone to be with you when you first try to ensure you do not fall and possibly risk injury.  4. Bruising and tenderness at the needle site are common side effects and will resolve in a few days.  5. Persistent numbness or new problems with movement should be communicated to the surgeon or the [facility] [id number] [facility] [phone]).

## 2020-12-07 NOTE — Op Note (Signed)
12/07/2020  9:12 AM  PATIENT:  Cheryl Hoover  65 y.o. female  PRE-OPERATIVE DIAGNOSIS: 1.  Right hallux rigidus 2.  Right foot severe bunion deformity 3.  Right forefoot metatarsalgia 4.  Right second and third hammertoe deformities with crossover  POST-OPERATIVE DIAGNOSIS: 1.  Right hallux rigidus and severe bunion deformity 2.  Right forefoot metatarsalgia 3.  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  Right hallux MP joint arthrodesis 2.  Right foot silver bunionectomy 3.  Right second and third metatarsal Weil osteotomies 4.  Right second and third MTP joint open reduction 5.  Right second and third hammertoe corrections 6.  Right foot AP, oblique and lateral radiographs  SURGEON:  Toni Arthurs, MD  ASSISTANT: Alfredo Martinez, PA-C  ANESTHESIA:   General, regional  EBL:  minimal   TOURNIQUET:   Total Tourniquet Time Documented: Thigh (Right) - 56 minutes Total: Thigh (Right) - 56 minutes  COMPLICATIONS:  None apparent  DISPOSITION:  Extubated, awake and stable to recovery.  INDICATION FOR PROCEDURE: The patient is a 65 year old female without significant past medical history.  She has a long history of right forefoot pain due to a severe bunion deformity.  She also has hallux rigidus and second and third hammertoe deformities with crossover toes.  She has failed nonoperative treatment to date including activity modification, oral anti-inflammatories and shoewear modification.  She presents now for operative treatment of these painful right forefoot deformities.  The risks and benefits of the alternative treatment options have been discussed in detail.  The patient wishes to proceed with surgery and specifically understands risks of bleeding, infection, nerve damage, blood clots, need for additional surgery, amputation and death.  PROCEDURE IN DETAIL:  After pre operative consent was obtained, and the correct operative site  was identified, the patient was brought to the operating room and placed supine on the OR table.  Anesthesia was administered.  Pre-operative antibiotics were administered.  A surgical timeout was taken.  The right foot was prepped and draped in standard sterile fashion with a tourniquet around the thigh.  The extremity was elevated and the tourniquet inflated to 250 mmHg.  A longitudinal incision was made over the hallux MP joint.  Dissection was carried down through the subcutaneous tissues.  The extensor tendons were protected.  Collateral ligaments were released exposing the metatarsal head.  A concave reamer was used to remove the remaining articular cartilage and subchondral bone.  A convex reamer was then used to remove the remaining articular cartilage and subchondral bone from the base of the proximal phalanx.  The wound was irrigated copiously and perforated on both sides with a small drill bit.  The joint was reduced and provisionally pinned.  Radiographs and simulated weightbearing examination showed appropriate position of the toe.  The joint was then fixed with a [employer] stratum plate.  The compression slot was drilled and used to compress the joint appropriately.  The remaining holes were filled with locking and nonlocking screws.  Radiographs confirmed appropriate reduction of the joint and appropriate position and length of all hardware.  The wound was irrigated and sprinkled with vancomycin powder.  Subcutaneous tissues were approximated with Monocryl.  The skin incision was closed with nylon.  Attention was turned to the second webspace where a longitudinal incision was made.  Dissection was carried down through the subcutaneous tissues to the second MTP joint.  The joint was noted to be dislocated with dorsal displacement  of the proximal phalanx relative to the head of the metatarsal.  The extensor tendons were lengthened, and the joint capsule was incised and elevated medially and  laterally.  An elevator was used as a lever to reduce the joint.  The third MTP joint was also noted to be dislocated.  The same procedure was performed reducing the joint appropriately.  The second metatarsal was then shortened with a Weil osteotomy removing a wedge of bone distally.  The osteotomy was fixed with a 12 mm [employer] FRS screw.  The third metatarsal was shortened in the same fashion and also fixed with a 12 mm FRS screw.  Attention was turned to the second toe where a transverse incision was made over the PIP joint.  Dissection was carried down through the extensor mechanism sharply.  The head of the proximal phalanx was resected followed by the base of the middle phalanx.  The joint was then reduced and fixed with a K wire advanced across the IP joints.  The MP joint was then reduced and the K wire driven across into the second metatarsal shaft.  Radiographs confirmed appropriate reduction of the joint and appropriate position of the pin.  Same procedures were then performed for the third toe.  K wires were bent, trimmed and capped.  Final AP, lateral and oblique radiographs confirmed appropriate position and length of all hardware and appropriate correction of the forefoot deformities.  The wounds were irrigated copiously and sprinkled with vancomycin powder.  Extensor tendons were repaired with Monocryl.  The incisions were closed with horizontal mattress sutures of 3-0 nylon.  Sterile dressings were applied followed by a short leg cam boot.  The tourniquet had been released prior to closure of the wounds.  The toes were appropriately perfused.  Hemostasis was achieved prior to closure.  Patient was then awakened from anesthesia and transported to the recovery room in stable condition.   FOLLOW UP PLAN: Weightbearing as tolerated on the heel in the cam boot.  Follow-up in 2 weeks for suture removal.  Plan to remove the pins 6 weeks postop.   RADIOGRAPHS: AP, lateral and oblique  radiographs of the right foot are obtained intraoperatively.  These show interval correction of MTP joint dislocations at the second and third rays as well as hammertoe corrections and shortening of the second and third metatarsals.  Also noted is arthrodesis of the first MTP joint.  Hardware is appropriately positioned and of the appropriate lengths.  No other acute injuries are noted.    Alfredo Martinez PA-C was present and scrubbed for the duration of the operative case. His assistance was essential in positioning the patient, prepping and draping, gaining and maintaining exposure, performing the operation, closing and dressing the wounds and applying the splint.

## 2020-12-07 NOTE — Anesthesia Procedure Notes (Signed)
Anesthesia Regional Block: Adductor canal block   Pre-Anesthetic Checklist: ,, timeout performed, Correct Patient, Correct Site, Correct Laterality, Correct Procedure, Correct Position, site marked, Risks and benefits discussed,  Surgical consent,  Pre-op evaluation,  At surgeon's request and post-op pain management  Laterality: Right  Prep: Maximum Sterile Barrier Precautions used, chloraprep       Needles:  Injection technique: Single-shot  Needle Type: Echogenic Stimulator Needle     Needle Length: 9cm  Needle Gauge: 22     Additional Needles:   Procedures:,,,, ultrasound used (permanent image in chart),,,,  Narrative:  Start time: 12/07/2020 7:22 AM End time: 12/07/2020 7:26 AM Injection made incrementally with aspirations every 5 mL.  Performed by: Personally  Anesthesiologist: Elmer Picker, MD  Additional Notes: Monitors applied. No increased pain on injection. No increased resistance to injection. Injection made in 5cc increments. Good needle visualization. Patient tolerated procedure well.

## 2020-12-07 NOTE — Anesthesia Procedure Notes (Signed)
Procedure Name: LMA Insertion Date/Time: 12/07/2020 7:36 AM Performed by: Sheryn Bison, CRNA Pre-anesthesia Checklist: Patient identified, Emergency Drugs available, Suction available and Patient being monitored Patient Re-evaluated:Patient Re-evaluated prior to induction Oxygen Delivery Method: Circle System Utilized Preoxygenation: Pre-oxygenation with 100% oxygen Induction Type: IV induction Ventilation: Mask ventilation without difficulty LMA: LMA inserted LMA Size: 4.0 Number of attempts: 1 Airway Equipment and Method: bite block Placement Confirmation: positive ETCO2 Tube secured with: Tape Dental Injury: Teeth and Oropharynx as per pre-operative assessment

## 2020-12-07 NOTE — H&P (Signed)
Cheryl Hoover is an 65 y.o. female.   Chief Complaint:  Right foot pain HPI:  65 y/o female without significant PMH c/o R foot pain for many years.  She has hallux rigidus and a severe bunion deformity with severe 2nd and 3rd hammertoe deformities.  She has failed non op treatment to date and presents today for surgical treatment.  Past Medical History:  Diagnosis Date  . Allergy   . Anemia   . Anxiety   . Arthritis   . Asthma   . Bipolar disorder (HCC)   . Complication of anesthesia    woke up during surgery   . Depression   . GERD (gastroesophageal reflux disease)   . Hypertension   . Scoliosis     Past Surgical History:  Procedure Laterality Date  . c-sections  62  . CARPAL TUNNEL RELEASE    . CESAREAN SECTION  1982  . corrective eye surgery    . HERNIA REPAIR  04/2012   hiatial hernia  . lap nissen w/paraesophageal hernia repari  05-21-2010  . TONSILECTOMY, ADENOIDECTOMY, BILATERAL MYRINGOTOMY AND TUBES     patient denies tubes in ears   . TONSILLECTOMY    . TOTAL HIP ARTHROPLASTY Right 06/03/2017   Procedure: RIGHT TOTAL HIP ARTHROPLASTY ANTERIOR APPROACH;  Surgeon: Durene Romans, MD;  Location: [facility];  Service: Orthopedics;  Laterality: Right;  70 mins  . TOTAL HIP ARTHROPLASTY Left 02/03/2018   Procedure: LEFT TOTAL HIP ARTHROPLASTY ANTERIOR APPROACH;  Surgeon: Durene Romans, MD;  Location: [facility];  Service: Orthopedics;  Laterality: Left;  70 mins    Family History  Problem Relation Age of Onset  . Hypertension Mother        Deceased  . Anxiety disorder Mother   . Parkinson's disease Father        Deceased  . Anxiety disorder Father   . Anxiety disorder Brother   . Anxiety disorder Son   . Diabetes Son        Deceased, 39  . Healthy Son    Social History:  reports that she quit smoking about 12 years ago. Her smoking use included cigarettes. She has never used smokeless tobacco. She reports current drug use. She reports that she does not drink  alcohol.  Allergies:  Allergies  Allergen Reactions  . Avocado Itching  . Penicillins Other (See Comments)    Unknown,childhood allergy  Has patient had a PCN reaction causing immediate rash, facial/tongue/throat swelling, SOB or lightheadedness with hypotension: Unknown Has patient had a PCN reaction causing severe rash involving mucus membranes or skin necrosis: Unknown Has patient had a PCN reaction that required hospitalization: Unknown Has patient had a PCN reaction occurring within the last 10 years: No If all of the above answers are "NO", then may proceed with Cephalosporin use.     Medications Prior to Admission  Medication Sig Dispense Refill  . acetaminophen (TYLENOL) 500 MG tablet Take 2 tablets (1,000 mg total) by mouth every 8 (eight) hours. 30 tablet 0  . albuterol (VENTOLIN HFA) 108 (90 BASE) MCG/ACT inhaler Inhale 2 puffs into the lungs every 4 (four) hours as needed for wheezing. (Patient taking differently: Inhale 2 puffs into the lungs every 4 (four) hours as needed for wheezing or shortness of breath.) 18 g 6  . ALPRAZolam (XANAX) 0.5 MG tablet Take 1 tablet (0.5 mg total) by mouth daily as needed for anxiety or sleep. 60 tablet 0  . HYDROcodone-acetaminophen (NORCO) 10-325 MG tablet TAKE 1  TABLET BY MOUTH 4 TIMES DAILY AS NEEDED    . lamoTRIgine (LAMICTAL) 100 MG tablet Take 1 tablet (100 mg total) by mouth 2 (two) times daily. 180 tablet 1  . lisinopril (PRINIVIL,ZESTRIL) 20 MG tablet Take 20 mg by mouth daily.    Marland Kitchen loratadine (CLARITIN) 10 MG tablet Take 10 mg by mouth at bedtime.     . magnesium 30 MG tablet Take 30 mg by mouth 2 (two) times daily.    . meloxicam (MOBIC) 15 MG tablet Take 15 mg by mouth daily.    . mometasone-formoterol (DULERA) 100-5 MCG/ACT AERO Inhale 2 puffs into the lungs 2 (two) times daily.    . montelukast (SINGULAIR) 10 MG tablet Take 1 tablet by mouth daily. (Patient taking differently: Take 10 mg by mouth at bedtime.) 90 tablet 1  .  Multiple Vitamins-Minerals (MULTI COMPLETE PO) Take by mouth.    . pantoprazole (PROTONIX) 40 MG tablet Take 40 mg by mouth daily.    Marland Kitchen rOPINIRole (REQUIP) 0.5 MG tablet TAKE 1 & 1/2 (ONE & ONE-HALF) TABLETS BY MOUTH DAILY AT 5 PM THEN 1 & 1/2 (ONE & ONE-HALF)  TABLET AT BEDTIME 180 tablet 1  . topiramate (TOPAMAX) 50 MG tablet 1 in the morning and 2 in the evening 270 tablet 1  . traZODone (DESYREL) 50 MG tablet Take 1-2 tablets by mouth nightly as needed for sleep. 180 tablet 1  . Vitamins A & D (VITAMIN A & D) ointment Apply 1 application topically as needed for dry skin.      Results for orders placed or performed during the hospital encounter of 12/05/20 (from the past 48 hour(s))  SARS CORONAVIRUS 2 (TAT 6-24 HRS) Nasopharyngeal Nasopharyngeal Swab     Status: None   Collection Time: 12/05/20 10:40 AM   Specimen: Nasopharyngeal Swab  Result Value Ref Range   SARS Coronavirus 2 NEGATIVE NEGATIVE    Comment: (NOTE) SARS-CoV-2 target nucleic acids are NOT DETECTED.  The SARS-CoV-2 RNA is generally detectable in upper and lower respiratory specimens during the acute phase of infection. Negative results do not preclude SARS-CoV-2 infection, do not rule out co-infections with other pathogens, and should not be used as the sole basis for treatment or other patient management decisions. Negative results must be combined with clinical observations, patient history, and epidemiological information. The expected result is Negative.  Fact Sheet for Patients: HairSlick.no  Fact Sheet for Healthcare Providers: quierodirigir.com  This test is not yet approved or cleared by the [place] FDA and  has been authorized for detection and/or diagnosis of SARS-CoV-2 by FDA under an Emergency Use Authorization (EUA). This EUA will remain  in effect (meaning this test can be used) for the duration of the COVID-19 declaration under Se ction  564(b)(1) of the Act, 21 U.S.C. section 360bbb-3(b)(1), unless the authorization is terminated or revoked sooner.  Performed at [facility], [address], [place], [place] [address]    No results found.  Review of Systems  No recent f/c/n/v/wt loss  Blood pressure 126/77, pulse 85, temperature (!) 97.2 F (36.2 C), temperature source Oral, resp. rate 17, height 5\' 3"  (1.6 m), weight 88.5 kg, SpO2 99 %. Physical Exam  wn wd woman in nad.  A and O x 4.  Normal mood and affect.  EOMI.  Resp unlabored.  R forefoot with bunion and severe 2nd and 3rd hammertoes.  Skin is o/w healthy.  Pulses are palpable in the foot.  No lymphadenopathy.  Active PF and DF strength at the toes.  Assessment/Plan R bunion, hallux rigidus, 2nd and 3rd hammertoes and metatarsalgia.  To OR today for surgical correction.  The risks and benefits of the alternative treatment options have been discussed in detail.  The patient wishes to proceed with surgery and specifically understands risks of bleeding, infection, nerve damage, blood clots, need for additional surgery, amputation and death.   Toni Arthurs, MD 12/18/2020, 7:16 AM

## 2020-12-11 NOTE — Progress Notes (Signed)
Patient states her toe is "black", describes the skin as darkened and states she cannot feel it when she touches it. She has already called Dr. Laverta Baltimore office this morning and is waiting to hear back from them. She said they told her they would reach back out by noon today.

## 2020-12-12 ENCOUNTER — Encounter (HOSPITAL_BASED_OUTPATIENT_CLINIC_OR_DEPARTMENT_OTHER): Payer: Self-pay | Admitting: Orthopedic Surgery

## 2020-12-20 DIAGNOSIS — Z0100 Encounter for examination of eyes and vision without abnormal findings: Secondary | ICD-10-CM | POA: Diagnosis not present

## 2020-12-23 DIAGNOSIS — I10 Essential (primary) hypertension: Secondary | ICD-10-CM | POA: Diagnosis not present

## 2020-12-23 DIAGNOSIS — K219 Gastro-esophageal reflux disease without esophagitis: Secondary | ICD-10-CM | POA: Diagnosis not present

## 2021-01-03 DIAGNOSIS — M2021 Hallux rigidus, right foot: Secondary | ICD-10-CM | POA: Diagnosis not present

## 2021-01-03 DIAGNOSIS — Z4889 Encounter for other specified surgical aftercare: Secondary | ICD-10-CM | POA: Diagnosis not present

## 2021-01-08 DIAGNOSIS — L03115 Cellulitis of right lower limb: Secondary | ICD-10-CM | POA: Diagnosis not present

## 2021-01-08 DIAGNOSIS — M79671 Pain in right foot: Secondary | ICD-10-CM | POA: Diagnosis not present

## 2021-01-08 DIAGNOSIS — Z4789 Encounter for other orthopedic aftercare: Secondary | ICD-10-CM | POA: Diagnosis not present

## 2021-01-23 DIAGNOSIS — I10 Essential (primary) hypertension: Secondary | ICD-10-CM | POA: Diagnosis not present

## 2021-01-23 DIAGNOSIS — K219 Gastro-esophageal reflux disease without esophagitis: Secondary | ICD-10-CM | POA: Diagnosis not present

## 2021-02-14 DIAGNOSIS — G894 Chronic pain syndrome: Secondary | ICD-10-CM | POA: Diagnosis not present

## 2021-02-16 ENCOUNTER — Telehealth: Payer: Self-pay | Admitting: Psychiatry

## 2021-02-16 ENCOUNTER — Other Ambulatory Visit: Payer: Self-pay

## 2021-02-16 DIAGNOSIS — G2581 Restless legs syndrome: Secondary | ICD-10-CM

## 2021-02-16 DIAGNOSIS — Z4889 Encounter for other specified surgical aftercare: Secondary | ICD-10-CM | POA: Diagnosis not present

## 2021-02-16 DIAGNOSIS — M2021 Hallux rigidus, right foot: Secondary | ICD-10-CM | POA: Diagnosis not present

## 2021-02-16 MED ORDER — ROPINIROLE HCL 0.5 MG PO TABS: ORAL_TABLET | ORAL | 0 refills | Status: DC

## 2021-02-16 NOTE — Telephone Encounter (Signed)
[facility] called regarding Cheryl Hoover's Ropinirole. They are waiting for her mail order prescription to come in but patient needs a short fill to last until delivery. Their phone number is [phone].

## 2021-02-16 NOTE — Telephone Encounter (Signed)
2 week supply submitted per request

## 2021-02-16 NOTE — Telephone Encounter (Signed)
review 

## 2021-02-16 NOTE — Telephone Encounter (Signed)
I don't see a message was this a error?

## 2021-02-19 ENCOUNTER — Other Ambulatory Visit: Payer: Self-pay

## 2021-02-19 DIAGNOSIS — G2581 Restless legs syndrome: Secondary | ICD-10-CM

## 2021-02-19 NOTE — Telephone Encounter (Signed)
Pharmacy called and left a message that they asked for a 90 day supply of the ropinrole instead of two weeks. Please cancel and [facility]

## 2021-02-20 ENCOUNTER — Other Ambulatory Visit: Payer: Self-pay

## 2021-02-20 ENCOUNTER — Other Ambulatory Visit: Payer: Self-pay | Admitting: Family Medicine

## 2021-02-20 DIAGNOSIS — Z139 Encounter for screening, unspecified: Secondary | ICD-10-CM

## 2021-02-20 DIAGNOSIS — G2581 Restless legs syndrome: Secondary | ICD-10-CM

## 2021-02-20 MED ORDER — ROPINIROLE HCL 0.5 MG PO TABS: ORAL_TABLET | ORAL | 0 refills | Status: DC

## 2021-02-22 DIAGNOSIS — K219 Gastro-esophageal reflux disease without esophagitis: Secondary | ICD-10-CM | POA: Diagnosis not present

## 2021-02-22 DIAGNOSIS — I10 Essential (primary) hypertension: Secondary | ICD-10-CM | POA: Diagnosis not present

## 2021-03-06 ENCOUNTER — Other Ambulatory Visit: Payer: Self-pay

## 2021-03-06 ENCOUNTER — Ambulatory Visit
Admission: RE | Admit: 2021-03-06 | Discharge: 2021-03-06 | Disposition: A | Discharge status: Home / Self Care | Payer: Medicare HMO | Source: Ambulatory Visit | Attending: Family Medicine | Admitting: Family Medicine

## 2021-03-06 DIAGNOSIS — Z139 Encounter for screening, unspecified: Secondary | ICD-10-CM

## 2021-03-06 DIAGNOSIS — Z1231 Encounter for screening mammogram for malignant neoplasm of breast: Secondary | ICD-10-CM | POA: Diagnosis not present

## 2021-03-07 DIAGNOSIS — Z008 Encounter for other general examination: Secondary | ICD-10-CM | POA: Diagnosis not present

## 2021-03-07 DIAGNOSIS — M1990 Unspecified osteoarthritis, unspecified site: Secondary | ICD-10-CM | POA: Diagnosis not present

## 2021-03-07 DIAGNOSIS — G8929 Other chronic pain: Secondary | ICD-10-CM | POA: Diagnosis not present

## 2021-03-07 DIAGNOSIS — K219 Gastro-esophageal reflux disease without esophagitis: Secondary | ICD-10-CM | POA: Diagnosis not present

## 2021-03-07 DIAGNOSIS — G4700 Insomnia, unspecified: Secondary | ICD-10-CM | POA: Diagnosis not present

## 2021-03-07 DIAGNOSIS — F411 Generalized anxiety disorder: Secondary | ICD-10-CM | POA: Diagnosis not present

## 2021-03-07 DIAGNOSIS — E669 Obesity, unspecified: Secondary | ICD-10-CM | POA: Diagnosis not present

## 2021-03-07 DIAGNOSIS — I10 Essential (primary) hypertension: Secondary | ICD-10-CM | POA: Diagnosis not present

## 2021-03-07 DIAGNOSIS — R69 Illness, unspecified: Secondary | ICD-10-CM | POA: Diagnosis not present

## 2021-03-07 DIAGNOSIS — J45909 Unspecified asthma, uncomplicated: Secondary | ICD-10-CM | POA: Diagnosis not present

## 2021-03-07 DIAGNOSIS — Z6834 Body mass index (BMI) 34.0-34.9, adult: Secondary | ICD-10-CM | POA: Diagnosis not present

## 2021-03-12 ENCOUNTER — Other Ambulatory Visit: Payer: Self-pay | Admitting: Family Medicine

## 2021-03-12 DIAGNOSIS — R928 Other abnormal and inconclusive findings on diagnostic imaging of breast: Secondary | ICD-10-CM

## 2021-03-14 ENCOUNTER — Other Ambulatory Visit (HOSPITAL_COMMUNITY): Payer: Self-pay | Admitting: Family Medicine

## 2021-03-14 DIAGNOSIS — R928 Other abnormal and inconclusive findings on diagnostic imaging of breast: Secondary | ICD-10-CM

## 2021-03-16 DIAGNOSIS — M2021 Hallux rigidus, right foot: Secondary | ICD-10-CM | POA: Diagnosis not present

## 2021-03-23 ENCOUNTER — Other Ambulatory Visit: Payer: Self-pay | Admitting: Family Medicine

## 2021-03-23 ENCOUNTER — Encounter (HOSPITAL_COMMUNITY): Payer: Self-pay

## 2021-03-23 ENCOUNTER — Ambulatory Visit (HOSPITAL_COMMUNITY)
Admission: RE | Admit: 2021-03-23 | Discharge: 2021-03-23 | Disposition: A | Discharge status: Home / Self Care | Payer: Medicare HMO | Source: Ambulatory Visit | Attending: Family Medicine | Admitting: Family Medicine

## 2021-03-23 ENCOUNTER — Other Ambulatory Visit: Payer: Self-pay

## 2021-03-23 DIAGNOSIS — R921 Mammographic calcification found on diagnostic imaging of breast: Secondary | ICD-10-CM

## 2021-03-23 DIAGNOSIS — R928 Other abnormal and inconclusive findings on diagnostic imaging of breast: Secondary | ICD-10-CM | POA: Diagnosis not present

## 2021-03-23 DIAGNOSIS — R922 Inconclusive mammogram: Secondary | ICD-10-CM | POA: Diagnosis not present

## 2021-03-25 DIAGNOSIS — K219 Gastro-esophageal reflux disease without esophagitis: Secondary | ICD-10-CM | POA: Diagnosis not present

## 2021-03-25 DIAGNOSIS — I10 Essential (primary) hypertension: Secondary | ICD-10-CM | POA: Diagnosis not present

## 2021-04-04 ENCOUNTER — Ambulatory Visit
Admission: RE | Admit: 2021-04-04 | Discharge: 2021-04-04 | Disposition: A | Discharge status: Home / Self Care | Payer: Medicare HMO | Source: Ambulatory Visit | Attending: Family Medicine | Admitting: Family Medicine

## 2021-04-04 ENCOUNTER — Other Ambulatory Visit: Payer: Self-pay | Admitting: Family Medicine

## 2021-04-04 ENCOUNTER — Other Ambulatory Visit: Payer: Self-pay

## 2021-04-04 DIAGNOSIS — N6489 Other specified disorders of breast: Secondary | ICD-10-CM | POA: Diagnosis not present

## 2021-04-04 DIAGNOSIS — R921 Mammographic calcification found on diagnostic imaging of breast: Secondary | ICD-10-CM | POA: Diagnosis not present

## 2021-04-04 HISTORY — PX: BREAST BIOPSY: SHX20

## 2021-04-10 ENCOUNTER — Ambulatory Visit (HOSPITAL_COMMUNITY): Payer: Medicare HMO

## 2021-04-10 ENCOUNTER — Encounter (HOSPITAL_COMMUNITY): Payer: Medicare HMO

## 2021-04-18 DIAGNOSIS — M2021 Hallux rigidus, right foot: Secondary | ICD-10-CM | POA: Diagnosis not present

## 2021-04-23 DIAGNOSIS — Z5181 Encounter for therapeutic drug level monitoring: Secondary | ICD-10-CM | POA: Diagnosis not present

## 2021-04-23 DIAGNOSIS — M5416 Radiculopathy, lumbar region: Secondary | ICD-10-CM | POA: Diagnosis not present

## 2021-04-23 DIAGNOSIS — G894 Chronic pain syndrome: Secondary | ICD-10-CM | POA: Diagnosis not present

## 2021-04-23 DIAGNOSIS — Z79899 Other long term (current) drug therapy: Secondary | ICD-10-CM | POA: Diagnosis not present

## 2021-05-06 ENCOUNTER — Other Ambulatory Visit: Payer: Self-pay | Admitting: Psychiatry

## 2021-05-06 DIAGNOSIS — F411 Generalized anxiety disorder: Secondary | ICD-10-CM

## 2021-05-06 DIAGNOSIS — F4001 Agoraphobia with panic disorder: Secondary | ICD-10-CM

## 2021-05-06 DIAGNOSIS — F3181 Bipolar II disorder: Secondary | ICD-10-CM

## 2021-05-07 NOTE — Telephone Encounter (Signed)
Pt called and said that [facility] packages her medicines. So we need to send in new scripts with very specific directions. Requip is 0.5 mg 1 2 x daily. Lamictal 100 mg 1 am and one at bedtime. Trazodone 2 at bedtime. Please send in new scripts

## 2021-05-08 ENCOUNTER — Other Ambulatory Visit: Payer: Self-pay | Admitting: Psychiatry

## 2021-05-08 DIAGNOSIS — F5105 Insomnia due to other mental disorder: Secondary | ICD-10-CM

## 2021-05-10 ENCOUNTER — Other Ambulatory Visit: Payer: Self-pay

## 2021-05-10 DIAGNOSIS — R42 Dizziness and giddiness: Secondary | ICD-10-CM | POA: Diagnosis not present

## 2021-05-10 DIAGNOSIS — Z6835 Body mass index (BMI) 35.0-35.9, adult: Secondary | ICD-10-CM | POA: Diagnosis not present

## 2021-05-10 DIAGNOSIS — E6609 Other obesity due to excess calories: Secondary | ICD-10-CM | POA: Diagnosis not present

## 2021-05-10 DIAGNOSIS — Z1331 Encounter for screening for depression: Secondary | ICD-10-CM | POA: Diagnosis not present

## 2021-05-10 DIAGNOSIS — Z23 Encounter for immunization: Secondary | ICD-10-CM | POA: Diagnosis not present

## 2021-05-10 DIAGNOSIS — G2581 Restless legs syndrome: Secondary | ICD-10-CM

## 2021-05-10 DIAGNOSIS — I10 Essential (primary) hypertension: Secondary | ICD-10-CM | POA: Diagnosis not present

## 2021-05-10 DIAGNOSIS — K219 Gastro-esophageal reflux disease without esophagitis: Secondary | ICD-10-CM | POA: Diagnosis not present

## 2021-05-10 DIAGNOSIS — J4520 Mild intermittent asthma, uncomplicated: Secondary | ICD-10-CM | POA: Diagnosis not present

## 2021-05-10 MED ORDER — ROPINIROLE HCL 0.5 MG PO TABS: ORAL_TABLET | ORAL | 0 refills | Status: DC

## 2021-05-25 DIAGNOSIS — K219 Gastro-esophageal reflux disease without esophagitis: Secondary | ICD-10-CM | POA: Diagnosis not present

## 2021-05-25 DIAGNOSIS — I10 Essential (primary) hypertension: Secondary | ICD-10-CM | POA: Diagnosis not present

## 2021-05-29 DIAGNOSIS — M5416 Radiculopathy, lumbar region: Secondary | ICD-10-CM | POA: Diagnosis not present

## 2021-06-05 ENCOUNTER — Ambulatory Visit: Payer: Medicare HMO | Admitting: Psychiatry

## 2021-08-16 ENCOUNTER — Other Ambulatory Visit: Payer: Self-pay

## 2021-08-16 ENCOUNTER — Ambulatory Visit: Payer: Medicare HMO | Admitting: Psychiatry

## 2021-08-16 ENCOUNTER — Encounter: Payer: Self-pay | Admitting: Psychiatry

## 2021-08-16 DIAGNOSIS — F5105 Insomnia due to other mental disorder: Secondary | ICD-10-CM | POA: Diagnosis not present

## 2021-08-16 DIAGNOSIS — F4001 Agoraphobia with panic disorder: Secondary | ICD-10-CM | POA: Diagnosis not present

## 2021-08-16 DIAGNOSIS — F3181 Bipolar II disorder: Secondary | ICD-10-CM

## 2021-08-16 DIAGNOSIS — G2581 Restless legs syndrome: Secondary | ICD-10-CM

## 2021-08-16 DIAGNOSIS — F411 Generalized anxiety disorder: Secondary | ICD-10-CM

## 2021-08-16 DIAGNOSIS — R69 Illness, unspecified: Secondary | ICD-10-CM | POA: Diagnosis not present

## 2021-08-16 MED ORDER — LAMOTRIGINE 100 MG PO TABS: ORAL_TABLET | ORAL | 3 refills | Status: DC

## 2021-08-16 MED ORDER — ROPINIROLE HCL 0.5 MG PO TABS: ORAL_TABLET | ORAL | 3 refills | Status: DC

## 2021-08-16 MED ORDER — TRAZODONE HCL 100 MG PO TABS: 100.0000 mg | ORAL_TABLET | Freq: Every day | ORAL | 3 refills | Status: DC

## 2021-08-16 MED ORDER — TOPIRAMATE 50 MG PO TABS: ORAL_TABLET | ORAL | 3 refills | Status: DC

## 2021-08-16 NOTE — Progress Notes (Signed)
Cheryl Hoover [id number] February 27, 1956 65 y.o.   Subjective:   Patient ID:  Cheryl Hoover is a 65 y.o. (DOB 10/30/1955) female.  Chief Complaint:  Chief Complaint  Patient presents with   Follow-up   Depression   Anxiety    HPI Cheryl Hoover presents to the office today for follow-up of bipolar and anxiety.   seen April 2020.  Topiramate was reduced to 50 mg in the morning and 100 mg in the evening.  No other meds were changed.  seen October 2020.  The following was noted: Forgot to reduce the topiramate and has continued 100 mg BID.  12/21/19 appt noted the following without med changes except as noted: Kind of down in the dumps.  Doesn't like being at home so much.  Not sure of the cause.  Things are a lot different for [place] bc so isolated and doing little.  Like to travel and can't now.  But did drive across the country and it was fun but not every day is like that.  Still tend to be short-tempered but Ok overall.  Low appetite.  Likes snacks.  Eats supper. Question if med related. Sedentary.  Doesn't know how to lose weight.  Weight stable Rare Xanax.  Doesn't like to drive but will do it. The only med change today is to decrease topiramate 100 mg tablets  To one half every morning and 1 every afternoon.  She didn't do this last time and appetite is still poor.   December 21, 2019 appointment, the following is noted: Compliant with meds. Wants change in clonazepam.  Tried to reduce to 1 1/2 tablets at night and couldn't sleep.  Currently sleeping well.  Except for bad dreams for 6 mos.  Usually don't awaken her at night.  Spouse woke her recently bc of talking in bad dream.Spke with clinical pharmacist who suggested trying to stop.    No SE.   For awhile appetite was better but still don't eat well.  No desire for food daytime until night and poor choices longterm.  No weight changes.  Little depression.  Not bad. Plan: The only med change today is to decrease topiramate 100  mg tablets  To one half every morning and 1 every afternoon.  She didn't do this last time and appetite is still poor.   09/06/20 appt with following noted: Rarely takes alprazolam. Stopped clonazepam and takes 100 mg trazodone and sleeps well.  Clonazepam hard to stop with WD and reduced later by 1/4 tablet every 2 weeks and took a couple of mos to get used to the change. Plan no changes.  08/16/21 appt noted: Pretty much ok since here.  Only bothered by family issues.   Satisfied with meds.  No SE problem except PM lamotrigine makes her sleepy.   Wants ropinirole written for 0.5 mg 1 at 5 and 1 at bedtime and trazodone written for 100 mg HS. Tried video therapist with young female therapist and didn't work out.  Patient reports stable mood and denies or irritable moods.  Patient reports recent difficulty with anxiety mainly claustrophobia.. Occ bouts of feeling on edge of anxiety, speeded up but not really manic and not severe.    Patient denies difficulty with sleep initiation or maintenance. Poor appetite disturbance chronically without change.  Patient reports that energy and motivation have been chronically poor.  Patient denies any difficulty with concentration.  Patient denies any suicidal ideation.  No panic lately. No mania.  RLS  managed.  Past Psychiatric Medication Trials: Wellbutrin, Pristiq, Nardil, Prozac, Paxil hypomania, Zoloft, imipramine, desipramine, Wellbutrin, Serzone 600 mg, buspirone, clonazepam,  topiramate 100 mg twice daily, lamotrigine 100 mg twice daily, carbamazepine,  Saphris, gabapentin,  Latuda,   ropinirole,    Review of Systems:  Review of Systems  Constitutional:  Negative for appetite change and unexpected weight change.  HENT:  Positive for tinnitus.   Cardiovascular:  Negative for palpitations.  Musculoskeletal:  Positive for arthralgias and gait problem.  Neurological:  Negative for tremors, weakness and headaches.   Medications: I have reviewed the  patient's current medications.  Current Outpatient Medications  Medication Sig Dispense Refill   acetaminophen (TYLENOL) 500 MG tablet Take 2 tablets (1,000 mg total) by mouth every 8 (eight) hours. 30 tablet 0   albuterol (VENTOLIN HFA) 108 (90 BASE) MCG/ACT inhaler Inhale 2 puffs into the lungs every 4 (four) hours as needed for wheezing. (Patient taking differently: Inhale 2 puffs into the lungs every 4 (four) hours as needed for wheezing or shortness of breath.) 18 g 6   ALPRAZolam (XANAX) 0.5 MG tablet Take 1 tablet (0.5 mg total) by mouth daily as needed for anxiety or sleep. 60 tablet 0   docusate sodium (COLACE) 100 MG capsule Take 1 capsule (100 mg total) by mouth 2 (two) times daily. While taking narcotic pain medicine. 30 capsule 0   HYDROcodone-acetaminophen (NORCO) 10-325 MG tablet TAKE 1 TABLET BY MOUTH 4 TIMES DAILY AS NEEDED     lisinopril (PRINIVIL,ZESTRIL) 20 MG tablet Take 20 mg by mouth daily.     loratadine (CLARITIN) 10 MG tablet Take 10 mg by mouth at bedtime.      magnesium 30 MG tablet Take 30 mg by mouth 2 (two) times daily.     meloxicam (MOBIC) 15 MG tablet Take 15 mg by mouth daily.     mometasone-formoterol (DULERA) 100-5 MCG/ACT AERO Inhale 2 puffs into the lungs 2 (two) times daily.     montelukast (SINGULAIR) 10 MG tablet Take 1 tablet by mouth daily. (Patient taking differently: Take 10 mg by mouth at bedtime.) 90 tablet 1   Multiple Vitamins-Minerals (MULTI COMPLETE PO) Take by mouth.     pantoprazole (PROTONIX) 40 MG tablet Take 40 mg by mouth daily.     senna (SENOKOT) 8.6 MG TABS tablet Take 2 tablets (17.2 mg total) by mouth 2 (two) times daily. 30 tablet 0   Vitamins A & D (VITAMIN A & D) ointment Apply 1 application topically as needed for dry skin.     lamoTRIgine (LAMICTAL) 100 MG tablet 1 in the morning and 1 at bedtime 180 tablet 3   rOPINIRole (REQUIP) 0.5 MG tablet TAKE 1 TABLETS BY MOUTH DAILY AT 5 PM THEN 1  TABLET AT BEDTIME 180 tablet 3    topiramate (TOPAMAX) 50 MG tablet TAKE ONE TABLET BY MOUTH EVERY MORNING and TAKE TWO TABLETS at 5 PM 270 tablet 3   traZODone (DESYREL) 100 MG tablet Take 1 tablet (100 mg total) by mouth at bedtime. 90 tablet 3   No current facility-administered medications for this visit.    Medication Side Effects: Appetite Suppression and Other: ?  Allergies:  Allergies  Allergen Reactions   Avocado Itching   Penicillins Other (See Comments)    Unknown,childhood allergy  Has patient had a PCN reaction causing immediate rash, facial/tongue/throat swelling, SOB or lightheadedness with hypotension: Unknown Has patient had a PCN reaction causing severe rash involving mucus membranes or skin necrosis: Unknown  Has patient had a PCN reaction that required hospitalization: Unknown Has patient had a PCN reaction occurring within the last 10 years: No If all of the above answers are "NO", then may proceed with Cephalosporin use.     Past Medical History:  Diagnosis Date   Allergy    Anemia    Anxiety    Arthritis    Asthma    Bipolar disorder (HCC)    Complication of anesthesia    woke up during surgery    Depression    GERD (gastroesophageal reflux disease)    Hypertension    Scoliosis     Family History  Problem Relation Age of Onset   Hypertension Mother        Deceased   Anxiety disorder Mother    Parkinson's disease Father        Deceased   Anxiety disorder Father    Anxiety disorder Brother    Anxiety disorder Son    Diabetes Son        Deceased, 53   Healthy Son     Social History   Socioeconomic History   Marital status: Married    Spouse name: Not on file   Number of children: 2   Years of education: Not on file   Highest education level: Not on file  Occupational History   Occupation: None  Tobacco Use   Smoking status: Former    Types: Cigarettes    Quit date: 08/26/2008    Years since quitting: 12.9   Smokeless tobacco: Never  Vaping Use   Vaping Use: Never  used  Substance and Sexual Activity   Alcohol use: No    Alcohol/week: 0.0 standard drinks    Comment: hx of    Drug use: Yes    Comment: hx of years ago - marijuana    Sexual activity: Not on file  Other Topics Concern   Not on file  Social History Narrative   She was last working in March 2016 and laid off.  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   Highest level of education:  High school   She lives alone.     Social Determinants of Health   Financial Resource Strain: Not on file  Food Insecurity: Not on file  Transportation Needs: Not on file  Physical Activity: Not on file  Stress: Not on file  Social Connections: Not on file  Intimate Partner Violence: Not on file    Past Medical History, Surgical history, Social history, and Family history were reviewed and updated as appropriate.   Please see review of systems for further details on the patient's review from today.   Objective:   Physical Exam:  There were no vitals taken for this visit.  Physical Exam Exam conducted with a chaperone present.  Constitutional:      General: She is not in acute distress.    Appearance: She is well-developed.  Musculoskeletal:        General: No deformity.  Neurological:     Mental Status: She is alert and oriented to person, place, and time.     Cranial Nerves: No dysarthria.     Coordination: Coordination normal.  Psychiatric:        Attention and Perception: Attention and perception normal. She does not perceive auditory or visual hallucinations.        Mood and Affect: Mood is not anxious or depressed. Affect is not labile, blunt, angry  or inappropriate.        Speech: Speech normal.        Behavior: Behavior normal. Behavior is cooperative.        Thought Content: Thought content normal. Thought content is not paranoid or delusional. Thought content does not include homicidal or suicidal ideation. Thought content does not include  suicidal plan.        Cognition and Memory: Cognition and memory normal.        Judgment: Judgment normal.     Comments: Chronic low grade irritability but not a problem.     Lab Review:     Component Value Date/Time   NA 138 07/06/2020 1116   K 4.0 07/06/2020 1116   CL 106 07/06/2020 1116   CO2 23 07/06/2020 1116   GLUCOSE 106 (H) 07/06/2020 1116   BUN 18 07/06/2020 1116   CREATININE 1.22 (H) 07/06/2020 1116   CREATININE 1.05 12/15/2014 1424   CALCIUM 9.3 07/06/2020 1116   PROT 6.1 12/15/2014 1424   ALBUMIN 4.0 12/15/2014 1424   AST 13 12/15/2014 1424   ALT 10 12/15/2014 1424   ALKPHOS 72 12/15/2014 1424   BILITOT 0.6 12/15/2014 1424   GFRNONAA 50 (L) 07/06/2020 1116   GFRNONAA 59 (L) 12/15/2014 1424   GFRAA >60 02/04/2018 0531   GFRAA 68 12/15/2014 1424       Component Value Date/Time   WBC 4.9 07/06/2020 1116   RBC 3.89 07/06/2020 1116   HGB 12.3 07/06/2020 1116   HCT 38.3 07/06/2020 1116   PLT 222 07/06/2020 1116   MCV 98.5 07/06/2020 1116   MCH 31.6 07/06/2020 1116   MCHC 32.1 07/06/2020 1116   RDW 12.8 07/06/2020 1116   LYMPHSABS 1.6 07/06/2020 1116   MONOABS 0.3 07/06/2020 1116   EOSABS 0.1 07/06/2020 1116   BASOSABS 0.0 07/06/2020 1116    No results found for: POCLITH, LITHIUM   No results found for: PHENYTOIN, PHENOBARB, VALPROATE, CBMZ   .res Assessment: Plan:    Insomnia due to mental condition - Plan: traZODone (DESYREL) 100 MG tablet  Restless leg syndrome, controlled - Plan: rOPINIRole (REQUIP) 0.5 MG tablet  Generalized anxiety disorder - Plan: topiramate (TOPAMAX) 50 MG tablet  Panic disorder with agoraphobia and moderate panic attacks - Plan: topiramate (TOPAMAX) 50 MG tablet  Bipolar II disorder (HCC) - Plan: lamoTRIgine (LAMICTAL) 100 MG tablet   Patient has a long history of bipolar disorder and has failed multiple medications as noted above.  She is fairly stable with regard to her mood.  She has chronic residual anxiety and social  avoidance stomach agoraphobia but not actively having panic attacks.  Overall doing pretty well.  She complains of a poor appetite but has not lost significant weight.  We discussed that this could be a side effect of topiramate.  She reduced the morning dose to 50 mg in the morning and keep 100 mg in the evening and see if that improves her appetite during the day.   Let us know if it causes any mood instability.  Disc the importance of protein to maintain health mentally and otherwise.  Rare use of Xanax.  Restless legs is managed fairly well most of the time with reduced ropinirole 0.5 mg q 5 and HS Continue other meds without change.  Disc possibility of counseling.  No other med change indicated today  Follow-up 9-12 months  Meredith Staggers MD, DFAPA  Please see After Visit Summary for patient specific instructions.  No future appointments.  No orders of the defined types were placed in this encounter.     -------------------------------

## 2021-08-20 ENCOUNTER — Ambulatory Visit
Admission: EM | Admit: 2021-08-20 | Discharge: 2021-08-20 | Disposition: A | Discharge status: Home / Self Care | Payer: Medicare HMO | Attending: Family Medicine | Admitting: Family Medicine

## 2021-08-20 ENCOUNTER — Other Ambulatory Visit: Payer: Self-pay

## 2021-08-20 DIAGNOSIS — U071 COVID-19: Secondary | ICD-10-CM | POA: Diagnosis not present

## 2021-08-20 MED ORDER — FLUTICASONE PROPIONATE 50 MCG/ACT NA SUSP: 1.0000 | Freq: Two times a day (BID) | NASAL | 2 refills | Status: DC

## 2021-08-20 MED ORDER — MOLNUPIRAVIR EUA 200MG CAPSULE: 4.0000 | ORAL_CAPSULE | Freq: Two times a day (BID) | ORAL | 0 refills | Status: AC

## 2021-08-20 MED ORDER — PROMETHAZINE-DM 6.25-15 MG/5ML PO SYRP: 5.0000 mL | ORAL_SOLUTION | Freq: Four times a day (QID) | ORAL | 0 refills | Status: DC | PRN

## 2021-08-20 NOTE — ED Triage Notes (Signed)
Patient states she tested positive for Covid with an at home test yesterday.   She states she is having body aches.   She states she has a tendency to get bronchitis and sinus infections at this time of year.  Denies Fever

## 2021-08-21 NOTE — ED Provider Notes (Signed)
[facility]    CSN: [id number] Arrival date & time: 08/20/21  8295      History   Chief Complaint Chief Complaint  Patient presents with   Nasal Congestion    Patient tested positive for Covid with at home test.    HPI Cheryl Hoover is a 65 y.o. female.   Patient presenting today with 1 day history of body aches, cough, nasal congestion, fatigue.  States that she tested positive yesterday for COVID with an at-home test.  She denies fever, chills, chest pain, shortness of breath, abdominal pain, nausea vomiting or diarrhea.  States she has a tendency this time a year to get bronchitis and sinus infections and does have a history of asthma on albuterol as needed.   Past Medical History:  Diagnosis Date   Allergy    Anemia    Anxiety    Arthritis    Asthma    Bipolar disorder (HCC)    Complication of anesthesia    woke up during surgery    Depression    GERD (gastroesophageal reflux disease)    Hypertension    Scoliosis     Patient Active Problem List   Diagnosis Date Noted   Bipolar 2 disorder (HCC) 09/23/2018   Phobia 09/23/2018   Obese 02/04/2018   Overweight (BMI 25.0-29.9) 06/04/2017   S/P right THA, AA 06/03/2017   Lumbosacral radiculopathy 04/26/2015   Degenerative lumbar spinal stenosis 04/26/2015   Sliding hiatal hernia, recurrent 09/16/2011   ANXIETY DEPRESSION 04/21/2009   ASTHMA, MILD 04/21/2009   GERD 04/21/2009   CONSTIPATION 04/21/2009   SCOLIOSIS 04/21/2009   EARLY SATIETY 04/21/2009   ABDOMINAL PAIN, EPIGASTRIC 04/21/2009   ANOREXIA NERVOSA, HX OF 04/21/2009   BULIMIA, HX OF 04/21/2009    Past Surgical History:  Procedure Laterality Date   ARTHRODESIS METATARSALPHALANGEAL JOINT (MTPJ) Right 12/07/2020   Procedure: Right hallux metatarsophalangeal joint arthrodesis and silver bunionectomy;  Surgeon: Toni Arthurs, MD;  Location: [facility];  Service: Orthopedics;  Laterality: Right;   c-sections  1983   CARPAL  TUNNEL RELEASE     CESAREAN SECTION  1982   corrective eye surgery     HAMMERTOE RECONSTRUCTION WITH WEIL OSTEOTOMY Right 12/07/2020   Procedure: 2-3 weil and hammertoe corrections;  Surgeon: Toni Arthurs, MD;  Location: [facility];  Service: Orthopedics;  Laterality: Right;   HERNIA REPAIR  04/2012   hiatial hernia   lap nissen w/paraesophageal hernia repari  05-21-2010   TONSILECTOMY, ADENOIDECTOMY, BILATERAL MYRINGOTOMY AND TUBES     patient denies tubes in ears    TONSILLECTOMY     TOTAL HIP ARTHROPLASTY Right 06/03/2017   Procedure: RIGHT TOTAL HIP ARTHROPLASTY ANTERIOR APPROACH;  Surgeon: Durene Romans, MD;  Location: [facility];  Service: Orthopedics;  Laterality: Right;  70 mins   TOTAL HIP ARTHROPLASTY Left 02/03/2018   Procedure: LEFT TOTAL HIP ARTHROPLASTY ANTERIOR APPROACH;  Surgeon: Durene Romans, MD;  Location: [facility];  Service: Orthopedics;  Laterality: Left;  70 mins    OB History   No obstetric history on file.      Home Medications    Prior to Admission medications   Medication Sig Start Date End Date Taking? Authorizing Provider  fluticasone (FLONASE) 50 MCG/ACT nasal spray Place 1 spray into both nostrils 2 (two) times daily. 08/20/21  Yes Particia Nearing, PA-C  molnupiravir EUA (LAGEVRIO) 200 mg CAPS capsule Take 4 capsules (800 mg total) by mouth 2 (two) times daily for  5 days. 08/20/21 08/25/21 Yes Particia Nearing, PA-C  promethazine-dextromethorphan (PROMETHAZINE-DM) 6.25-15 MG/5ML syrup Take 5 mLs by mouth 4 (four) times daily as needed. 08/20/21  Yes Particia Nearing, PA-C  acetaminophen (TYLENOL) 500 MG tablet Take 2 tablets (1,000 mg total) by mouth every 8 (eight) hours. 02/03/18   Lanney Gins, PA-C  albuterol (VENTOLIN HFA) 108 (90 BASE) MCG/ACT inhaler Inhale 2 puffs into the lungs every 4 (four) hours as needed for wheezing. Patient taking differently: Inhale 2 puffs into the lungs every 4 (four) hours as needed for  wheezing or shortness of breath. 05/16/14   [name], MD  ALPRAZolam Prudy Feeler) 0.5 MG tablet Take 1 tablet (0.5 mg total) by mouth daily as needed for anxiety or sleep. 09/06/20   Cottle, Steva Ready., MD  docusate sodium (COLACE) 100 MG capsule Take 1 capsule (100 mg total) by mouth 2 (two) times daily. While taking narcotic pain medicine. 12/07/20   Jacinta Shoe, PA-C  HYDROcodone-acetaminophen (NORCO) 10-325 MG tablet TAKE 1 TABLET BY MOUTH 4 TIMES DAILY AS NEEDED 04/12/19   [provider]  lamoTRIgine (LAMICTAL) 100 MG tablet 1 in the morning and 1 at bedtime 08/16/21   Cottle, Steva Ready., MD  lisinopril (PRINIVIL,ZESTRIL) 20 MG tablet Take 20 mg by mouth daily.    [provider]  loratadine (CLARITIN) 10 MG tablet Take 10 mg by mouth at bedtime.     [provider]  magnesium 30 MG tablet Take 30 mg by mouth 2 (two) times daily.    [provider]  meloxicam (MOBIC) 15 MG tablet Take 15 mg by mouth daily. 03/20/19   [provider]  mometasone-formoterol (DULERA) 100-5 MCG/ACT AERO Inhale 2 puffs into the lungs 2 (two) times daily.    [provider]  montelukast (SINGULAIR) 10 MG tablet Take 1 tablet by mouth daily. Patient taking differently: Take 10 mg by mouth at bedtime. 12/15/14   Shade Flood, MD  Multiple Vitamins-Minerals [facility] COMPLETE PO) Take by mouth.    [provider]  pantoprazole (PROTONIX) 40 MG tablet Take 40 mg by mouth daily.    [provider]  rOPINIRole (REQUIP) 0.5 MG tablet TAKE 1 TABLETS BY MOUTH DAILY AT 5 PM THEN 1  TABLET AT BEDTIME 08/16/21   Cottle, Steva Ready., MD  senna (SENOKOT) 8.6 MG TABS tablet Take 2 tablets (17.2 mg total) by mouth 2 (two) times daily. 12/07/20   Jacinta Shoe, PA-C  topiramate (TOPAMAX) 50 MG tablet TAKE ONE TABLET BY MOUTH EVERY MORNING and TAKE TWO TABLETS at 5 PM 08/16/21   Cottle, Steva Ready., MD  traZODone (DESYREL) 100 MG tablet Take 1 tablet  (100 mg total) by mouth at bedtime. 08/16/21   Cottle, Steva Ready., MD  Vitamins A & D (VITAMIN A & D) ointment Apply 1 application topically as needed for dry skin.    [provider]  Omeprazole (PRILOSEC PO) Take 20.6 mg by mouth daily.  01/17/12  [provider]    Family History Family History  Problem Relation Age of Onset   Hypertension Mother        Deceased   Anxiety disorder Mother    Parkinson's disease Father        Deceased   Anxiety disorder Father    Anxiety disorder Brother    Anxiety disorder Son    Diabetes Son        Deceased, 18   Healthy Son  Social History Social History   Tobacco Use   Smoking status: Former    Types: Cigarettes    Quit date: 08/26/2008    Years since quitting: 12.9   Smokeless tobacco: Never  Vaping Use   Vaping Use: Never used  Substance Use Topics   Alcohol use: No    Alcohol/week: 0.0 standard drinks    Comment: hx of    Drug use: Yes    Comment: hx of years ago - marijuana      Allergies   Avocado and Penicillins   Review of Systems Review of Systems Per HPI  Physical Exam Triage Vital Signs ED Triage Vitals  Enc Vitals Group     BP 08/20/21 0840 138/84     Pulse Rate 08/20/21 0840 78     Resp 08/20/21 0840 18     Temp 08/20/21 0840 97.6 F (36.4 C)     Temp Source 08/20/21 0840 Oral     SpO2 08/20/21 0840 97 %     Weight --      Height --      Head Circumference --      Peak Flow --      Pain Score 08/20/21 0837 0     Pain Loc --      Pain Edu? --      Excl. in GC? --    No data found.  Updated Vital Signs BP 138/84 (BP Location: Right Arm)    Pulse 78    Temp 97.6 F (36.4 C) (Oral)    Resp 18    SpO2 97%   Visual Acuity Right Eye Distance:   Left Eye Distance:   Bilateral Distance:    Right Eye Near:   Left Eye Near:    Bilateral Near:     Physical Exam Vitals and nursing note reviewed.  Constitutional:      Appearance: Normal appearance.  HENT:     Head:  Atraumatic.     Right Ear: Tympanic membrane and external ear normal.     Left Ear: Tympanic membrane and external ear normal.     Nose: Rhinorrhea present.     Mouth/Throat:     Mouth: Mucous membranes are moist.     Pharynx: Posterior oropharyngeal erythema present.  Eyes:     Extraocular Movements: Extraocular movements intact.     Conjunctiva/sclera: Conjunctivae normal.  Cardiovascular:     Rate and Rhythm: Normal rate and regular rhythm.     Heart sounds: Normal heart sounds.  Pulmonary:     Effort: Pulmonary effort is normal.     Breath sounds: Normal breath sounds. No wheezing or rales.  Abdominal:     General: Bowel sounds are normal. There is no distension.     Palpations: Abdomen is soft.     Tenderness: There is no abdominal tenderness. There is no guarding.  Musculoskeletal:        General: Normal range of motion.     Cervical back: Normal range of motion and neck supple.  Skin:    General: Skin is warm and dry.  Neurological:     Mental Status: She is alert and oriented to person, place, and time.  Psychiatric:        Mood and Affect: Mood normal.        Thought Content: Thought content normal.   UC Treatments / Results  Labs (all labs ordered are listed, but only abnormal results are displayed) Labs Reviewed - No data to display  EKG   Radiology No results found.  Procedures Procedures (including critical care time)  Medications Ordered in UC Medications - No data to display  Initial Impression / Assessment and Plan / UC Course  I have reviewed the triage vital signs and the nursing notes.  Pertinent labs & imaging results that were available during my care of the patient were reviewed by me and considered in my medical decision making (see chart for details).     COVID positive on home COVID test, will treat with molnupiravir, Flonase, Phenergan DM and supportive home care.  Return for acutely worsening symptoms.  Final Clinical Impressions(s)  / UC Diagnoses   Final diagnoses:  COVID-19   Discharge Instructions   None    ED Prescriptions     Medication Sig Dispense Auth. Provider   promethazine-dextromethorphan (PROMETHAZINE-DM) 6.25-15 MG/5ML syrup Take 5 mLs by mouth 4 (four) times daily as needed. 100 mL Particia Nearing, PA-C   fluticasone [facility]) 50 MCG/ACT nasal spray Place 1 spray into both nostrils 2 (two) times daily. 16 g Particia Nearing, PA-C   molnupiravir EUA (LAGEVRIO) 200 mg CAPS capsule Take 4 capsules (800 mg total) by mouth 2 (two) times daily for 5 days. 40 capsule Particia Nearing, [place]      PDMP not reviewed this encounter.   Roosvelt Maser [place], [place] 08/21/21 [id number]

## 2021-08-24 DIAGNOSIS — I10 Essential (primary) hypertension: Secondary | ICD-10-CM | POA: Diagnosis not present

## 2021-08-24 DIAGNOSIS — K219 Gastro-esophageal reflux disease without esophagitis: Secondary | ICD-10-CM | POA: Diagnosis not present

## 2021-09-20 ENCOUNTER — Ambulatory Visit (HOSPITAL_COMMUNITY)
Admission: RE | Admit: 2021-09-20 | Discharge: 2021-09-20 | Disposition: A | Discharge status: Home / Self Care | Payer: Medicare HMO | Source: Ambulatory Visit | Attending: Family Medicine | Admitting: Family Medicine

## 2021-09-20 ENCOUNTER — Other Ambulatory Visit: Payer: Self-pay

## 2021-09-20 ENCOUNTER — Other Ambulatory Visit (HOSPITAL_COMMUNITY): Payer: Self-pay | Admitting: Family Medicine

## 2021-09-20 DIAGNOSIS — M25551 Pain in right hip: Secondary | ICD-10-CM | POA: Diagnosis not present

## 2021-09-20 DIAGNOSIS — M25552 Pain in left hip: Secondary | ICD-10-CM | POA: Insufficient documentation

## 2021-09-20 DIAGNOSIS — K219 Gastro-esophageal reflux disease without esophagitis: Secondary | ICD-10-CM | POA: Diagnosis not present

## 2021-09-20 DIAGNOSIS — Z0001 Encounter for general adult medical examination with abnormal findings: Secondary | ICD-10-CM | POA: Diagnosis not present

## 2021-09-20 DIAGNOSIS — Z6837 Body mass index (BMI) 37.0-37.9, adult: Secondary | ICD-10-CM | POA: Diagnosis not present

## 2021-09-20 DIAGNOSIS — Z1331 Encounter for screening for depression: Secondary | ICD-10-CM | POA: Diagnosis not present

## 2021-09-20 DIAGNOSIS — E6609 Other obesity due to excess calories: Secondary | ICD-10-CM | POA: Diagnosis not present

## 2021-09-20 DIAGNOSIS — E782 Mixed hyperlipidemia: Secondary | ICD-10-CM | POA: Diagnosis not present

## 2021-09-20 DIAGNOSIS — J4520 Mild intermittent asthma, uncomplicated: Secondary | ICD-10-CM | POA: Diagnosis not present

## 2021-09-20 DIAGNOSIS — I10 Essential (primary) hypertension: Secondary | ICD-10-CM | POA: Diagnosis not present

## 2021-09-25 DIAGNOSIS — K219 Gastro-esophageal reflux disease without esophagitis: Secondary | ICD-10-CM | POA: Diagnosis not present

## 2021-09-25 DIAGNOSIS — I10 Essential (primary) hypertension: Secondary | ICD-10-CM | POA: Diagnosis not present

## 2021-10-24 DIAGNOSIS — H903 Sensorineural hearing loss, bilateral: Secondary | ICD-10-CM | POA: Diagnosis not present

## 2021-10-24 DIAGNOSIS — R42 Dizziness and giddiness: Secondary | ICD-10-CM | POA: Diagnosis not present

## 2021-10-24 DIAGNOSIS — H9313 Tinnitus, bilateral: Secondary | ICD-10-CM | POA: Diagnosis not present

## 2021-10-31 DIAGNOSIS — M7062 Trochanteric bursitis, left hip: Secondary | ICD-10-CM | POA: Diagnosis not present

## 2021-10-31 DIAGNOSIS — M25552 Pain in left hip: Secondary | ICD-10-CM | POA: Diagnosis not present

## 2021-10-31 DIAGNOSIS — Z96642 Presence of left artificial hip joint: Secondary | ICD-10-CM | POA: Diagnosis not present

## 2021-11-11 DIAGNOSIS — M7062 Trochanteric bursitis, left hip: Secondary | ICD-10-CM | POA: Insufficient documentation

## 2021-11-21 DIAGNOSIS — M2021 Hallux rigidus, right foot: Secondary | ICD-10-CM | POA: Diagnosis not present

## 2021-11-21 DIAGNOSIS — T8484XA Pain due to internal orthopedic prosthetic devices, implants and grafts, initial encounter: Secondary | ICD-10-CM | POA: Diagnosis not present

## 2021-11-21 DIAGNOSIS — M79671 Pain in right foot: Secondary | ICD-10-CM | POA: Diagnosis not present

## 2021-11-21 DIAGNOSIS — M2041 Other hammer toe(s) (acquired), right foot: Secondary | ICD-10-CM | POA: Diagnosis not present

## 2021-11-22 DIAGNOSIS — G894 Chronic pain syndrome: Secondary | ICD-10-CM | POA: Diagnosis not present

## 2021-11-22 DIAGNOSIS — M48061 Spinal stenosis, lumbar region without neurogenic claudication: Secondary | ICD-10-CM | POA: Diagnosis not present

## 2021-11-22 DIAGNOSIS — M5459 Other low back pain: Secondary | ICD-10-CM | POA: Diagnosis not present

## 2021-11-22 DIAGNOSIS — M5417 Radiculopathy, lumbosacral region: Secondary | ICD-10-CM | POA: Diagnosis not present

## 2021-11-22 DIAGNOSIS — M5416 Radiculopathy, lumbar region: Secondary | ICD-10-CM | POA: Diagnosis not present

## 2021-12-13 DIAGNOSIS — G8929 Other chronic pain: Secondary | ICD-10-CM | POA: Diagnosis not present

## 2021-12-13 DIAGNOSIS — M5441 Lumbago with sciatica, right side: Secondary | ICD-10-CM | POA: Diagnosis not present

## 2021-12-23 DIAGNOSIS — K219 Gastro-esophageal reflux disease without esophagitis: Secondary | ICD-10-CM | POA: Diagnosis not present

## 2021-12-23 DIAGNOSIS — I10 Essential (primary) hypertension: Secondary | ICD-10-CM | POA: Diagnosis not present

## 2021-12-28 DIAGNOSIS — R69 Illness, unspecified: Secondary | ICD-10-CM | POA: Diagnosis not present

## 2021-12-28 DIAGNOSIS — I10 Essential (primary) hypertension: Secondary | ICD-10-CM | POA: Diagnosis not present

## 2021-12-28 DIAGNOSIS — M25551 Pain in right hip: Secondary | ICD-10-CM | POA: Diagnosis not present

## 2021-12-28 DIAGNOSIS — J4520 Mild intermittent asthma, uncomplicated: Secondary | ICD-10-CM | POA: Diagnosis not present

## 2021-12-28 DIAGNOSIS — E6609 Other obesity due to excess calories: Secondary | ICD-10-CM | POA: Diagnosis not present

## 2021-12-28 DIAGNOSIS — N812 Incomplete uterovaginal prolapse: Secondary | ICD-10-CM | POA: Diagnosis not present

## 2021-12-28 DIAGNOSIS — Z6836 Body mass index (BMI) 36.0-36.9, adult: Secondary | ICD-10-CM | POA: Diagnosis not present

## 2022-01-30 ENCOUNTER — Ambulatory Visit: Payer: Medicare HMO | Admitting: Nutrition

## 2022-02-16 IMAGING — MG MM BREAST BX W LOC DEV 1ST LESION IMAGE BX SPEC STEREO GUIDE*L*
8 of 11 series · 8 of 27 positions shown · non-contrast
Comparison: Previous exams.
COMPARISON: Previous exams.

Addendum:
CLINICAL DATA: Left breast calcifications.

EXAM:
LEFT BREAST STEREOTACTIC CORE NEEDLE BIOPSY

[L (1 of 6)]
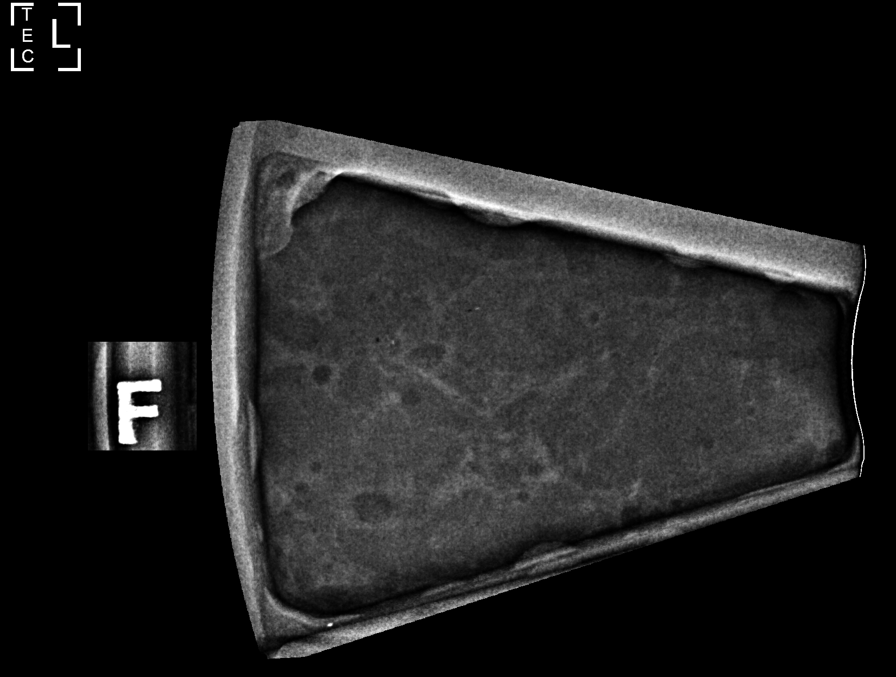

[L (2 of 6)]
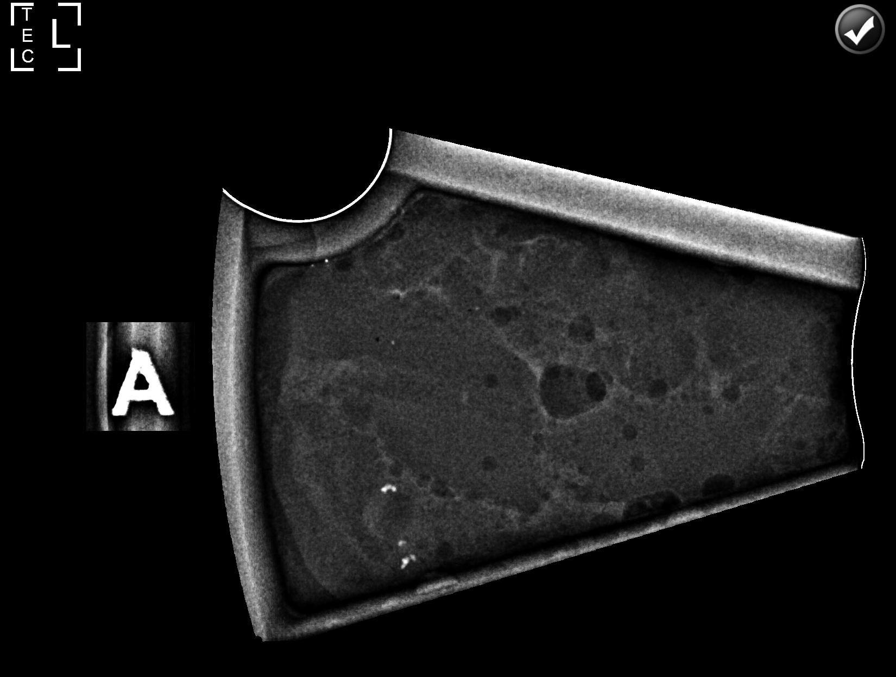

[L (3 of 6)]
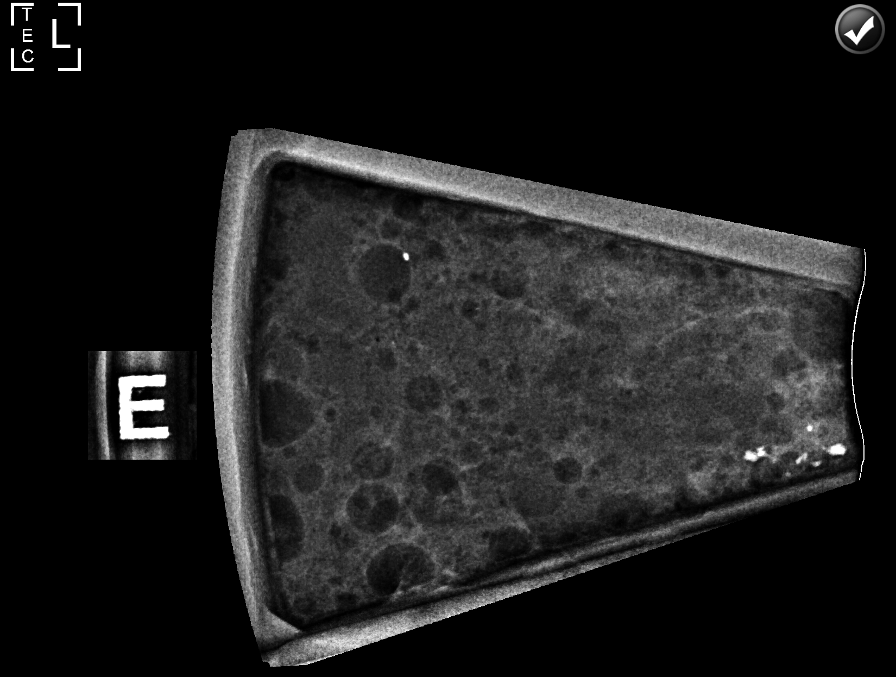

[L (4 of 6)]
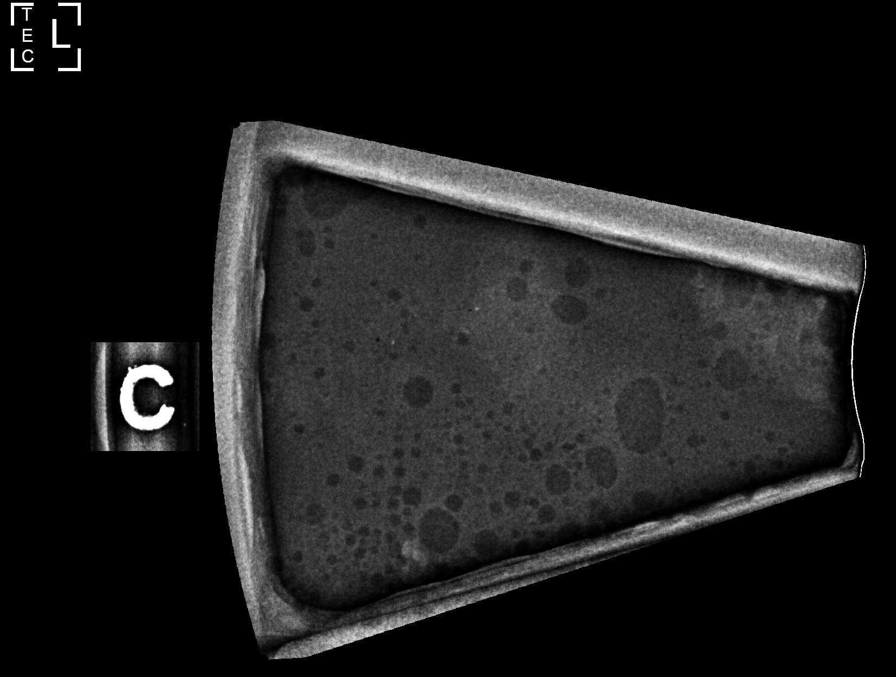

[L (5 of 6)]
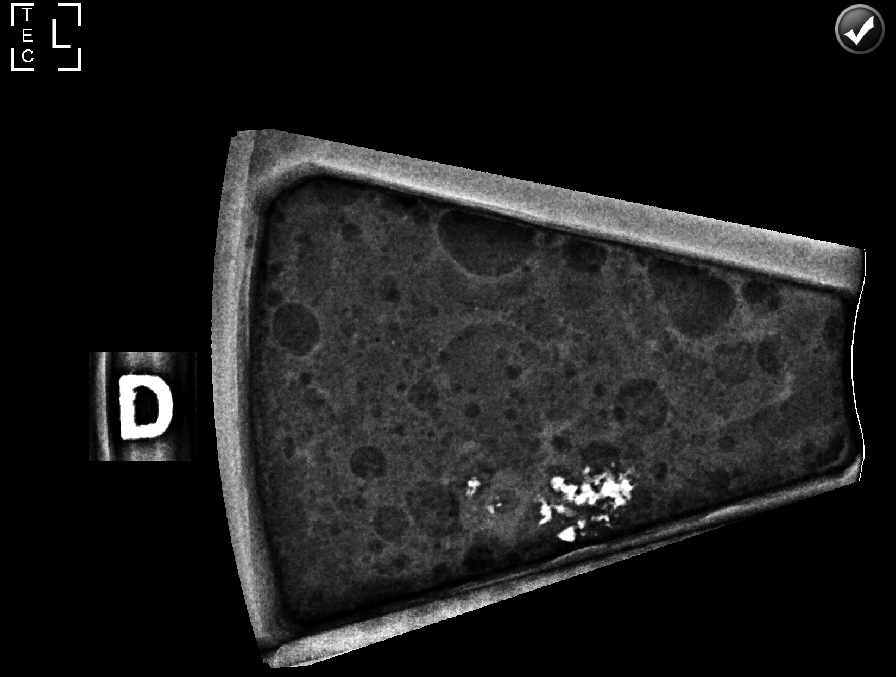

[L (6 of 6)]
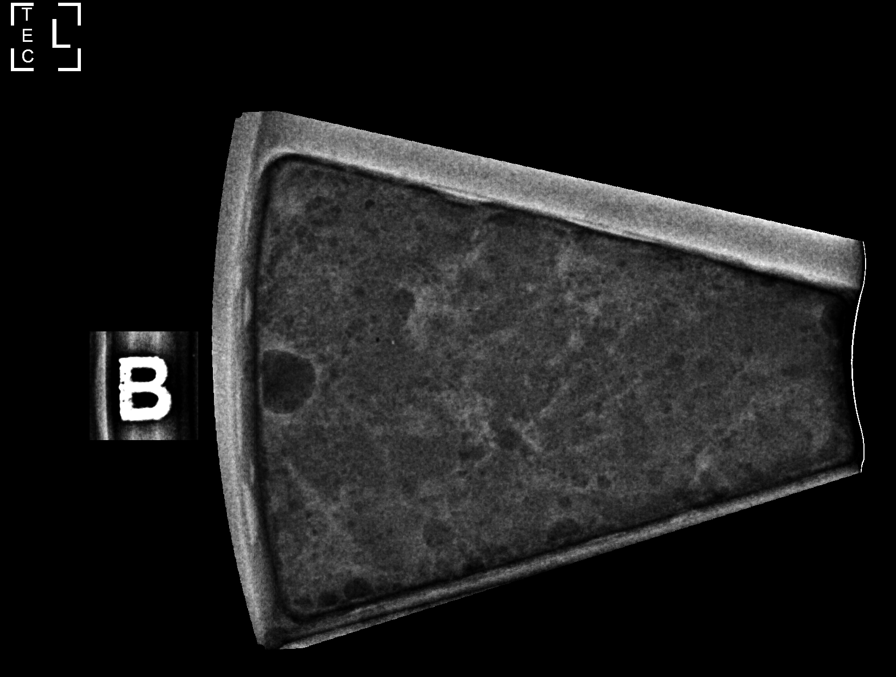

[L ML]
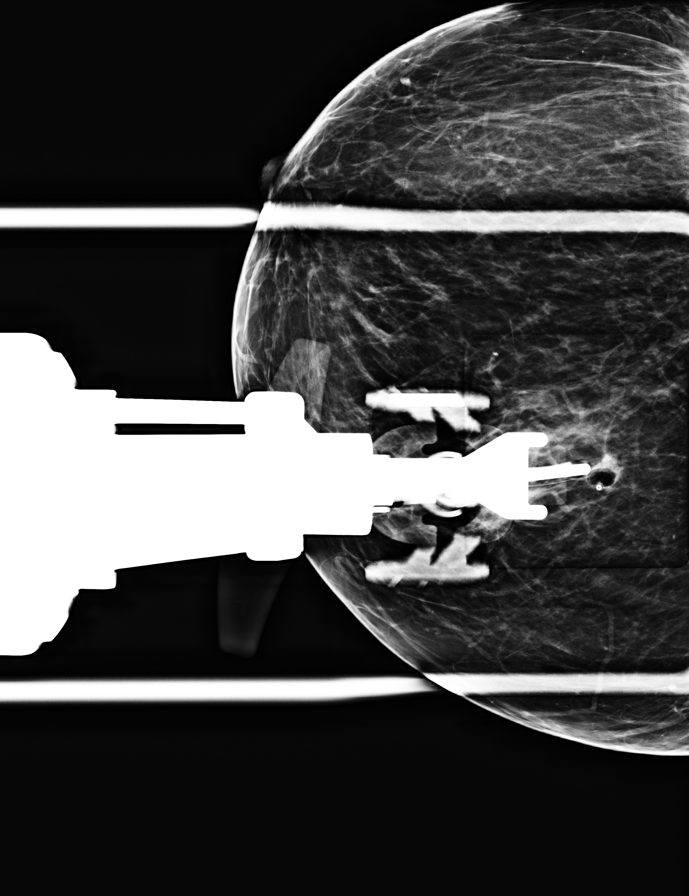

[L ML tomo · tomo slice 28/55.0]
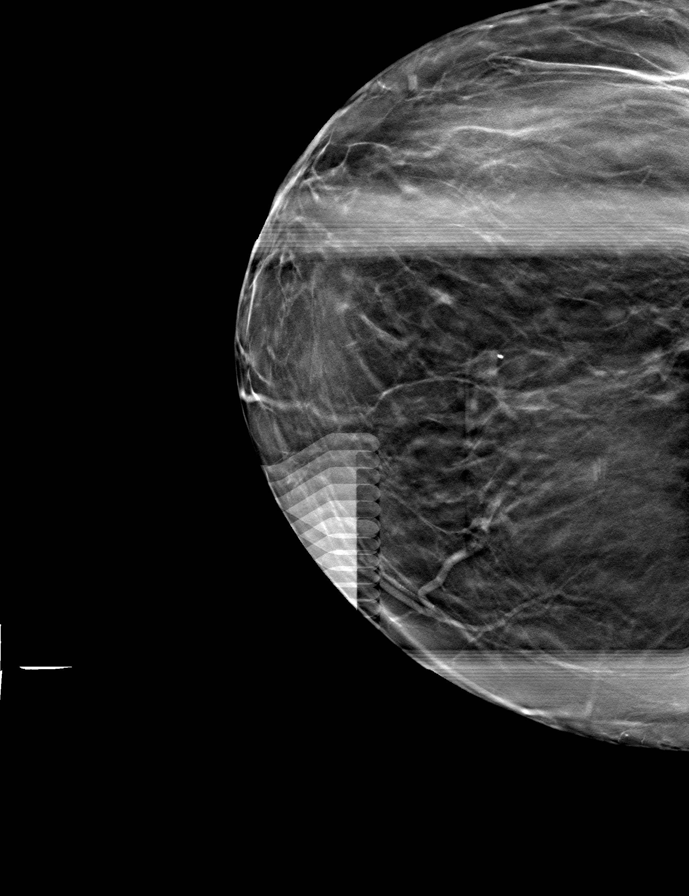

[8 of 27 positions shown; findings below may reference images not displayed]



Using sterile technique and 1% lidocaine and 2% lidocaine with
epinephrine as local anesthetic, under stereotactic guidance, a 9
gauge vacuum assisted device was used to perform core needle biopsy
of calcifications in the upper inner quadrant the left breast using
a medial to lateral approach. Specimen radiograph was performed
showing calcifications are present in the tissue samples. Specimens
with calcifications are identified for pathology.

Lesion quadrant: Upper inner quadrant

At the conclusion of the procedure, coil shaped tissue marker clip
was deployed into the biopsy cavity. Follow-up 2-view mammogram was
performed and dictated separately.
IMPRESSION: Stereotactic-guided biopsy of the left breast. No apparent
complications.

ADDENDUM:
Pathology revealed FIBROADENOMATOID CHANGE WITH CALCIFICATIONS of
the Left breast, upper inner quadrant, (coil clip). This was found
to be concordant by Dr. Kristal Zimmermann.

Pathology results were discussed with the patient by telephone. The
patient reported doing well after the biopsy with tenderness at the
site. Post biopsy instructions and care were reviewed and questions
were answered. The patient was encouraged to call The [REDACTED]

The patient was instructed to return for annual screening
mammography.

Pathology results reported by Rtoyota Joshjax, RN on 04/05/2021.



Using sterile technique and 1% lidocaine and 2% lidocaine with
epinephrine as local anesthetic, under stereotactic guidance, a 9
gauge vacuum assisted device was used to perform core needle biopsy
of calcifications in the upper inner quadrant the left breast using
a medial to lateral approach. Specimen radiograph was performed
showing calcifications are present in the tissue samples. Specimens
with calcifications are identified for pathology.

Lesion quadrant: Upper inner quadrant

At the conclusion of the procedure, coil shaped tissue marker clip
was deployed into the biopsy cavity. Follow-up 2-view mammogram was
performed and dictated separately.
IMPRESSION: Stereotactic-guided biopsy of the left breast. No apparent
complications.

## 2022-02-16 IMAGING — MG MM BREAST LOCALIZATION CLIP
4 series · 4 of 12 positions shown · non-contrast
Comparison: Previous exam(s).

CLINICAL DATA: Status post stereotactic biopsy of left breast
calcifications.

EXAM:
3D DIAGNOSTIC LEFT MAMMOGRAM POST STEREOTACTIC BIOPSY

[L CC synth-2D]
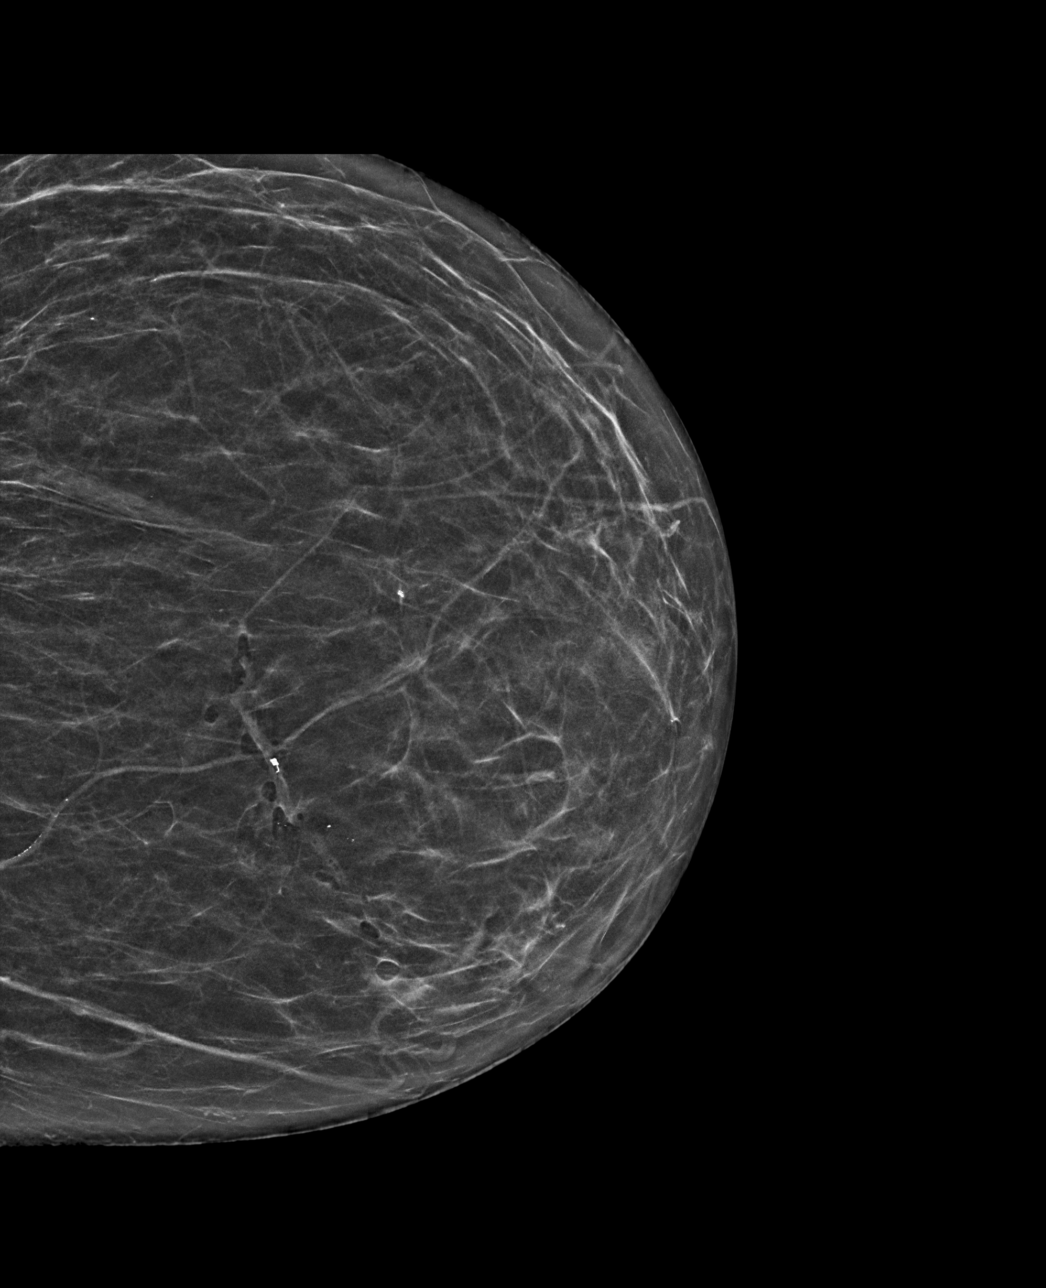

[L ML synth-2D]
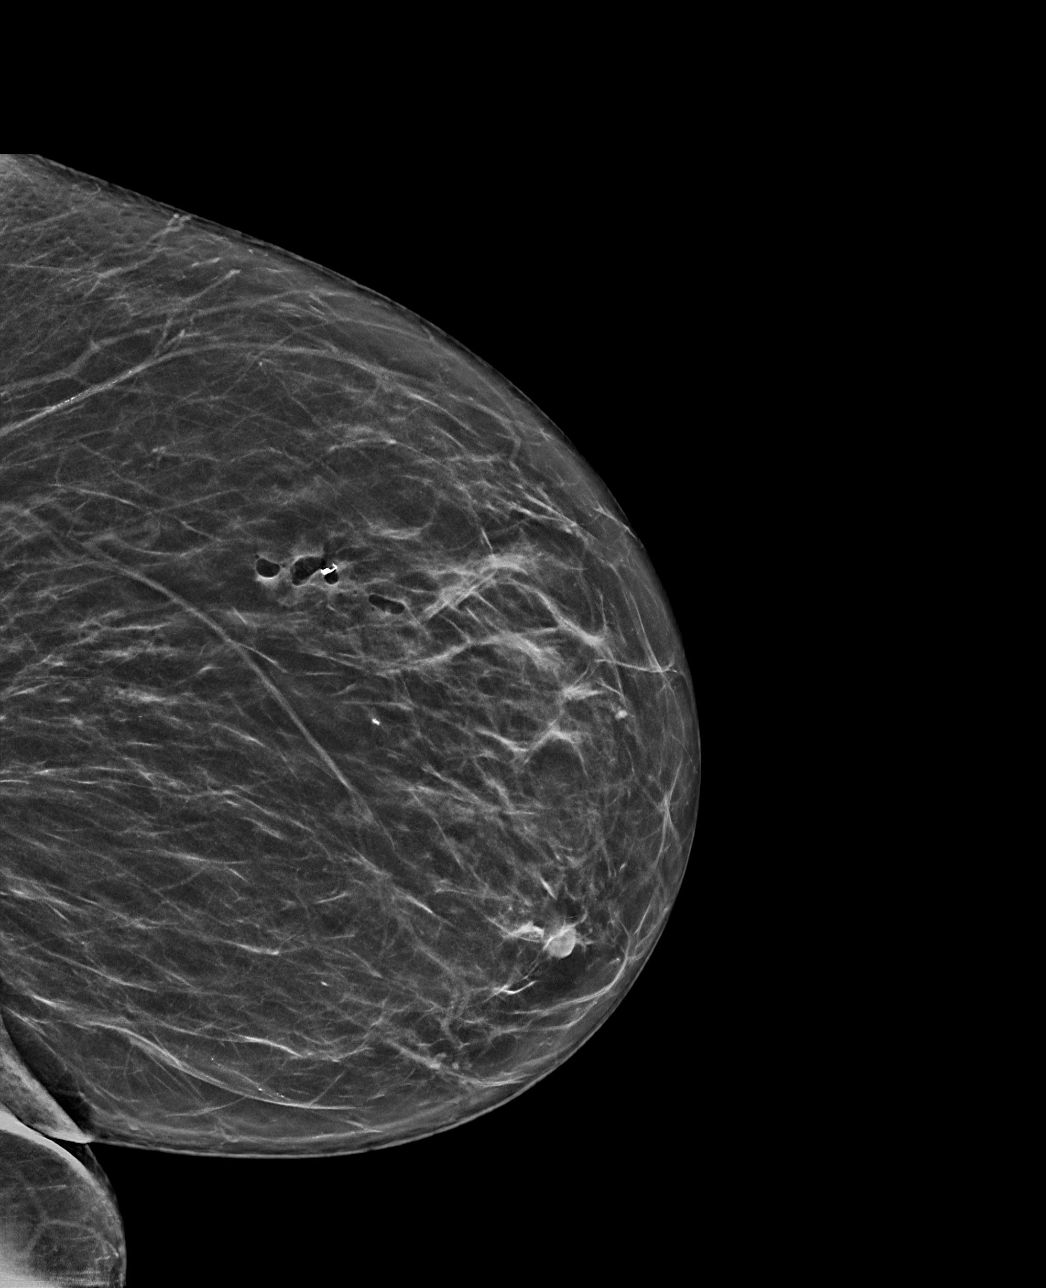

[L ML tomo · tomo slice 32/63.0]
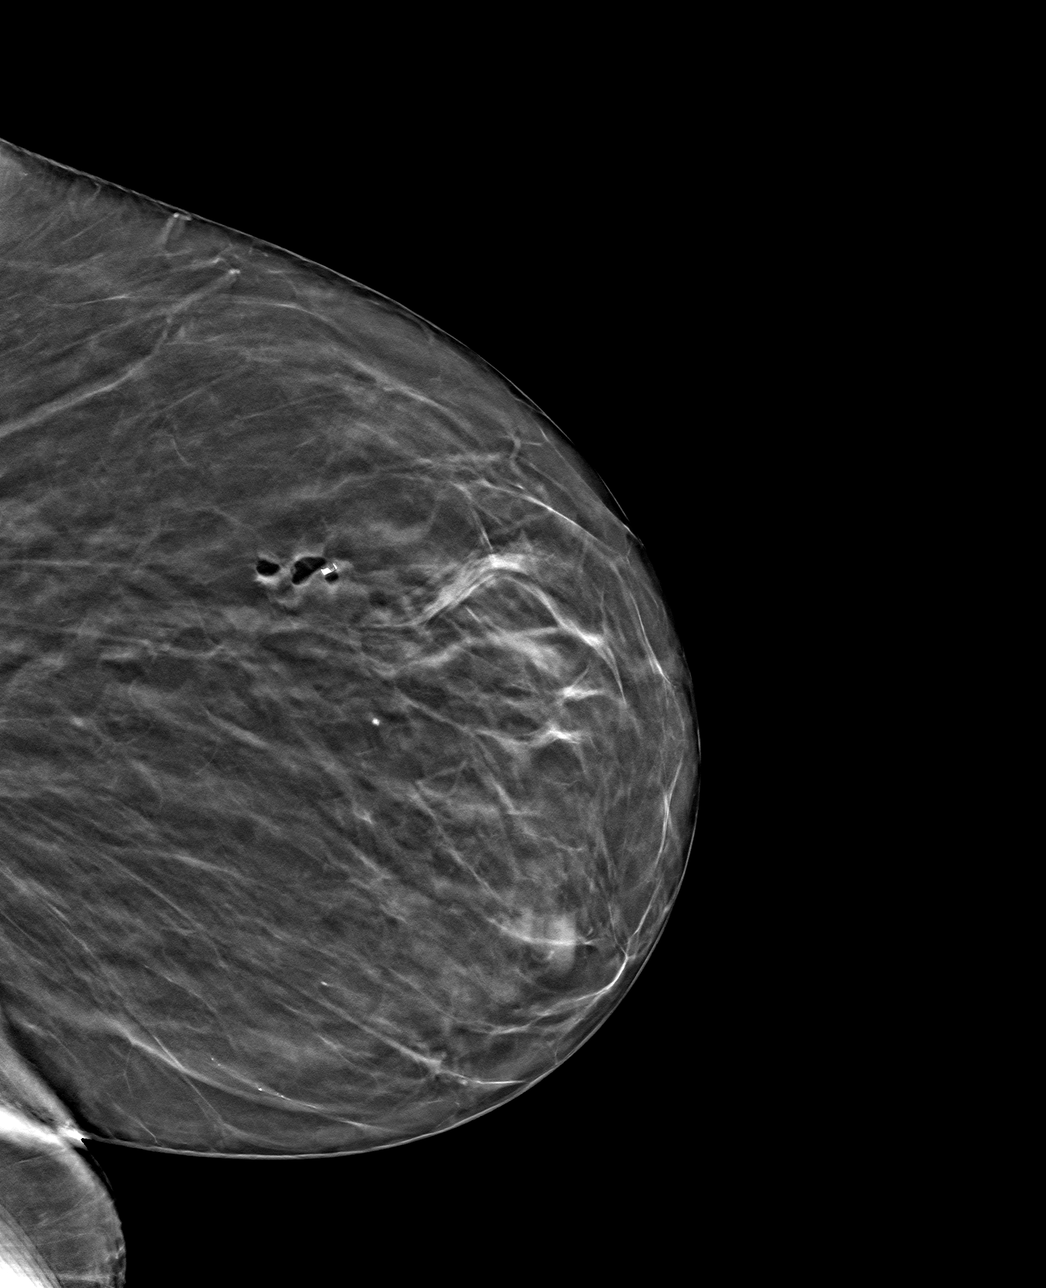

[L CC tomo · tomo slice 27/53.0]
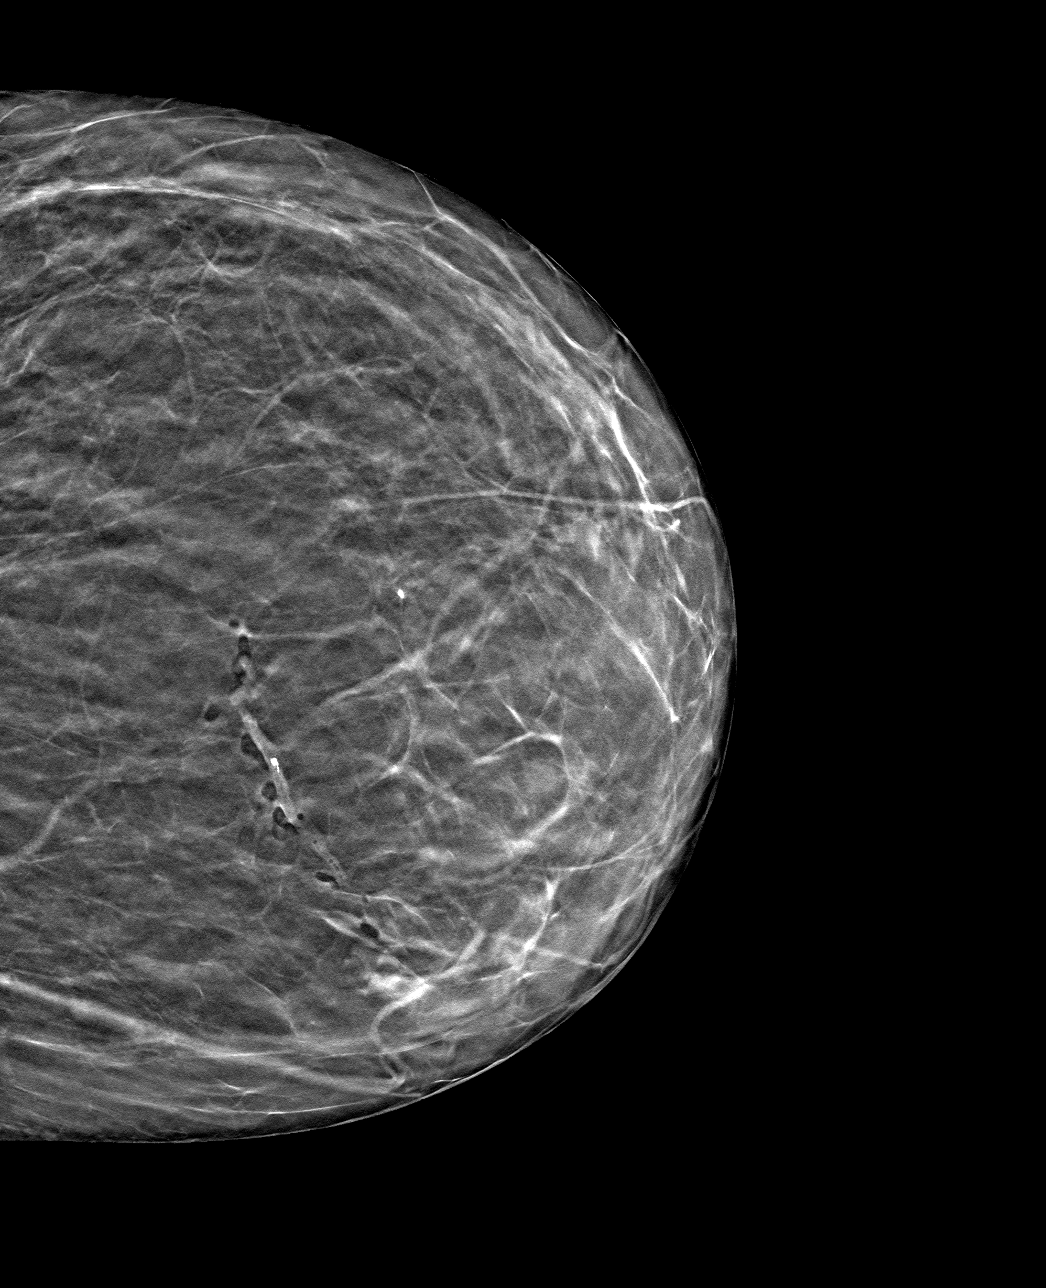

[4 of 12 positions shown; findings below may reference images not displayed]

FINDINGS: 3D Mammographic images were obtained following stereotactic guided
biopsy of calcifications in the upper inner quadrant of the left
breast. The biopsy marking clip is in expected location in upper
inner quadrant of the left breast.
IMPRESSION: Appropriate positioning of the coil shaped biopsy marking clip at
the site of biopsy in the upper-inner quadrant of the left breast.

Final Assessment: Post Procedure Mammograms for Marker Placement

## 2022-03-07 DIAGNOSIS — M48061 Spinal stenosis, lumbar region without neurogenic claudication: Secondary | ICD-10-CM | POA: Diagnosis not present

## 2022-03-07 DIAGNOSIS — K5903 Drug induced constipation: Secondary | ICD-10-CM | POA: Diagnosis not present

## 2022-03-07 DIAGNOSIS — Z79891 Long term (current) use of opiate analgesic: Secondary | ICD-10-CM | POA: Diagnosis not present

## 2022-03-07 DIAGNOSIS — M5416 Radiculopathy, lumbar region: Secondary | ICD-10-CM | POA: Diagnosis not present

## 2022-03-07 DIAGNOSIS — M5417 Radiculopathy, lumbosacral region: Secondary | ICD-10-CM | POA: Diagnosis not present

## 2022-03-07 DIAGNOSIS — Z5181 Encounter for therapeutic drug level monitoring: Secondary | ICD-10-CM | POA: Diagnosis not present

## 2022-04-08 DIAGNOSIS — M7061 Trochanteric bursitis, right hip: Secondary | ICD-10-CM | POA: Diagnosis not present

## 2022-04-08 DIAGNOSIS — M7062 Trochanteric bursitis, left hip: Secondary | ICD-10-CM | POA: Diagnosis not present

## 2022-04-08 DIAGNOSIS — M25552 Pain in left hip: Secondary | ICD-10-CM | POA: Diagnosis not present

## 2022-04-08 DIAGNOSIS — Z96643 Presence of artificial hip joint, bilateral: Secondary | ICD-10-CM | POA: Diagnosis not present

## 2022-04-08 DIAGNOSIS — M25551 Pain in right hip: Secondary | ICD-10-CM | POA: Diagnosis not present

## 2022-04-17 ENCOUNTER — Ambulatory Visit
Admission: EM | Admit: 2022-04-17 | Discharge: 2022-04-17 | Disposition: A | Discharge status: Home / Self Care | Payer: Medicare HMO | Attending: Nurse Practitioner | Admitting: Nurse Practitioner

## 2022-04-17 DIAGNOSIS — J029 Acute pharyngitis, unspecified: Secondary | ICD-10-CM

## 2022-04-17 LAB — POCT RAPID STREP A (OFFICE): Rapid Strep A Screen: NEGATIVE

## 2022-04-17 NOTE — Discharge Instructions (Signed)
Rapid strep test is negative.  A throat culture is pending.  You will be contacted if the results of the culture are positive to provide treatment. Increase fluids and allow for plenty of rest. Recommend continuing your current medication at this time. THey should help with pain, fever, or general discomfort. Recommend throat lozenges, Chloraseptic or honey to help with throat pain. Warm salt water gargles 3-4 times daily to help with throat pain or discomfort. Recommend a diet with soft foods to include soups, broths, puddings, yogurt, Jell-O's, or popsicles until symptoms improve. Follow-up if symptoms do not improve within the next 7-10 days.

## 2022-04-17 NOTE — ED Provider Notes (Signed)
[facility]    CSN: [id number] Arrival date & time: 04/17/22  0940      History   Chief Complaint Chief Complaint  Patient presents with   Sore Throat    HPI Cheryl Hoover is a 66 y.o. female.   The history is provided by the patient.   Patient presents for complaints of sore throat pain has been present for the past 4 days.  She states that she has increased pain with swallowing.  Patient was concerned because she had a mouth infection in May and was wondering if it moved down to her throat.  She denies fever, chills, nasal congestion, ear pain, headache, runny nose, cough, or GI symptoms.  Patient states that she was around her daughter over the past weekend who had a cough.  Patient declines COVID testing today.  She has not taken any medication for her symptoms, although she is on medications that contain acetaminophen and are in the NSAID family.  Past Medical History:  Diagnosis Date   Allergy    Anemia    Anxiety    Arthritis    Asthma    Bipolar disorder (HCC)    Complication of anesthesia    woke up during surgery    Depression    GERD (gastroesophageal reflux disease)    Hypertension    Scoliosis     Patient Active Problem List   Diagnosis Date Noted   Bipolar 2 disorder (HCC) 09/23/2018   Phobia 09/23/2018   Obese 02/04/2018   Overweight (BMI 25.0-29.9) 06/04/2017   S/P right THA, AA 06/03/2017   Lumbosacral radiculopathy 04/26/2015   Degenerative lumbar spinal stenosis 04/26/2015   Sliding hiatal hernia, recurrent 09/16/2011   ANXIETY DEPRESSION 04/21/2009   ASTHMA, MILD 04/21/2009   GERD 04/21/2009   CONSTIPATION 04/21/2009   SCOLIOSIS 04/21/2009   EARLY SATIETY 04/21/2009   ABDOMINAL PAIN, EPIGASTRIC 04/21/2009   ANOREXIA NERVOSA, HX OF 04/21/2009   BULIMIA, HX OF 04/21/2009    Past Surgical History:  Procedure Laterality Date   ARTHRODESIS METATARSALPHALANGEAL JOINT (MTPJ) Right 12/07/2020   Procedure: Right hallux  metatarsophalangeal joint arthrodesis and silver bunionectomy;  Surgeon: Toni Arthurs, MD;  Location: [facility];  Service: Orthopedics;  Laterality: Right;   c-sections  1983   CARPAL TUNNEL RELEASE     CESAREAN SECTION  1982   corrective eye surgery     HAMMERTOE RECONSTRUCTION WITH WEIL OSTEOTOMY Right 12/07/2020   Procedure: 2-3 weil and hammertoe corrections;  Surgeon: Toni Arthurs, MD;  Location: [facility];  Service: Orthopedics;  Laterality: Right;   HERNIA REPAIR  04/2012   hiatial hernia   lap nissen w/paraesophageal hernia repari  05-21-2010   TONSILECTOMY, ADENOIDECTOMY, BILATERAL MYRINGOTOMY AND TUBES     patient denies tubes in ears    TONSILLECTOMY     TOTAL HIP ARTHROPLASTY Right 06/03/2017   Procedure: RIGHT TOTAL HIP ARTHROPLASTY ANTERIOR APPROACH;  Surgeon: Durene Romans, MD;  Location: [facility];  Service: Orthopedics;  Laterality: Right;  70 mins   TOTAL HIP ARTHROPLASTY Left 02/03/2018   Procedure: LEFT TOTAL HIP ARTHROPLASTY ANTERIOR APPROACH;  Surgeon: Durene Romans, MD;  Location: [facility];  Service: Orthopedics;  Laterality: Left;  70 mins    OB History   No obstetric history on file.      Home Medications    Prior to Admission medications   Medication Sig Start Date End Date Taking? Authorizing Provider  acetaminophen (TYLENOL) 500 MG tablet Take 2  tablets (1,000 mg total) by mouth every 8 (eight) hours. 02/03/18   Lanney Gins, PA-C  albuterol (VENTOLIN HFA) 108 (90 BASE) MCG/ACT inhaler Inhale 2 puffs into the lungs every 4 (four) hours as needed for wheezing. Patient taking differently: Inhale 2 puffs into the lungs every 4 (four) hours as needed for wheezing or shortness of breath. 05/16/14   [name], MD  ALPRAZolam Prudy Feeler) 0.5 MG tablet Take 1 tablet (0.5 mg total) by mouth daily as needed for anxiety or sleep. 09/06/20   Cottle, Steva Ready., MD  docusate sodium (COLACE) 100 MG capsule Take 1 capsule (100 mg total) by  mouth 2 (two) times daily. While taking narcotic pain medicine. 12/07/20   Jacinta Shoe, PA-C  fluticasone [facility]) 50 MCG/ACT nasal spray Place 1 spray into both nostrils 2 (two) times daily. 08/20/21   Particia Nearing, PA-C  HYDROcodone-acetaminophen (NORCO) 10-325 MG tablet TAKE 1 TABLET BY MOUTH 4 TIMES DAILY AS NEEDED 04/12/19   [provider]  lamoTRIgine (LAMICTAL) 100 MG tablet 1 in the morning and 1 at bedtime 08/16/21   Cottle, Steva Ready., MD  lisinopril (PRINIVIL,ZESTRIL) 20 MG tablet Take 20 mg by mouth daily.    [provider]  loratadine (CLARITIN) 10 MG tablet Take 10 mg by mouth at bedtime.     [provider]  magnesium 30 MG tablet Take 30 mg by mouth 2 (two) times daily.    [provider]  meloxicam (MOBIC) 15 MG tablet Take 15 mg by mouth daily. 03/20/19   [provider]  mometasone-formoterol (DULERA) 100-5 MCG/ACT AERO Inhale 2 puffs into the lungs 2 (two) times daily.    [provider]  montelukast (SINGULAIR) 10 MG tablet Take 1 tablet by mouth daily. Patient taking differently: Take 10 mg by mouth at bedtime. 12/15/14   Shade Flood, MD  Multiple Vitamins-Minerals [facility] COMPLETE PO) Take by mouth.    [provider]  pantoprazole (PROTONIX) 40 MG tablet Take 40 mg by mouth daily.    [provider]  promethazine-dextromethorphan (PROMETHAZINE-DM) 6.25-15 MG/5ML syrup Take 5 mLs by mouth 4 (four) times daily as needed. 08/20/21   Particia Nearing, PA-C  rOPINIRole (REQUIP) 0.5 MG tablet TAKE 1 TABLETS BY MOUTH DAILY AT 5 PM THEN 1  TABLET AT BEDTIME 08/16/21   Cottle, Steva Ready., MD  senna (SENOKOT) 8.6 MG TABS tablet Take 2 tablets (17.2 mg total) by mouth 2 (two) times daily. 12/07/20   Jacinta Shoe, PA-C  topiramate (TOPAMAX) 50 MG tablet TAKE ONE TABLET BY MOUTH EVERY MORNING and TAKE TWO TABLETS at 5 PM 08/16/21   Cottle, Steva Ready., MD  traZODone (DESYREL) 100 MG  tablet Take 1 tablet (100 mg total) by mouth at bedtime. 08/16/21   Cottle, Steva Ready., MD  Vitamins A & D (VITAMIN A & D) ointment Apply 1 application topically as needed for dry skin.    [provider]  Omeprazole (PRILOSEC PO) Take 20.6 mg by mouth daily.  01/17/12  [provider]    Family History Family History  Problem Relation Age of Onset   Hypertension Mother        Deceased   Anxiety disorder Mother    Parkinson's disease Father        Deceased   Anxiety disorder Father    Anxiety disorder Brother    Anxiety disorder Son    Diabetes Son  Deceased, 26   Healthy Son     Social History Social History   Tobacco Use   Smoking status: Former    Types: Cigarettes    Quit date: 08/26/2008    Years since quitting: 13.6   Smokeless tobacco: Never  Vaping Use   Vaping Use: Never used  Substance Use Topics   Alcohol use: No    Alcohol/week: 0.0 standard drinks of alcohol    Comment: hx of    Drug use: Yes    Comment: hx of years ago - marijuana      Allergies   Avocado and Penicillins   Review of Systems Review of Systems Per HPI  Physical Exam Triage Vital Signs ED Triage Vitals  Enc Vitals Group     BP 04/17/22 1028 (!) 168/96     Pulse Rate 04/17/22 1028 83     Resp 04/17/22 1028 18     Temp 04/17/22 1028 98.5 F (36.9 C)     Temp Source 04/17/22 1028 Oral     SpO2 04/17/22 1028 97 %     Weight --      Height --      Head Circumference --      Peak Flow --      Pain Score 04/17/22 1027 6     Pain Loc --      Pain Edu? --      Excl. in GC? --    No data found.  Updated Vital Signs BP (!) 168/96 (BP Location: Right Arm)   Pulse 83   Temp 98.5 F (36.9 C) (Oral)   Resp 18   SpO2 97%   Visual Acuity Right Eye Distance:   Left Eye Distance:   Bilateral Distance:    Right Eye Near:   Left Eye Near:    Bilateral Near:     Physical Exam Vitals and nursing note reviewed.  Constitutional:      General: She is  not in acute distress.    Appearance: She is well-developed.  HENT:     Head: Normocephalic.     Right Ear: Tympanic membrane and ear canal normal.     Left Ear: Tympanic membrane and ear canal normal.     Nose: No congestion or rhinorrhea.     Mouth/Throat:     Mouth: Mucous membranes are moist.     Pharynx: Pharyngeal swelling and posterior oropharyngeal erythema present.     Tonsils: No tonsillar exudate. 1+ on the right. 1+ on the left.  Eyes:     Conjunctiva/sclera: Conjunctivae normal.     Pupils: Pupils are equal, round, and reactive to light.  Cardiovascular:     Rate and Rhythm: Normal rate and regular rhythm.     Heart sounds: Normal heart sounds.  Pulmonary:     Effort: Pulmonary effort is normal.     Breath sounds: Normal breath sounds.  Abdominal:     General: Bowel sounds are normal.     Palpations: Abdomen is soft.     Tenderness: There is no abdominal tenderness.  Musculoskeletal:     Cervical back: Normal range of motion.  Lymphadenopathy:     Cervical: No cervical adenopathy.  Skin:    General: Skin is warm and dry.  Neurological:     General: No focal deficit present.     Mental Status: She is alert and oriented to person, place, and time.  Psychiatric:        Mood and Affect: Mood normal.  UC Treatments / Results  Labs (all labs ordered are listed, but only abnormal results are displayed) Labs Reviewed  CULTURE, GROUP A STREP [facility])  POCT RAPID STREP A (OFFICE)    EKG   Radiology No results found.  Procedures Procedures (including critical care time)  Medications Ordered in UC Medications - No data to display  Initial Impression / Assessment and Plan / UC Course  I have reviewed the triage vital signs and the nursing notes.  Pertinent labs & imaging results that were available during my care of the patient were reviewed by me and considered in my medical decision making (see chart for details).  Patient presents for complaints of  sore throat that been present for the past 4 days.  On exam, patient's vital signs are stable, she is in no acute distress.  Patient does not have cervical adenopathy on her exam, nor is there petechiae.  Rapid strep test is negative, throat culture is pending.  Symptoms appear to be of viral etiology at this time.  Patient declined COVID testing.  Supportive care recommendations were provided to the patient.  Patient is currently on medications that contain acetaminophen, and on an NSAID.  Advised patient to continue these medications at this time.  Patient advised to follow-up in this clinic or with her primary care physician if symptoms fail to improve. Final Clinical Impressions(s) / UC Diagnoses   Final diagnoses:  Acute pharyngitis, unspecified etiology     Discharge Instructions      Rapid strep test is negative.  A throat culture is pending.  You will be contacted if the results of the culture are positive to provide treatment. Increase fluids and allow for plenty of rest. Recommend continuing your current medication at this time. THey should help with pain, fever, or general discomfort. Recommend throat lozenges, Chloraseptic or honey to help with throat pain. Warm salt water gargles 3-4 times daily to help with throat pain or discomfort. Recommend a diet with soft foods to include soups, broths, puddings, yogurt, Jell-O's, or popsicles until symptoms improve. Follow-up if symptoms do not improve within the next 7-10 days.       ED Prescriptions   None    PDMP not reviewed this encounter.   Abran Cantor, NP 04/17/22 1225

## 2022-04-17 NOTE — ED Triage Notes (Signed)
Pt reports sore throat and difficulty swallowing x 4 days.

## 2022-04-20 LAB — CULTURE, GROUP A STREP (THRC)

## 2022-04-22 DIAGNOSIS — M7061 Trochanteric bursitis, right hip: Secondary | ICD-10-CM | POA: Insufficient documentation

## 2022-04-30 DIAGNOSIS — H5203 Hypermetropia, bilateral: Secondary | ICD-10-CM | POA: Diagnosis not present

## 2022-05-14 ENCOUNTER — Ambulatory Visit: Payer: Medicare HMO | Admitting: Psychiatry

## 2022-05-14 ENCOUNTER — Encounter: Payer: Self-pay | Admitting: Psychiatry

## 2022-05-14 DIAGNOSIS — F5105 Insomnia due to other mental disorder: Secondary | ICD-10-CM

## 2022-05-14 DIAGNOSIS — F3181 Bipolar II disorder: Secondary | ICD-10-CM | POA: Diagnosis not present

## 2022-05-14 DIAGNOSIS — F411 Generalized anxiety disorder: Secondary | ICD-10-CM | POA: Diagnosis not present

## 2022-05-14 DIAGNOSIS — G2581 Restless legs syndrome: Secondary | ICD-10-CM | POA: Diagnosis not present

## 2022-05-14 DIAGNOSIS — R69 Illness, unspecified: Secondary | ICD-10-CM | POA: Diagnosis not present

## 2022-05-14 DIAGNOSIS — F4001 Agoraphobia with panic disorder: Secondary | ICD-10-CM

## 2022-05-14 MED ORDER — ALPRAZOLAM 0.5 MG PO TABS: 0.5000 mg | ORAL_TABLET | Freq: Every day | ORAL | 0 refills | Status: AC | PRN

## 2022-05-14 NOTE — Progress Notes (Signed)
Cheryl Hoover [id number] [birth year] 66 y.o.   Subjective:   Patient ID:  Cheryl Hoover is a 66 y.o. (DOB August 26, 1956) female.  Chief Complaint:  Chief Complaint  Patient presents with   Anxiety   Follow-up    Bipolar II disorder (HCC)    HPI Cheryl Hoover presents to the office today for follow-up of bipolar and anxiety.   seen April 2020.  Topiramate was reduced to 50 mg in the morning and 100 mg in the evening.  No other meds were changed.  seen October 2020.  The following was noted: Forgot to reduce the topiramate and has continued 100 mg BID.  12/21/19 appt noted the following without med changes except as noted: Kind of down in the dumps.  Doesn't like being at home so much.  Not sure of the cause.  Things are a lot different for [place] bc so isolated and doing little.  Like to travel and can't now.  But did drive across the country and it was fun but not every day is like that.  Still tend to be short-tempered but Ok overall.  Low appetite.  Likes snacks.  Eats supper. Question if med related. Sedentary.  Doesn't know how to lose weight.  Weight stable Rare Xanax.  Doesn't like to drive but will do it. The only med change today is to decrease topiramate 100 mg tablets  To one half every morning and 1 every afternoon.  She didn't do this last time and appetite is still poor.   December 21, 2019 appointment, the following is noted: Compliant with meds. Wants change in clonazepam.  Tried to reduce to 1 1/2 tablets at night and couldn't sleep.  Currently sleeping well.  Except for bad dreams for 6 mos.  Usually don't awaken her at night.  Spouse woke her recently bc of talking in bad dream.Spke with clinical pharmacist who suggested trying to stop.    No SE.   For awhile appetite was better but still don't eat well.  No desire for food daytime until night and poor choices longterm.  No weight changes.  Little depression.  Not bad. Plan: The only med change today is to decrease  topiramate 100 mg tablets  To one half every morning and 1 every afternoon.  She didn't do this last time and appetite is still poor.   09/06/20 appt with following noted: Rarely takes alprazolam. Stopped clonazepam and takes 100 mg trazodone and sleeps well.  Clonazepam hard to stop with WD and reduced later by 1/4 tablet every 2 weeks and took a couple of mos to get used to the change. Plan no changes.  08/16/21 appt noted: Pretty much ok since here.  Only bothered by family issues.   Satisfied with meds.  No SE problem except PM lamotrigine makes her sleepy.   Wants ropinirole written for 0.5 mg 1 at 5 and 1 at bedtime and trazodone written for 100 mg HS. Tried video therapist with young female therapist and didn't work out. Plan: No med changes Continue lamotrigine 100 mg twice daily Continue alprazolam 0.5 mg daily as needed, used infrequently, Continue ropinirole 0.5 mg every 5 p.m. and at bedtime for restless legs Continue topiramate 50 mg every morning and 100 mg at 5 PM for anxiety and appetite control Continue trazodone 100 nightly  05/14/2022 appointment noted: Alright I guess.  Fine with meds.  Not as cheerful as I could be.   Stress with kids.    RLS  and sleep managed with meds.  Occ leg cramps. [facility]stream pharmacy bubble packs bc was mixing up topiramate and ropinirole.  Consistent with meds. No SE. Trazodone really helped. Rare panic.  A few phobias like plane being trapped in a small space and dentist.  Patient reports stable mood and denies or irritable moods.  Patient reports recent difficulty with anxiety mainly claustrophobia.. Occ bouts of feeling on edge of anxiety, speeded up but not really manic and not severe.    Patient denies difficulty with sleep initiation or maintenance. Poor appetite disturbance chronically without change.  Patient reports that energy and motivation have been chronically poor.  Patient denies any difficulty with concentration.  Patient denies any  suicidal ideation.  No panic lately. No mania.  RLS managed.  Past Psychiatric Medication Trials: Wellbutrin, Pristiq, Nardil, Prozac, Paxil hypomania, Zoloft, imipramine, desipramine, Wellbutrin, Serzone 600 mg, buspirone, clonazepam,  topiramate 100 mg twice daily, lamotrigine 100 mg twice daily, carbamazepine,  Saphris, gabapentin,  Latuda,   ropinirole,   Trazodone helped  Review of Systems:  Review of Systems  Constitutional:  Negative for appetite change and unexpected weight change.  HENT:  Positive for tinnitus.   Cardiovascular:  Negative for palpitations.  Musculoskeletal:  Positive for arthralgias. Negative for gait problem.  Neurological:  Negative for tremors and headaches.    Medications: I have reviewed the patient's current medications.  Current Outpatient Medications  Medication Sig Dispense Refill   acetaminophen (TYLENOL) 500 MG tablet Take 2 tablets (1,000 mg total) by mouth every 8 (eight) hours. 30 tablet 0   albuterol (VENTOLIN HFA) 108 (90 BASE) MCG/ACT inhaler Inhale 2 puffs into the lungs every 4 (four) hours as needed for wheezing. (Patient taking differently: Inhale 2 puffs into the lungs every 4 (four) hours as needed for wheezing or shortness of breath.) 18 g 6   docusate sodium (COLACE) 100 MG capsule Take 1 capsule (100 mg total) by mouth 2 (two) times daily. While taking narcotic pain medicine. 30 capsule 0   fluticasone (FLONASE) 50 MCG/ACT nasal spray Place 1 spray into both nostrils 2 (two) times daily. 16 g 2   HYDROcodone-acetaminophen (NORCO) 10-325 MG tablet TAKE 1 TABLET BY MOUTH 4 TIMES DAILY AS NEEDED     lamoTRIgine (LAMICTAL) 100 MG tablet 1 in the morning and 1 at bedtime 180 tablet 3   lisinopril (PRINIVIL,ZESTRIL) 20 MG tablet Take 20 mg by mouth daily.     loratadine (CLARITIN) 10 MG tablet Take 10 mg by mouth at bedtime.      magnesium 30 MG tablet Take 30 mg by mouth 2 (two) times daily.     meloxicam (MOBIC) 15 MG tablet Take 15 mg by  mouth daily.     mometasone-formoterol (DULERA) 100-5 MCG/ACT AERO Inhale 2 puffs into the lungs 2 (two) times daily.     montelukast (SINGULAIR) 10 MG tablet Take 1 tablet by mouth daily. (Patient taking differently: Take 10 mg by mouth at bedtime.) 90 tablet 1   Multiple Vitamins-Minerals (MULTI COMPLETE PO) Take by mouth.     pantoprazole (PROTONIX) 40 MG tablet Take 40 mg by mouth daily.     promethazine-dextromethorphan (PROMETHAZINE-DM) 6.25-15 MG/5ML syrup Take 5 mLs by mouth 4 (four) times daily as needed. 100 mL 0   rOPINIRole (REQUIP) 0.5 MG tablet TAKE 1 TABLETS BY MOUTH DAILY AT 5 PM THEN 1  TABLET AT BEDTIME 180 tablet 3   senna (SENOKOT) 8.6 MG TABS tablet Take 2 tablets (17.2 mg total) by  mouth 2 (two) times daily. 30 tablet 0   topiramate (TOPAMAX) 50 MG tablet TAKE ONE TABLET BY MOUTH EVERY MORNING and TAKE TWO TABLETS at 5 PM 270 tablet 3   traZODone (DESYREL) 100 MG tablet Take 1 tablet (100 mg total) by mouth at bedtime. 90 tablet 3   Vitamins A & D (VITAMIN A & D) ointment Apply 1 application topically as needed for dry skin.     ALPRAZolam (XANAX) 0.5 MG tablet Take 1 tablet (0.5 mg total) by mouth daily as needed for anxiety or sleep. 30 tablet 0   No current facility-administered medications for this visit.    Medication Side Effects: Appetite Suppression and Other: ?  Allergies:  Allergies  Allergen Reactions   Avocado Itching   Penicillins Other (See Comments)    Unknown,childhood allergy  Has patient had a PCN reaction causing immediate rash, facial/tongue/throat swelling, SOB or lightheadedness with hypotension: Unknown Has patient had a PCN reaction causing severe rash involving mucus membranes or skin necrosis: Unknown Has patient had a PCN reaction that required hospitalization: Unknown Has patient had a PCN reaction occurring within the last 10 years: No If all of the above answers are "NO", then may proceed with Cephalosporin use.     Past Medical  History:  Diagnosis Date   Allergy    Anemia    Anxiety    Arthritis    Asthma    Bipolar disorder (HCC)    Complication of anesthesia    woke up during surgery    Depression    GERD (gastroesophageal reflux disease)    Hypertension    Scoliosis     Family History  Problem Relation Age of Onset   Hypertension Mother        Deceased   Anxiety disorder Mother    Parkinson's disease Father        Deceased   Anxiety disorder Father    Anxiety disorder Brother    Anxiety disorder Son    Diabetes Son        Deceased, 81   Healthy Son     Social History   Socioeconomic History   Marital status: Married    Spouse name: Not on file   Number of children: 2   Years of education: Not on file   Highest education level: Not on file  Occupational History   Occupation: None  Tobacco Use   Smoking status: Former    Types: Cigarettes    Quit date: 08/26/2008    Years since quitting: 13.7   Smokeless tobacco: Never  Vaping Use   Vaping Use: Never used  Substance and Sexual Activity   Alcohol use: No    Alcohol/week: 0.0 standard drinks of alcohol    Comment: hx of    Drug use: Yes    Comment: hx of years ago - marijuana    Sexual activity: Not Currently  Other Topics Concern   Not on file  Social History Narrative   She was last working in March 2016 and laid off.  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   Highest level of education:  High school   She lives alone.     Social Determinants of Health   Financial Resource Strain: Not on file  Food Insecurity: Not on file  Transportation Needs: Not on file  Physical Activity: Not on file  Stress: Not on file  Social Connections: Not on file  Intimate Partner  Violence: Not on file    Past Medical History, Surgical history, Social history, and Family history were reviewed and updated as appropriate.   Please see review of systems for further details on the patient's review  from today.   Objective:   Physical Exam:  There were no vitals taken for this visit.  Physical Exam Exam conducted with a chaperone present.  Constitutional:      General: She is not in acute distress.    Appearance: She is well-developed.  Musculoskeletal:        General: No deformity.  Neurological:     Mental Status: She is alert and oriented to person, place, and time.     Cranial Nerves: No dysarthria.     Coordination: Coordination normal.  Psychiatric:        Attention and Perception: Attention and perception normal. She does not perceive auditory or visual hallucinations.        Mood and Affect: Mood is not anxious or depressed. Affect is not labile, angry or inappropriate.        Speech: Speech normal.        Behavior: Behavior normal. Behavior is cooperative.        Thought Content: Thought content normal. Thought content is not paranoid or delusional. Thought content does not include homicidal or suicidal ideation. Thought content does not include suicidal plan.        Cognition and Memory: Cognition and memory normal.        Judgment: Judgment normal.     Comments: Chronic low grade irritability but not a problem.      Lab Review:     Component Value Date/Time   NA 138 07/06/2020 1116   K 4.0 07/06/2020 1116   CL 106 07/06/2020 1116   CO2 23 07/06/2020 1116   GLUCOSE 106 (H) 07/06/2020 1116   BUN 18 07/06/2020 1116   CREATININE 1.22 (H) 07/06/2020 1116   CREATININE 1.05 12/15/2014 1424   CALCIUM 9.3 07/06/2020 1116   PROT 6.1 12/15/2014 1424   ALBUMIN 4.0 12/15/2014 1424   AST 13 12/15/2014 1424   ALT 10 12/15/2014 1424   ALKPHOS 72 12/15/2014 1424   BILITOT 0.6 12/15/2014 1424   GFRNONAA 50 (L) 07/06/2020 1116   GFRNONAA 59 (L) 12/15/2014 1424   GFRAA >60 02/04/2018 0531   GFRAA 68 12/15/2014 1424       Component Value Date/Time   WBC 4.9 07/06/2020 1116   RBC 3.89 07/06/2020 1116   HGB 12.3 07/06/2020 1116   HCT 38.3 07/06/2020 1116   PLT  222 07/06/2020 1116   MCV 98.5 07/06/2020 1116   MCH 31.6 07/06/2020 1116   MCHC 32.1 07/06/2020 1116   RDW 12.8 07/06/2020 1116   LYMPHSABS 1.6 07/06/2020 1116   MONOABS 0.3 07/06/2020 1116   EOSABS 0.1 07/06/2020 1116   BASOSABS 0.0 07/06/2020 1116    No results found for: "POCLITH", "LITHIUM"   No results found for: "PHENYTOIN", "PHENOBARB", "VALPROATE", "CBMZ"   .res Assessment: Plan:    Bipolar II disorder ([place])  Generalized anxiety disorder - Plan: ALPRAZolam (XANAX) 0.5 MG tablet  Panic disorder with agoraphobia and moderate panic attacks  Restless leg syndrome, controlled  Insomnia due to mental condition   Patient has a long history of bipolar disorder and has failed multiple medications as noted above.  She is fairly stable with regard to her mood.  She has chronic residual anxiety and social avoidance stomach agoraphobia but not actively having panic attacks.  Overall doing pretty well.  She complains of a poor appetite but has not lost significant weight.  We discussed that this could be a side effect of topiramate.  She reduced the morning dose to 50 mg in the morning and keep 100 mg in the evening and see if that improves her appetite during the day.   Let us know if it causes any mood instability.  Disc the importance of protein to maintain health mentally and otherwise.  Rare use of Xanax.  Restless legs is managed fairly well most of the time with reduced ropinirole 0.5 mg q 5 and HS  Disc possibility of counseling. Disc mangement of leg cramps.  No other med change indicated today Plan: No med changes Continue lamotrigine 100 mg twice daily Continue alprazolam 0.5 mg daily as needed, used infrequently, Continue ropinirole 0.5 mg every 5 p.m. and at bedtime for restless legs Continue topiramate 50 mg every morning and 100 mg at 5 PM for anxiety and appetite control Continue trazodone 100 nightly  Follow-up 9-12 months  Meredith Staggersarey Cottle MD, DFAPA  Please  see After Visit Summary for patient specific instructions.  No future appointments.   No orders of the defined types were placed in this encounter.     -------------------------------

## 2022-05-22 DIAGNOSIS — M25552 Pain in left hip: Secondary | ICD-10-CM | POA: Diagnosis not present

## 2022-05-22 DIAGNOSIS — M25551 Pain in right hip: Secondary | ICD-10-CM | POA: Diagnosis not present

## 2022-05-30 ENCOUNTER — Other Ambulatory Visit: Payer: Self-pay | Admitting: Psychiatry

## 2022-05-30 DIAGNOSIS — F5105 Insomnia due to other mental disorder: Secondary | ICD-10-CM

## 2022-05-30 DIAGNOSIS — G2581 Restless legs syndrome: Secondary | ICD-10-CM

## 2022-05-30 DIAGNOSIS — F4001 Agoraphobia with panic disorder: Secondary | ICD-10-CM

## 2022-05-30 DIAGNOSIS — F411 Generalized anxiety disorder: Secondary | ICD-10-CM

## 2022-06-01 ENCOUNTER — Other Ambulatory Visit: Payer: Self-pay | Admitting: Psychiatry

## 2022-06-01 DIAGNOSIS — F3181 Bipolar II disorder: Secondary | ICD-10-CM

## 2022-07-05 ENCOUNTER — Other Ambulatory Visit (HOSPITAL_COMMUNITY): Payer: Self-pay | Admitting: Family Medicine

## 2022-07-05 DIAGNOSIS — M4135 Thoracogenic scoliosis, thoracolumbar region: Secondary | ICD-10-CM | POA: Diagnosis not present

## 2022-07-05 DIAGNOSIS — Z6836 Body mass index (BMI) 36.0-36.9, adult: Secondary | ICD-10-CM | POA: Diagnosis not present

## 2022-07-05 DIAGNOSIS — M25551 Pain in right hip: Secondary | ICD-10-CM | POA: Diagnosis not present

## 2022-07-05 DIAGNOSIS — R69 Illness, unspecified: Secondary | ICD-10-CM | POA: Diagnosis not present

## 2022-07-05 DIAGNOSIS — J4520 Mild intermittent asthma, uncomplicated: Secondary | ICD-10-CM | POA: Diagnosis not present

## 2022-07-05 DIAGNOSIS — E6609 Other obesity due to excess calories: Secondary | ICD-10-CM | POA: Diagnosis not present

## 2022-07-05 DIAGNOSIS — R252 Cramp and spasm: Secondary | ICD-10-CM | POA: Diagnosis not present

## 2022-07-08 DIAGNOSIS — M5417 Radiculopathy, lumbosacral region: Secondary | ICD-10-CM | POA: Diagnosis not present

## 2022-07-08 DIAGNOSIS — K5903 Drug induced constipation: Secondary | ICD-10-CM | POA: Diagnosis not present

## 2022-07-08 DIAGNOSIS — M48061 Spinal stenosis, lumbar region without neurogenic claudication: Secondary | ICD-10-CM | POA: Diagnosis not present

## 2022-07-08 DIAGNOSIS — G894 Chronic pain syndrome: Secondary | ICD-10-CM | POA: Diagnosis not present

## 2022-07-16 ENCOUNTER — Ambulatory Visit (HOSPITAL_COMMUNITY)
Admission: RE | Admit: 2022-07-16 | Discharge: 2022-07-16 | Disposition: A | Discharge status: Home / Self Care | Payer: Medicare HMO | Source: Ambulatory Visit | Attending: Family Medicine | Admitting: Family Medicine

## 2022-07-16 DIAGNOSIS — Z780 Asymptomatic menopausal state: Secondary | ICD-10-CM | POA: Insufficient documentation

## 2022-07-16 DIAGNOSIS — M85831 Other specified disorders of bone density and structure, right forearm: Secondary | ICD-10-CM | POA: Insufficient documentation

## 2022-07-16 DIAGNOSIS — Z13820 Encounter for screening for osteoporosis: Secondary | ICD-10-CM | POA: Diagnosis not present

## 2022-07-16 DIAGNOSIS — M4135 Thoracogenic scoliosis, thoracolumbar region: Secondary | ICD-10-CM | POA: Diagnosis not present

## 2022-07-22 DIAGNOSIS — M48061 Spinal stenosis, lumbar region without neurogenic claudication: Secondary | ICD-10-CM | POA: Diagnosis not present

## 2022-07-25 DIAGNOSIS — M48061 Spinal stenosis, lumbar region without neurogenic claudication: Secondary | ICD-10-CM | POA: Diagnosis not present

## 2022-07-30 DIAGNOSIS — M48061 Spinal stenosis, lumbar region without neurogenic claudication: Secondary | ICD-10-CM | POA: Diagnosis not present

## 2022-08-02 DIAGNOSIS — M5430 Sciatica, unspecified side: Secondary | ICD-10-CM | POA: Diagnosis not present

## 2022-08-02 DIAGNOSIS — R69 Illness, unspecified: Secondary | ICD-10-CM | POA: Diagnosis not present

## 2022-08-02 DIAGNOSIS — G2581 Restless legs syndrome: Secondary | ICD-10-CM | POA: Diagnosis not present

## 2022-08-02 DIAGNOSIS — Z87891 Personal history of nicotine dependence: Secondary | ICD-10-CM | POA: Diagnosis not present

## 2022-08-02 DIAGNOSIS — Z79891 Long term (current) use of opiate analgesic: Secondary | ICD-10-CM | POA: Diagnosis not present

## 2022-08-02 DIAGNOSIS — I10 Essential (primary) hypertension: Secondary | ICD-10-CM | POA: Diagnosis not present

## 2022-08-02 DIAGNOSIS — F411 Generalized anxiety disorder: Secondary | ICD-10-CM | POA: Diagnosis not present

## 2022-08-02 DIAGNOSIS — Z8249 Family history of ischemic heart disease and other diseases of the circulatory system: Secondary | ICD-10-CM | POA: Diagnosis not present

## 2022-08-02 DIAGNOSIS — M1990 Unspecified osteoarthritis, unspecified site: Secondary | ICD-10-CM | POA: Diagnosis not present

## 2022-08-02 DIAGNOSIS — R42 Dizziness and giddiness: Secondary | ICD-10-CM | POA: Diagnosis not present

## 2022-08-02 DIAGNOSIS — J45909 Unspecified asthma, uncomplicated: Secondary | ICD-10-CM | POA: Diagnosis not present

## 2022-08-02 DIAGNOSIS — K219 Gastro-esophageal reflux disease without esophagitis: Secondary | ICD-10-CM | POA: Diagnosis not present

## 2022-08-14 DIAGNOSIS — M48061 Spinal stenosis, lumbar region without neurogenic claudication: Secondary | ICD-10-CM | POA: Diagnosis not present

## 2022-08-30 ENCOUNTER — Other Ambulatory Visit: Payer: Self-pay | Admitting: Psychiatry

## 2022-08-30 DIAGNOSIS — F5105 Insomnia due to other mental disorder: Secondary | ICD-10-CM

## 2022-09-06 ENCOUNTER — Other Ambulatory Visit: Payer: Self-pay | Admitting: Psychiatry

## 2022-09-06 DIAGNOSIS — G2581 Restless legs syndrome: Secondary | ICD-10-CM

## 2022-09-06 DIAGNOSIS — F411 Generalized anxiety disorder: Secondary | ICD-10-CM

## 2022-09-06 DIAGNOSIS — F4001 Agoraphobia with panic disorder: Secondary | ICD-10-CM

## 2022-09-24 DIAGNOSIS — M5459 Other low back pain: Secondary | ICD-10-CM | POA: Diagnosis not present

## 2022-09-24 DIAGNOSIS — M5416 Radiculopathy, lumbar region: Secondary | ICD-10-CM | POA: Diagnosis not present

## 2022-09-24 DIAGNOSIS — M48061 Spinal stenosis, lumbar region without neurogenic claudication: Secondary | ICD-10-CM | POA: Diagnosis not present

## 2022-09-24 DIAGNOSIS — M5417 Radiculopathy, lumbosacral region: Secondary | ICD-10-CM | POA: Diagnosis not present

## 2022-09-24 DIAGNOSIS — G894 Chronic pain syndrome: Secondary | ICD-10-CM | POA: Diagnosis not present

## 2022-09-25 DIAGNOSIS — K219 Gastro-esophageal reflux disease without esophagitis: Secondary | ICD-10-CM | POA: Diagnosis not present

## 2022-09-25 DIAGNOSIS — I10 Essential (primary) hypertension: Secondary | ICD-10-CM | POA: Diagnosis not present

## 2022-10-23 ENCOUNTER — Other Ambulatory Visit: Payer: Self-pay | Admitting: Family Medicine

## 2022-10-23 DIAGNOSIS — Z1231 Encounter for screening mammogram for malignant neoplasm of breast: Secondary | ICD-10-CM

## 2022-10-24 DIAGNOSIS — I10 Essential (primary) hypertension: Secondary | ICD-10-CM | POA: Diagnosis not present

## 2022-10-24 DIAGNOSIS — K219 Gastro-esophageal reflux disease without esophagitis: Secondary | ICD-10-CM | POA: Diagnosis not present

## 2022-10-31 DIAGNOSIS — Z6835 Body mass index (BMI) 35.0-35.9, adult: Secondary | ICD-10-CM | POA: Diagnosis not present

## 2022-10-31 DIAGNOSIS — E782 Mixed hyperlipidemia: Secondary | ICD-10-CM | POA: Diagnosis not present

## 2022-10-31 DIAGNOSIS — Z0001 Encounter for general adult medical examination with abnormal findings: Secondary | ICD-10-CM | POA: Diagnosis not present

## 2022-10-31 DIAGNOSIS — Z1331 Encounter for screening for depression: Secondary | ICD-10-CM | POA: Diagnosis not present

## 2022-10-31 DIAGNOSIS — M4135 Thoracogenic scoliosis, thoracolumbar region: Secondary | ICD-10-CM | POA: Diagnosis not present

## 2022-10-31 DIAGNOSIS — E6609 Other obesity due to excess calories: Secondary | ICD-10-CM | POA: Diagnosis not present

## 2022-10-31 DIAGNOSIS — J4520 Mild intermittent asthma, uncomplicated: Secondary | ICD-10-CM | POA: Diagnosis not present

## 2022-10-31 DIAGNOSIS — G2581 Restless legs syndrome: Secondary | ICD-10-CM | POA: Diagnosis not present

## 2022-10-31 DIAGNOSIS — M519 Unspecified thoracic, thoracolumbar and lumbosacral intervertebral disc disorder: Secondary | ICD-10-CM | POA: Diagnosis not present

## 2022-11-01 ENCOUNTER — Ambulatory Visit
Admission: RE | Admit: 2022-11-01 | Discharge: 2022-11-01 | Disposition: A | Discharge status: Home / Self Care | Payer: Medicare HMO | Source: Ambulatory Visit | Attending: Family Medicine | Admitting: Family Medicine

## 2022-11-01 DIAGNOSIS — Z1231 Encounter for screening mammogram for malignant neoplasm of breast: Secondary | ICD-10-CM

## 2022-11-07 DIAGNOSIS — M5416 Radiculopathy, lumbar region: Secondary | ICD-10-CM | POA: Diagnosis not present

## 2022-11-07 DIAGNOSIS — M419 Scoliosis, unspecified: Secondary | ICD-10-CM | POA: Diagnosis not present

## 2022-11-11 ENCOUNTER — Other Ambulatory Visit: Payer: Self-pay | Admitting: Orthopaedic Surgery

## 2022-11-11 DIAGNOSIS — M419 Scoliosis, unspecified: Secondary | ICD-10-CM

## 2022-11-28 ENCOUNTER — Other Ambulatory Visit: Payer: Self-pay | Admitting: Psychiatry

## 2022-11-28 DIAGNOSIS — F4001 Agoraphobia with panic disorder: Secondary | ICD-10-CM

## 2022-11-28 DIAGNOSIS — F411 Generalized anxiety disorder: Secondary | ICD-10-CM

## 2022-11-28 DIAGNOSIS — G2581 Restless legs syndrome: Secondary | ICD-10-CM

## 2022-12-24 DIAGNOSIS — G894 Chronic pain syndrome: Secondary | ICD-10-CM | POA: Diagnosis not present

## 2022-12-24 DIAGNOSIS — K219 Gastro-esophageal reflux disease without esophagitis: Secondary | ICD-10-CM | POA: Diagnosis not present

## 2022-12-24 DIAGNOSIS — I10 Essential (primary) hypertension: Secondary | ICD-10-CM | POA: Diagnosis not present

## 2023-01-21 DIAGNOSIS — Z87891 Personal history of nicotine dependence: Secondary | ICD-10-CM | POA: Diagnosis not present

## 2023-01-21 DIAGNOSIS — M5430 Sciatica, unspecified side: Secondary | ICD-10-CM | POA: Diagnosis not present

## 2023-01-21 DIAGNOSIS — N1831 Chronic kidney disease, stage 3a: Secondary | ICD-10-CM | POA: Diagnosis not present

## 2023-01-21 DIAGNOSIS — G4700 Insomnia, unspecified: Secondary | ICD-10-CM | POA: Diagnosis not present

## 2023-01-21 DIAGNOSIS — F411 Generalized anxiety disorder: Secondary | ICD-10-CM | POA: Diagnosis not present

## 2023-01-21 DIAGNOSIS — K219 Gastro-esophageal reflux disease without esophagitis: Secondary | ICD-10-CM | POA: Diagnosis not present

## 2023-01-21 DIAGNOSIS — Z008 Encounter for other general examination: Secondary | ICD-10-CM | POA: Diagnosis not present

## 2023-01-21 DIAGNOSIS — I129 Hypertensive chronic kidney disease with stage 1 through stage 4 chronic kidney disease, or unspecified chronic kidney disease: Secondary | ICD-10-CM | POA: Diagnosis not present

## 2023-01-21 DIAGNOSIS — M1990 Unspecified osteoarthritis, unspecified site: Secondary | ICD-10-CM | POA: Diagnosis not present

## 2023-01-21 DIAGNOSIS — K5900 Constipation, unspecified: Secondary | ICD-10-CM | POA: Diagnosis not present

## 2023-01-21 DIAGNOSIS — M810 Age-related osteoporosis without current pathological fracture: Secondary | ICD-10-CM | POA: Diagnosis not present

## 2023-01-21 DIAGNOSIS — Z8249 Family history of ischemic heart disease and other diseases of the circulatory system: Secondary | ICD-10-CM | POA: Diagnosis not present

## 2023-01-21 DIAGNOSIS — M4800 Spinal stenosis, site unspecified: Secondary | ICD-10-CM | POA: Diagnosis not present

## 2023-01-24 DIAGNOSIS — E782 Mixed hyperlipidemia: Secondary | ICD-10-CM | POA: Diagnosis not present

## 2023-01-24 DIAGNOSIS — K219 Gastro-esophageal reflux disease without esophagitis: Secondary | ICD-10-CM | POA: Diagnosis not present

## 2023-01-24 DIAGNOSIS — I10 Essential (primary) hypertension: Secondary | ICD-10-CM | POA: Diagnosis not present

## 2023-02-23 DIAGNOSIS — E782 Mixed hyperlipidemia: Secondary | ICD-10-CM | POA: Diagnosis not present

## 2023-02-23 DIAGNOSIS — I10 Essential (primary) hypertension: Secondary | ICD-10-CM | POA: Diagnosis not present

## 2023-02-23 DIAGNOSIS — K219 Gastro-esophageal reflux disease without esophagitis: Secondary | ICD-10-CM | POA: Diagnosis not present

## 2023-02-26 DIAGNOSIS — K219 Gastro-esophageal reflux disease without esophagitis: Secondary | ICD-10-CM | POA: Diagnosis not present

## 2023-02-26 DIAGNOSIS — E6609 Other obesity due to excess calories: Secondary | ICD-10-CM | POA: Diagnosis not present

## 2023-02-26 DIAGNOSIS — Z6835 Body mass index (BMI) 35.0-35.9, adult: Secondary | ICD-10-CM | POA: Diagnosis not present

## 2023-03-05 DIAGNOSIS — R6881 Early satiety: Secondary | ICD-10-CM | POA: Diagnosis not present

## 2023-03-05 DIAGNOSIS — R140 Abdominal distension (gaseous): Secondary | ICD-10-CM | POA: Diagnosis not present

## 2023-03-05 DIAGNOSIS — K5900 Constipation, unspecified: Secondary | ICD-10-CM | POA: Diagnosis not present

## 2023-03-05 DIAGNOSIS — K219 Gastro-esophageal reflux disease without esophagitis: Secondary | ICD-10-CM | POA: Diagnosis not present

## 2023-03-20 ENCOUNTER — Ambulatory Visit
Admission: RE | Admit: 2023-03-20 | Discharge: 2023-03-20 | Disposition: A | Discharge status: Home / Self Care | Payer: Medicare HMO | Source: Ambulatory Visit | Attending: Internal Medicine | Admitting: Internal Medicine

## 2023-03-20 VITALS — BP 143/82 | HR 100 | Temp 98.2°F | Resp 18

## 2023-03-20 DIAGNOSIS — L509 Urticaria, unspecified: Secondary | ICD-10-CM | POA: Diagnosis not present

## 2023-03-20 MED ORDER — CYPROHEPTADINE HCL 4 MG PO TABS: 4.0000 mg | ORAL_TABLET | Freq: Three times a day (TID) | ORAL | 0 refills | Status: DC | PRN

## 2023-03-20 MED ORDER — FAMOTIDINE 20 MG PO TABS: 20.0000 mg | ORAL_TABLET | Freq: Two times a day (BID) | ORAL | 0 refills | Status: DC

## 2023-03-20 NOTE — Discharge Instructions (Signed)
Stop taking the Benadryl while taking the Periactin for itching. Add the Pepcid as well to help with the hives

## 2023-03-20 NOTE — ED Triage Notes (Signed)
Pt reports she has a rash from her stomach to her neck x 1 day.    Did eat some soup that she did not know if it was any good.  Had abdominal pain States her breathing was becoming "labored" today.

## 2023-03-20 NOTE — ED Provider Notes (Signed)
[facility]    CSN: [id number] Arrival date & time: 03/20/23  1353      History   Chief Complaint Chief Complaint  Patient presents with   Rash    Also a stomach ache - Entered by patient    HPI Cheryl Hoover is a 67 y.o. female who presents with rash from her abdomen to her neck since yesterday. She did eat some kind of soul  Had abdominal pain after she ate left over soup from [employer] which she had in the past, so was not something new. The pain resolved. Has not had vomiting or diarrhea. Denies tongue swelling, sensation of throat swelling or SOB. She has been taking Benadryl and Claritin and has helped. Today is better.     Past Medical History:  Diagnosis Date   Allergy    Anemia    Anxiety    Arthritis    Asthma    Bipolar disorder (HCC)    Complication of anesthesia    woke up during surgery    Depression    GERD (gastroesophageal reflux disease)    Hypertension    Scoliosis     Patient Active Problem List   Diagnosis Date Noted   Bipolar 2 disorder (HCC) 09/23/2018   Phobia 09/23/2018   Obese 02/04/2018   Overweight (BMI 25.0-29.9) 06/04/2017   S/P right THA, AA 06/03/2017   Lumbosacral radiculopathy 04/26/2015   Degenerative lumbar spinal stenosis 04/26/2015   Sliding hiatal hernia, recurrent 09/16/2011   ANXIETY DEPRESSION 04/21/2009   ASTHMA, MILD 04/21/2009   GERD 04/21/2009   CONSTIPATION 04/21/2009   SCOLIOSIS 04/21/2009   EARLY SATIETY 04/21/2009   ABDOMINAL PAIN, EPIGASTRIC 04/21/2009   ANOREXIA NERVOSA, HX OF 04/21/2009   BULIMIA, HX OF 04/21/2009    Past Surgical History:  Procedure Laterality Date   ARTHRODESIS METATARSALPHALANGEAL JOINT (MTPJ) Right 12/07/2020   Procedure: Right hallux metatarsophalangeal joint arthrodesis and silver bunionectomy;  Surgeon: Toni Arthurs, MD;  Location: [facility];  Service: Orthopedics;  Laterality: Right;   BREAST BIOPSY Left 04/04/2021   c-sections  1983   CARPAL  TUNNEL RELEASE     CESAREAN SECTION  1982   corrective eye surgery     HAMMERTOE RECONSTRUCTION WITH WEIL OSTEOTOMY Right 12/07/2020   Procedure: 2-3 weil and hammertoe corrections;  Surgeon: Toni Arthurs, MD;  Location: [facility];  Service: Orthopedics;  Laterality: Right;   HERNIA REPAIR  04/2012   hiatial hernia   lap nissen w/paraesophageal hernia repari  05/21/2010   TONSILECTOMY, ADENOIDECTOMY, BILATERAL MYRINGOTOMY AND TUBES     patient denies tubes in ears    TONSILLECTOMY     TOTAL HIP ARTHROPLASTY Right 06/03/2017   Procedure: RIGHT TOTAL HIP ARTHROPLASTY ANTERIOR APPROACH;  Surgeon: Durene Romans, MD;  Location: [facility];  Service: Orthopedics;  Laterality: Right;  70 mins   TOTAL HIP ARTHROPLASTY Left 02/03/2018   Procedure: LEFT TOTAL HIP ARTHROPLASTY ANTERIOR APPROACH;  Surgeon: Durene Romans, MD;  Location: [facility];  Service: Orthopedics;  Laterality: Left;  70 mins    OB History   No obstetric history on file.      Home Medications    Prior to Admission medications   Medication Sig Start Date End Date Taking? Authorizing Provider  cyproheptadine (PERIACTIN) 4 MG tablet Take 1 tablet (4 mg total) by mouth 3 (three) times daily as needed for allergies. 03/20/23  Yes Rodriguez-Southworth, Nettie Elm, PA-C  famotidine (PEPCID) 20 MG tablet Take 1 tablet (20  mg total) by mouth 2 (two) times daily. 03/20/23  Yes Rodriguez-Southworth, Nettie Elm, PA-C  acetaminophen (TYLENOL) 500 MG tablet Take 2 tablets (1,000 mg total) by mouth every 8 (eight) hours. 02/03/18   Lanney Gins, PA-C  albuterol (VENTOLIN HFA) 108 (90 BASE) MCG/ACT inhaler Inhale 2 puffs into the lungs every 4 (four) hours as needed for wheezing. Patient taking differently: Inhale 2 puffs into the lungs every 4 (four) hours as needed for wheezing or shortness of breath. 05/16/14   [name], MD  ALPRAZolam Prudy Feeler) 0.5 MG tablet Take 1 tablet (0.5 mg total) by mouth daily as needed for anxiety or  sleep. 05/14/22   Cottle, Steva Ready., MD  docusate sodium (COLACE) 100 MG capsule Take 1 capsule (100 mg total) by mouth 2 (two) times daily. While taking narcotic pain medicine. 12/07/20   Jacinta Shoe, PA-C  fluticasone [facility]) 50 MCG/ACT nasal spray Place 1 spray into both nostrils 2 (two) times daily. 08/20/21   Particia Nearing, PA-C  HYDROcodone-acetaminophen (NORCO) 10-325 MG tablet TAKE 1 TABLET BY MOUTH 4 TIMES DAILY AS NEEDED 04/12/19   [provider]  lamoTRIgine (LAMICTAL) 100 MG tablet TAKE ONE TABLET BY MOUTH EVERY MORNING and TAKE ONE TABLET BY MOUTH EVERYDAY AT BEDTIME 06/03/22   Cottle, Steva Ready., MD  lisinopril (PRINIVIL,ZESTRIL) 20 MG tablet Take 20 mg by mouth daily.    [provider]  loratadine (CLARITIN) 10 MG tablet Take 10 mg by mouth at bedtime.     [provider]  magnesium 30 MG tablet Take 30 mg by mouth 2 (two) times daily.    [provider]  meloxicam (MOBIC) 15 MG tablet Take 15 mg by mouth daily. 03/20/19   [provider]  mometasone-formoterol (DULERA) 100-5 MCG/ACT AERO Inhale 2 puffs into the lungs 2 (two) times daily.    [provider]  montelukast (SINGULAIR) 10 MG tablet Take 1 tablet by mouth daily. Patient taking differently: Take 10 mg by mouth at bedtime. 12/15/14   Shade Flood, MD  Multiple Vitamins-Minerals [facility] COMPLETE PO) Take by mouth.    [provider]  pantoprazole (PROTONIX) 40 MG tablet Take 40 mg by mouth daily.    [provider]  promethazine-dextromethorphan (PROMETHAZINE-DM) 6.25-15 MG/5ML syrup Take 5 mLs by mouth 4 (four) times daily as needed. 08/20/21   Particia Nearing, PA-C  rOPINIRole (REQUIP) 0.5 MG tablet TAKE ONE TABLET BY MOUTH EVERY EVENING and TAKE ONE TABLET BY MOUTH EVERYDAY AT BEDTIME 11/28/22   Cottle, Steva Ready., MD  senna (SENOKOT) 8.6 MG TABS tablet Take 2 tablets (17.2 mg total) by mouth 2 (two) times daily. 12/07/20    Jacinta Shoe, PA-C  topiramate (TOPAMAX) 50 MG tablet TAKE ONE TABLET BY MOUTH EVERY MORNING and TAKE TWO TABLETS BY MOUTH EVERY EVENING 11/28/22   Cottle, Steva Ready., MD  traZODone (DESYREL) 100 MG tablet TAKE ONE TABLET BY MOUTH EVERYDAY AT BEDTIME 08/30/22   Cottle, Steva Ready., MD  Vitamins A & D (VITAMIN A & D) ointment Apply 1 application topically as needed for dry skin.    [provider]  Omeprazole (PRILOSEC PO) Take 20.6 mg by mouth daily.  01/17/12  [provider]    Family History Family History  Problem Relation Age of Onset   Hypertension Mother        Deceased   Anxiety disorder Mother    Parkinson's disease Father  Deceased   Anxiety disorder Father    Anxiety disorder Brother    Anxiety disorder Son    Diabetes Son        Deceased, 54   Healthy Son     Social History Social History   Tobacco Use   Smoking status: Former    Current packs/day: 0.00    Types: Cigarettes    Quit date: 08/26/2008    Years since quitting: 14.5   Smokeless tobacco: Never  Vaping Use   Vaping status: Never Used  Substance Use Topics   Alcohol use: No    Alcohol/week: 0.0 standard drinks of alcohol    Comment: hx of    Drug use: Yes    Comment: hx of years ago - marijuana      Allergies   Avocado and Penicillins   Review of Systems Review of Systems  HENT:  Negative for trouble swallowing.   Respiratory:  Negative for chest tightness and shortness of breath.   Skin:  Positive for rash. Negative for wound.       + pruritus     Physical Exam Triage Vital Signs ED Triage Vitals  Encounter Vitals Group     BP 03/20/23 1358 (!) 143/82     Systolic BP Percentile --      Diastolic BP Percentile --      Pulse Rate 03/20/23 1358 100     Resp 03/20/23 1358 18     Temp 03/20/23 1358 98.2 F (36.8 C)     Temp Source 03/20/23 1358 Oral     SpO2 03/20/23 1358 92 %     Weight --      Height --      Head Circumference --      Peak Flow --       Pain Score 03/20/23 1359 0     Pain Loc --      Pain Education --      Exclude from Growth Chart --    No data found.  Updated Vital Signs BP (!) 143/82 (BP Location: Right Arm)   Pulse 100   Temp 98.2 F (36.8 C) (Oral)   Resp 18   SpO2 92%   Visual Acuity Right Eye Distance:   Left Eye Distance:   Bilateral Distance:    Right Eye Near:   Left Eye Near:    Bilateral Near:     Physical Exam Vitals and nursing note reviewed.  Constitutional:      General: She is not in acute distress.    Appearance: She is obese. She is not toxic-appearing.  Eyes:     General: No scleral icterus.    Conjunctiva/sclera: Conjunctivae normal.  Pulmonary:     Effort: Pulmonary effort is normal.  Musculoskeletal:        General: Normal range of motion.     Cervical back: Neck supple.  Skin:    General: Skin is warm and dry.     Findings: Rash present.     Comments: Has hives all over her back  and mild on neck and arms. None of face.   Neurological:     Mental Status: She is alert and oriented to person, place, and time.     Gait: Gait normal.  Psychiatric:        Mood and Affect: Mood normal.        Behavior: Behavior normal.        Thought Content: Thought content normal.  Judgment: Judgment normal.      UC Treatments / Results  Labs (all labs ordered are listed, but only abnormal results are displayed) Labs Reviewed - No data to display  EKG   Radiology No results found.  Procedures Procedures (including critical care time)  Medications Ordered in UC Medications - No data to display  Initial Impression / Assessment and Plan / UC Course  I have reviewed the triage vital signs and the nursing notes.  Urticaria  She was placed on Periactin and Pepcid as noted. She declined Prednisone since she can't tolerate this.    Final Clinical Impressions(s) / UC Diagnoses   Final diagnoses:  Hives     Discharge Instructions      Stop taking the Benadryl  while taking the Periactin for itching. Add the Pepcid as well to help with the hives      ED Prescriptions     Medication Sig Dispense Auth. Provider   famotidine (PEPCID) 20 MG tablet Take 1 tablet (20 mg total) by mouth 2 (two) times daily. 30 tablet Rodriguez-Southworth, Nettie Elm, PA-C   cyproheptadine (PERIACTIN) 4 MG tablet Take 1 tablet (4 mg total) by mouth 3 (three) times daily as needed for allergies. 30 tablet Rodriguez-Southworth, Nettie Elm, PA-C      PDMP not reviewed this encounter.   Garey Ham, PA-C 03/20/23 1601

## 2023-04-14 DIAGNOSIS — K219 Gastro-esophageal reflux disease without esophagitis: Secondary | ICD-10-CM | POA: Diagnosis not present

## 2023-04-14 DIAGNOSIS — K5900 Constipation, unspecified: Secondary | ICD-10-CM | POA: Diagnosis not present

## 2023-04-22 ENCOUNTER — Other Ambulatory Visit: Payer: Self-pay | Admitting: Psychiatry

## 2023-04-22 DIAGNOSIS — F411 Generalized anxiety disorder: Secondary | ICD-10-CM

## 2023-04-24 DIAGNOSIS — M5416 Radiculopathy, lumbar region: Secondary | ICD-10-CM | POA: Diagnosis not present

## 2023-04-24 DIAGNOSIS — M25551 Pain in right hip: Secondary | ICD-10-CM | POA: Diagnosis not present

## 2023-04-24 DIAGNOSIS — M48061 Spinal stenosis, lumbar region without neurogenic claudication: Secondary | ICD-10-CM | POA: Diagnosis not present

## 2023-04-24 DIAGNOSIS — M5459 Other low back pain: Secondary | ICD-10-CM | POA: Diagnosis not present

## 2023-04-24 DIAGNOSIS — Z5181 Encounter for therapeutic drug level monitoring: Secondary | ICD-10-CM | POA: Diagnosis not present

## 2023-04-24 DIAGNOSIS — G894 Chronic pain syndrome: Secondary | ICD-10-CM | POA: Diagnosis not present

## 2023-04-24 DIAGNOSIS — M5417 Radiculopathy, lumbosacral region: Secondary | ICD-10-CM | POA: Diagnosis not present

## 2023-04-24 DIAGNOSIS — Z79899 Other long term (current) drug therapy: Secondary | ICD-10-CM | POA: Diagnosis not present

## 2023-05-02 ENCOUNTER — Other Ambulatory Visit: Payer: Self-pay | Admitting: Psychiatry

## 2023-05-02 DIAGNOSIS — F5105 Insomnia due to other mental disorder: Secondary | ICD-10-CM

## 2023-05-02 DIAGNOSIS — F3181 Bipolar II disorder: Secondary | ICD-10-CM

## 2023-05-05 DIAGNOSIS — H04123 Dry eye syndrome of bilateral lacrimal glands: Secondary | ICD-10-CM | POA: Diagnosis not present

## 2023-05-15 ENCOUNTER — Encounter: Payer: Self-pay | Admitting: Psychiatry

## 2023-05-15 ENCOUNTER — Ambulatory Visit (INDEPENDENT_AMBULATORY_CARE_PROVIDER_SITE_OTHER): Payer: Medicare HMO | Admitting: Psychiatry

## 2023-05-15 DIAGNOSIS — F5105 Insomnia due to other mental disorder: Secondary | ICD-10-CM

## 2023-05-15 DIAGNOSIS — F4001 Agoraphobia with panic disorder: Secondary | ICD-10-CM | POA: Diagnosis not present

## 2023-05-15 DIAGNOSIS — F3181 Bipolar II disorder: Secondary | ICD-10-CM

## 2023-05-15 DIAGNOSIS — F411 Generalized anxiety disorder: Secondary | ICD-10-CM

## 2023-05-15 DIAGNOSIS — G2581 Restless legs syndrome: Secondary | ICD-10-CM | POA: Diagnosis not present

## 2023-05-15 DIAGNOSIS — R413 Other amnesia: Secondary | ICD-10-CM | POA: Diagnosis not present

## 2023-05-15 MED ORDER — TRAZODONE HCL 100 MG PO TABS: 100.0000 mg | ORAL_TABLET | Freq: Every day | ORAL | 0 refills | Status: DC

## 2023-05-15 MED ORDER — LAMOTRIGINE 100 MG PO TABS: ORAL_TABLET | ORAL | 0 refills | Status: DC

## 2023-05-15 MED ORDER — TOPIRAMATE 50 MG PO TABS: ORAL_TABLET | ORAL | 2 refills | Status: DC

## 2023-05-15 MED ORDER — ROPINIROLE HCL 0.5 MG PO TABS: ORAL_TABLET | ORAL | 2 refills | Status: DC

## 2023-05-15 NOTE — Progress Notes (Signed)
Cheryl Hoover [id number] May 10, 1956 67 y.o.   Subjective:   Patient ID:  Cheryl Hoover is a 67 y.o. (DOB 1956-02-05) female.  Chief Complaint:  Chief Complaint  Patient presents with   Follow-up    Mood and anxiety    HPI Cheryl Hoover presents to the office today for follow-up of bipolar and anxiety.   seen April 2020.  Topiramate was reduced to 50 mg in the morning and 100 mg in the evening.  No other meds were changed.  seen October 2020.  The following was noted: Forgot to reduce the topiramate and has continued 100 mg BID.  12/21/19 appt noted the following without med changes except as noted: Kind of down in the dumps.  Doesn't like being at home so much.  Not sure of the cause.  Things are a lot different for [place] bc so isolated and doing little.  Like to travel and can't now.  But did drive across the country and it was fun but not every day is like that.  Still tend to be short-tempered but Ok overall.  Low appetite.  Likes snacks.  Eats supper. Question if med related. Sedentary.  Doesn't know how to lose weight.  Weight stable Rare Xanax.  Doesn't like to drive but will do it. The only med change today is to decrease topiramate 100 mg tablets  To one half every morning and 1 every afternoon.  She didn't do this last time and appetite is still poor.   December 21, 2019 appointment, the following is noted: Compliant with meds. Wants change in clonazepam.  Tried to reduce to 1 1/2 tablets at night and couldn't sleep.  Currently sleeping well.  Except for bad dreams for 6 mos.  Usually don't awaken her at night.  Spouse woke her recently bc of talking in bad dream.Spke with clinical pharmacist who suggested trying to stop.    No SE.   For awhile appetite was better but still don't eat well.  No desire for food daytime until night and poor choices longterm.  No weight changes.  Little depression.  Not bad. Plan: The only med change today is to decrease topiramate 100 mg  tablets  To one half every morning and 1 every afternoon.  She didn't do this last time and appetite is still poor.   09/06/20 appt with following noted: Rarely takes alprazolam. Stopped clonazepam and takes 100 mg trazodone and sleeps well.  Clonazepam hard to stop with WD and reduced later by 1/4 tablet every 2 weeks and took a couple of mos to get used to the change. Plan no changes.  08/16/21 appt noted: Pretty much ok since here.  Only bothered by family issues.   Satisfied with meds.  No SE problem except PM lamotrigine makes her sleepy.   Wants ropinirole written for 0.5 mg 1 at 5 and 1 at bedtime and trazodone written for 100 mg HS. Tried video therapist with young female therapist and didn't work out. Plan: No med changes Continue lamotrigine 100 mg twice daily Continue alprazolam 0.5 mg daily as needed, used infrequently, Continue ropinirole 0.5 mg every 5 p.m. and at bedtime for restless legs Continue topiramate 50 mg every morning and 100 mg at 5 PM for anxiety and appetite control Continue trazodone 100 nightly  05/14/2022 appointment noted: Alright I guess.  Fine with meds.  Not as cheerful as I could be.   Stress with kids.    RLS and sleep managed with  meds.  Occ leg cramps. Upstream pharmacy bubble packs bc was mixing up topiramate and ropinirole.  Consistent with meds. No SE. Trazodone really helped. Rare panic.  A few phobias like plane being trapped in a small space and dentist. Plan no changes  05/15/23 appt noted: Meds as above No SE.   Some concern over meds and aging.  Even keel for now. Incredibly bored is main problem.  No sig friends nor family contact.  Watches a lot of TV. Some downhill DT health.  Not interested in exercise.  Scoliosis with back pain.inactive bc of it.   Likes to [employer] and sew.  Reads and listens to books.   Patient reports stable mood and denies or irritable moods.  Patient reports recent difficulty with anxiety mainly claustrophobia.. Occ  bouts of feeling on edge of anxiety, speeded up but not really manic and not severe.    Patient denies difficulty with sleep initiation or maintenance. Poor appetite disturbance chronically without change.  Patient reports that energy and motivation have been chronically poor.  Patient denies any difficulty with concentration, but some issues with memory.  Patient denies any suicidal ideation.  No panic lately. No mania.  RLS managed. Used to overeat but not anymore.  Struggles with undereating.    Past Psychiatric Medication Trials: Wellbutrin, Pristiq, Nardil, Prozac, Paxil hypomania, Zoloft, imipramine, desipramine, Wellbutrin, Serzone 600 mg, buspirone, clonazepam,  topiramate 100 mg twice daily, lamotrigine 100 mg twice daily, carbamazepine,  Saphris, gabapentin,  Latuda,   ropinirole,   Trazodone helped  Review of Systems:  Review of Systems  Constitutional:  Negative for appetite change and unexpected weight change.  HENT:  Positive for tinnitus.   Cardiovascular:  Negative for palpitations.  Musculoskeletal:  Positive for arthralgias and gait problem.  Neurological:  Negative for tremors and headaches.    Medications: I have reviewed the patient's current medications.  Current Outpatient Medications  Medication Sig Dispense Refill   acetaminophen (TYLENOL) 500 MG tablet Take 2 tablets (1,000 mg total) by mouth every 8 (eight) hours. 30 tablet 0   albuterol (VENTOLIN HFA) 108 (90 BASE) MCG/ACT inhaler Inhale 2 puffs into the lungs every 4 (four) hours as needed for wheezing. (Patient taking differently: Inhale 2 puffs into the lungs every 4 (four) hours as needed for wheezing or shortness of breath.) 18 g 6   ALPRAZolam (XANAX) 0.5 MG tablet Take 1 tablet (0.5 mg total) by mouth daily as needed for anxiety or sleep. 30 tablet 0   cyproheptadine (PERIACTIN) 4 MG tablet Take 1 tablet (4 mg total) by mouth 3 (three) times daily as needed for allergies. 30 tablet 0   docusate sodium  (COLACE) 100 MG capsule Take 1 capsule (100 mg total) by mouth 2 (two) times daily. While taking narcotic pain medicine. 30 capsule 0   esomeprazole (NEXIUM) 20 MG packet Take 20 mg by mouth daily before breakfast.     famotidine (PEPCID) 20 MG tablet Take 1 tablet (20 mg total) by mouth 2 (two) times daily. 30 tablet 0   fluticasone (FLONASE) 50 MCG/ACT nasal spray Place 1 spray into both nostrils 2 (two) times daily. 16 g 2   HYDROcodone-acetaminophen (NORCO) 10-325 MG tablet TAKE 1 TABLET BY MOUTH 4 TIMES DAILY AS NEEDED     lisinopril (PRINIVIL,ZESTRIL) 20 MG tablet Take 20 mg by mouth daily.     loratadine (CLARITIN) 10 MG tablet Take 10 mg by mouth at bedtime.      magnesium 30 MG tablet Take  30 mg by mouth 2 (two) times daily.     meloxicam (MOBIC) 15 MG tablet Take 15 mg by mouth daily.     mometasone-formoterol (DULERA) 100-5 MCG/ACT AERO Inhale 2 puffs into the lungs 2 (two) times daily.     montelukast (SINGULAIR) 10 MG tablet Take 1 tablet by mouth daily. (Patient taking differently: Take 10 mg by mouth at bedtime.) 90 tablet 1   Multiple Vitamins-Minerals (MULTI COMPLETE PO) Take by mouth.     promethazine-dextromethorphan (PROMETHAZINE-DM) 6.25-15 MG/5ML syrup Take 5 mLs by mouth 4 (four) times daily as needed. 100 mL 0   senna (SENOKOT) 8.6 MG TABS tablet Take 2 tablets (17.2 mg total) by mouth 2 (two) times daily. 30 tablet 0   Vitamins A & D (VITAMIN A & D) ointment Apply 1 application topically as needed for dry skin.     lamoTRIgine (LAMICTAL) 100 MG tablet TAKE 1 TABLET BY MOUTH EACH MORNING AND TAKE 1 TABLET EVERY DAY AT BEDTIME 180 tablet 0   pantoprazole (PROTONIX) 40 MG tablet Take 40 mg by mouth daily. (Patient not taking: Reported on 05/15/2023)     rOPINIRole (REQUIP) 0.5 MG tablet TAKE ONE TABLET BY MOUTH EVERY EVENING and TAKE ONE TABLET BY MOUTH EVERYDAY AT BEDTIME 180 tablet 2   topiramate (TOPAMAX) 50 MG tablet TAKE ONE TABLET BY MOUTH EVERY MORNING and TAKE TWO  TABLETS BY MOUTH EVERY EVENING 270 tablet 2   traZODone (DESYREL) 100 MG tablet Take 1 tablet (100 mg total) by mouth at bedtime. 90 tablet 0   No current facility-administered medications for this visit.    Medication Side Effects: Appetite Suppression and Other: ?  Allergies:  Allergies  Allergen Reactions   Avocado Itching   Penicillins Other (See Comments)    Unknown,childhood allergy  Has patient had a PCN reaction causing immediate rash, facial/tongue/throat swelling, SOB or lightheadedness with hypotension: Unknown Has patient had a PCN reaction causing severe rash involving mucus membranes or skin necrosis: Unknown Has patient had a PCN reaction that required hospitalization: Unknown Has patient had a PCN reaction occurring within the last 10 years: No If all of the above answers are "NO", then may proceed with Cephalosporin use.     Past Medical History:  Diagnosis Date   Allergy    Anemia    Anxiety    Arthritis    Asthma    Bipolar disorder (HCC)    Complication of anesthesia    woke up during surgery    Depression    GERD (gastroesophageal reflux disease)    Hypertension    Scoliosis     Family History  Problem Relation Age of Onset   Hypertension Mother        Deceased   Anxiety disorder Mother    Parkinson's disease Father        Deceased   Anxiety disorder Father    Anxiety disorder Brother    Anxiety disorder Son    Diabetes Son        Deceased, 52   Healthy Son     Social History   Socioeconomic History   Marital status: Married    Spouse name: Not on file   Number of children: 2   Years of education: Not on file   Highest education level: Not on file  Occupational History   Occupation: None  Tobacco Use   Smoking status: Former    Current packs/day: 0.00    Types: Cigarettes    Quit date:  08/26/2008    Years since quitting: 14.7   Smokeless tobacco: Never  Vaping Use   Vaping status: Never Used  Substance and Sexual Activity    Alcohol use: No    Alcohol/week: 0.0 standard drinks of alcohol    Comment: hx of    Drug use: Yes    Comment: hx of years ago - marijuana    Sexual activity: Not Currently  Other Topics Concern   Not on file  Social History Narrative   She was last working in March 2016 and laid off.  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   Highest level of education:  High school   She lives alone.     Social Determinants of Health   Financial Resource Strain: Not on file  Food Insecurity: Not on file  Transportation Needs: Not on file  Physical Activity: Not on file  Stress: Not on file  Social Connections: Not on file  Intimate Partner Violence: Not on file    Past Medical History, Surgical history, Social history, and Family history were reviewed and updated as appropriate.   Please see review of systems for further details on the patient's review from today.   Objective:   Physical Exam:  There were no vitals taken for this visit.  Physical Exam Exam conducted with a chaperone present.  Constitutional:      General: She is not in acute distress.    Appearance: She is well-developed.  Musculoskeletal:        General: No deformity.  Neurological:     Mental Status: She is alert and oriented to person, place, and time.     Cranial Nerves: No dysarthria.     Coordination: Coordination normal.  Psychiatric:        Attention and Perception: Attention and perception normal. She does not perceive auditory or visual hallucinations.        Mood and Affect: Mood is not anxious or depressed. Affect is not labile, angry or inappropriate.        Speech: Speech normal.        Behavior: Behavior normal. Behavior is cooperative.        Thought Content: Thought content normal. Thought content is not paranoid or delusional. Thought content does not include homicidal or suicidal ideation. Thought content does not include suicidal plan.        Cognition  and Memory: Cognition and memory normal.        Judgment: Judgment normal.     Comments:       Lab Review:     Component Value Date/Time   NA 138 07/06/2020 1116   K 4.0 07/06/2020 1116   CL 106 07/06/2020 1116   CO2 23 07/06/2020 1116   GLUCOSE 106 (H) 07/06/2020 1116   BUN 18 07/06/2020 1116   CREATININE 1.22 (H) 07/06/2020 1116   CREATININE 1.05 12/15/2014 1424   CALCIUM 9.3 07/06/2020 1116   PROT 6.1 12/15/2014 1424   ALBUMIN 4.0 12/15/2014 1424   AST 13 12/15/2014 1424   ALT 10 12/15/2014 1424   ALKPHOS 72 12/15/2014 1424   BILITOT 0.6 12/15/2014 1424   GFRNONAA 50 (L) 07/06/2020 1116   GFRNONAA 59 (L) 12/15/2014 1424   GFRAA >60 02/04/2018 0531   GFRAA 68 12/15/2014 1424       Component Value Date/Time   WBC 4.9 07/06/2020 1116   RBC 3.89 07/06/2020 1116   HGB 12.3 07/06/2020 1116  HCT 38.3 07/06/2020 1116   PLT 222 07/06/2020 1116   MCV 98.5 07/06/2020 1116   MCH 31.6 07/06/2020 1116   MCHC 32.1 07/06/2020 1116   RDW 12.8 07/06/2020 1116   LYMPHSABS 1.6 07/06/2020 1116   MONOABS 0.3 07/06/2020 1116   EOSABS 0.1 07/06/2020 1116   BASOSABS 0.0 07/06/2020 1116    No results found for: "POCLITH", "LITHIUM"   No results found for: "PHENYTOIN", "PHENOBARB", "VALPROATE", "CBMZ"   .res Assessment: Plan:    Bipolar II disorder (HCC) - Plan: B12 and Folate Panel, lamoTRIgine (LAMICTAL) 100 MG tablet  Generalized anxiety disorder - Plan: topiramate (TOPAMAX) 50 MG tablet  Panic disorder with agoraphobia and moderate panic attacks - Plan: topiramate (TOPAMAX) 50 MG tablet  Restless leg syndrome, controlled - Plan: rOPINIRole (REQUIP) 0.5 MG tablet  Insomnia due to mental condition - Plan: traZODone (DESYREL) 100 MG tablet  Memory loss - Plan: B12 and Folate Panel   Patient has a long history of bipolar disorder and has failed multiple medications as noted above.  She is fairly stable with regard to her mood.  She has chronic residual anxiety and social  avoidance stomach agoraphobia but not actively having panic attacks.  Overall doing pretty well.  She complains of a poor appetite but has not lost significant weight.  We discussed that this could be a side effect of topiramate.  She reduced the morning dose to 50 mg in the morning and keep 100 mg in the evening and see if that improves her appetite during the day.   Let us know if it causes any mood instability.  Disc the importance of protein to maintain health mentally and otherwise.  Rare use of Xanax.  Restless legs is managed fairly well most of the time with reduced ropinirole 0.5 mg q 5 and HS  Disc possibility of counseling. Disc mangement of leg cramps. Encourage SR citizen but she's socially avoidant.  No other med change indicated today Plan: No med changes Continue lamotrigine 100 mg twice daily Continue alprazolam 0.5 mg daily as needed, used infrequently, Continue ropinirole 0.5 mg every 5 p.m. and at bedtime for restless legs Continue topiramate 50 mg every morning and 100 mg at 5 PM for anxiety and appetite control Continue trazodone 100 nightly  Check B12 and folate DT memory concerns.  Being on AED and reflux meds and increases risk.  Follow-up 9-12 months  Meredith Staggers MD, DFAPA  Please see After Visit Summary for patient specific instructions.  No future appointments.   Orders Placed This Encounter  Procedures   B12 and Folate Panel      -------------------------------

## 2023-06-17 DIAGNOSIS — F3181 Bipolar II disorder: Secondary | ICD-10-CM | POA: Diagnosis not present

## 2023-06-17 DIAGNOSIS — R413 Other amnesia: Secondary | ICD-10-CM | POA: Diagnosis not present

## 2023-06-18 LAB — B12 AND FOLATE PANEL
Folate: >20 ng/mL (ref >3.0)
Vitamin B-12: 1783 pg/mL — ABNORMAL HIGH (ref 232–1245)

## 2023-06-20 ENCOUNTER — Telehealth: Payer: Self-pay

## 2023-06-20 NOTE — Telephone Encounter (Signed)
You had ordered a B12 and folate level for patient. She saw results and wanted to know if she should be concerned about her B12 level being high. She takes a multivitamin and has a protein shake daily.       Component Ref Range & Units 3 d ago  Vitamin B-12 232 - 1,245 pg/mL 1,783 High   Folate >3.0 ng/mL >20.0

## 2023-06-20 NOTE — Telephone Encounter (Signed)
No sig concerns with B12 being a little high.  Her body will eliminate the extra it doesn't need.  If she is taking B12 supplement she can reduce it but doesn't have to.

## 2023-06-20 NOTE — Telephone Encounter (Signed)
Patient notified

## 2023-08-26 DIAGNOSIS — M5459 Other low back pain: Secondary | ICD-10-CM | POA: Diagnosis not present

## 2023-08-26 DIAGNOSIS — M48061 Spinal stenosis, lumbar region without neurogenic claudication: Secondary | ICD-10-CM | POA: Diagnosis not present

## 2023-08-26 DIAGNOSIS — M5416 Radiculopathy, lumbar region: Secondary | ICD-10-CM | POA: Diagnosis not present

## 2023-08-26 DIAGNOSIS — G894 Chronic pain syndrome: Secondary | ICD-10-CM | POA: Diagnosis not present

## 2023-10-01 DIAGNOSIS — F319 Bipolar disorder, unspecified: Secondary | ICD-10-CM | POA: Diagnosis not present

## 2023-10-01 DIAGNOSIS — J4489 Other specified chronic obstructive pulmonary disease: Secondary | ICD-10-CM | POA: Diagnosis not present

## 2023-10-01 DIAGNOSIS — Z809 Family history of malignant neoplasm, unspecified: Secondary | ICD-10-CM | POA: Diagnosis not present

## 2023-10-01 DIAGNOSIS — K219 Gastro-esophageal reflux disease without esophagitis: Secondary | ICD-10-CM | POA: Diagnosis not present

## 2023-10-01 DIAGNOSIS — I77819 Aortic ectasia, unspecified site: Secondary | ICD-10-CM | POA: Diagnosis not present

## 2023-10-01 DIAGNOSIS — Z8249 Family history of ischemic heart disease and other diseases of the circulatory system: Secondary | ICD-10-CM | POA: Diagnosis not present

## 2023-10-01 DIAGNOSIS — Z008 Encounter for other general examination: Secondary | ICD-10-CM | POA: Diagnosis not present

## 2023-10-01 DIAGNOSIS — N1831 Chronic kidney disease, stage 3a: Secondary | ICD-10-CM | POA: Diagnosis not present

## 2023-10-01 DIAGNOSIS — E785 Hyperlipidemia, unspecified: Secondary | ICD-10-CM | POA: Diagnosis not present

## 2023-10-01 DIAGNOSIS — M810 Age-related osteoporosis without current pathological fracture: Secondary | ICD-10-CM | POA: Diagnosis not present

## 2023-10-01 DIAGNOSIS — Z833 Family history of diabetes mellitus: Secondary | ICD-10-CM | POA: Diagnosis not present

## 2023-10-01 DIAGNOSIS — Z87891 Personal history of nicotine dependence: Secondary | ICD-10-CM | POA: Diagnosis not present

## 2023-10-20 ENCOUNTER — Other Ambulatory Visit: Payer: Self-pay | Admitting: Psychiatry

## 2023-10-20 DIAGNOSIS — F5105 Insomnia due to other mental disorder: Secondary | ICD-10-CM

## 2023-10-20 DIAGNOSIS — F3181 Bipolar II disorder: Secondary | ICD-10-CM

## 2023-11-04 DIAGNOSIS — E782 Mixed hyperlipidemia: Secondary | ICD-10-CM | POA: Diagnosis not present

## 2023-11-04 DIAGNOSIS — I10 Essential (primary) hypertension: Secondary | ICD-10-CM | POA: Diagnosis not present

## 2023-11-04 DIAGNOSIS — R7309 Other abnormal glucose: Secondary | ICD-10-CM | POA: Diagnosis not present

## 2023-11-04 DIAGNOSIS — Z1231 Encounter for screening mammogram for malignant neoplasm of breast: Secondary | ICD-10-CM | POA: Diagnosis not present

## 2023-11-05 ENCOUNTER — Other Ambulatory Visit: Payer: Self-pay | Admitting: Family Medicine

## 2023-11-05 DIAGNOSIS — Z1231 Encounter for screening mammogram for malignant neoplasm of breast: Secondary | ICD-10-CM

## 2023-11-07 ENCOUNTER — Ambulatory Visit
Admission: RE | Admit: 2023-11-07 | Discharge: 2023-11-07 | Disposition: A | Discharge status: Home / Self Care | Source: Ambulatory Visit | Attending: Family Medicine | Admitting: Family Medicine

## 2023-11-07 DIAGNOSIS — Z1231 Encounter for screening mammogram for malignant neoplasm of breast: Secondary | ICD-10-CM | POA: Diagnosis not present

## 2023-11-12 ENCOUNTER — Telehealth: Payer: Self-pay | Admitting: Psychiatry

## 2023-11-12 NOTE — Telephone Encounter (Signed)
 Pt called 10:00 to report PCP has blood work back. He may want to take a look at it

## 2023-11-13 ENCOUNTER — Encounter: Payer: Self-pay | Admitting: Psychiatry

## 2023-11-13 NOTE — Telephone Encounter (Signed)
 I don't see any labs in Epic or Care Everywhere. She had them done at [facility] thru [place]. Asked her to have them fax [place] results.

## 2023-12-30 DIAGNOSIS — M5459 Other low back pain: Secondary | ICD-10-CM | POA: Diagnosis not present

## 2023-12-30 DIAGNOSIS — G894 Chronic pain syndrome: Secondary | ICD-10-CM | POA: Diagnosis not present

## 2023-12-30 DIAGNOSIS — M25551 Pain in right hip: Secondary | ICD-10-CM | POA: Diagnosis not present

## 2023-12-30 DIAGNOSIS — Z9189 Other specified personal risk factors, not elsewhere classified: Secondary | ICD-10-CM | POA: Diagnosis not present

## 2023-12-30 DIAGNOSIS — M5416 Radiculopathy, lumbar region: Secondary | ICD-10-CM | POA: Diagnosis not present

## 2023-12-30 DIAGNOSIS — M48061 Spinal stenosis, lumbar region without neurogenic claudication: Secondary | ICD-10-CM | POA: Diagnosis not present

## 2023-12-30 DIAGNOSIS — M5417 Radiculopathy, lumbosacral region: Secondary | ICD-10-CM | POA: Diagnosis not present

## 2024-04-13 ENCOUNTER — Ambulatory Visit: Payer: Self-pay

## 2024-04-13 VITALS — BP 154/101 | HR 93 | Ht 63.0 in | Wt 201.0 lb

## 2024-04-13 DIAGNOSIS — M5417 Radiculopathy, lumbosacral region: Secondary | ICD-10-CM | POA: Diagnosis not present

## 2024-04-13 DIAGNOSIS — R519 Headache, unspecified: Secondary | ICD-10-CM | POA: Insufficient documentation

## 2024-04-13 DIAGNOSIS — I10 Essential (primary) hypertension: Secondary | ICD-10-CM

## 2024-04-13 MED ORDER — LISINOPRIL 40 MG PO TABS: 40.0000 mg | ORAL_TABLET | Freq: Every day | ORAL | 1 refills | Status: DC

## 2024-04-13 NOTE — Progress Notes (Addendum)
 New Patient Office Visit  Subjective    Patient ID: Cheryl Hoover, female    DOB: Sep 22, 1955  Age: 68 y.o. MRN: [id number]  CC:  Chief Complaint  Patient presents with   Establish Care    Pt here to establish care     HPI Cheryl Hoover presents to establish care  Discussed the use of AI scribe software for clinical note transcription with the patient, who gave verbal consent to proceed.  History of Present Illness    Cheryl Hoover is a 68 year old female who presents for establishment of care and management of her medical conditions.  Spinal deformity and chronic back pain - Advanced scoliosis with compressed intervertebral discs and spinal stenosis - Currently managed by pain management at [facility] - Monitoring for significant changes in symptoms  Hypertension - Currently taking lisinopril  20 mg daily - No home blood pressure monitoring - Blood pressure remains elevated - Family history of hypertension - Mother underwent open heart surgery      Outpatient Encounter Medications as of 04/13/2024  Medication Sig   acetaminophen  (TYLENOL ) 500 MG tablet Take 2 tablets (1,000 mg total) by mouth every 8 (eight) hours.   albuterol  (VENTOLIN  HFA) 108 (90 BASE) MCG/ACT inhaler Inhale 2 puffs into the lungs every 4 (four) hours as needed for wheezing. (Patient taking differently: Inhale 2 puffs into the lungs every 4 (four) hours as needed for wheezing or shortness of breath.)   ALPRAZolam  (XANAX ) 0.5 MG tablet Take 1 tablet (0.5 mg total) by mouth daily as needed for anxiety or sleep.   cyproheptadine  (PERIACTIN ) 4 MG tablet Take 1 tablet (4 mg total) by mouth 3 (three) times daily as needed for allergies.   docusate sodium  (COLACE) 100 MG capsule Take 1 capsule (100 mg total) by mouth 2 (two) times daily. While taking narcotic pain medicine.   fluticasone  (FLONASE ) 50 MCG/ACT nasal spray Place 1 spray into both nostrils 2 (two) times daily.    HYDROcodone -acetaminophen  (NORCO) 10-325 MG tablet TAKE 1 TABLET BY MOUTH 4 TIMES DAILY AS NEEDED   lamoTRIgine  (LAMICTAL ) 100 MG tablet TAKE 1 TABLET BY MOUTH EACH MORNING AND TAKE 1 TABLET EVERY DAY AT BEDTIME   loratadine  (CLARITIN ) 10 MG tablet Take 10 mg by mouth at bedtime.    meloxicam (MOBIC) 15 MG tablet Take 15 mg by mouth daily.   montelukast  (SINGULAIR ) 10 MG tablet Take 1 tablet by mouth daily. (Patient taking differently: Take 10 mg by mouth at bedtime.)   Multiple Vitamins-Minerals (MULTI COMPLETE PO) Take by mouth.   traZODone  (DESYREL ) 100 MG tablet TAKE 1 TABLET BY MOUTH AT BEDTIME   Vitamins A & D (VITAMIN A & D) ointment Apply 1 application topically as needed for dry skin.   [DISCONTINUED] lisinopril  (PRINIVIL ,ZESTRIL ) 20 MG tablet Take 20 mg by mouth daily.   [DISCONTINUED] rOPINIRole  (REQUIP ) 0.5 MG tablet TAKE ONE TABLET BY MOUTH EVERY EVENING and TAKE ONE TABLET BY MOUTH EVERYDAY AT BEDTIME   [DISCONTINUED] topiramate  (TOPAMAX ) 50 MG tablet TAKE ONE TABLET BY MOUTH EVERY MORNING and TAKE TWO TABLETS BY MOUTH EVERY EVENING   esomeprazole  (NEXIUM ) 20 MG packet Take 20 mg by mouth daily before breakfast.   [DISCONTINUED] famotidine  (PEPCID ) 20 MG tablet Take 1 tablet (20 mg total) by mouth 2 (two) times daily.   [DISCONTINUED] lisinopril  (ZESTRIL ) 40 MG tablet Take 1 tablet (40 mg total) by mouth daily.   [DISCONTINUED] magnesium  30 MG tablet Take 30 mg by mouth  2 (two) times daily.   [DISCONTINUED] mometasone -formoterol  (DULERA ) 100-5 MCG/ACT AERO Inhale 2 puffs into the lungs 2 (two) times daily.   [DISCONTINUED] Omeprazole  (PRILOSEC PO) Take 20.6 mg by mouth daily.   [DISCONTINUED] pantoprazole (PROTONIX) 40 MG tablet Take 40 mg by mouth daily. (Patient not taking: Reported on 05/15/2023)   [DISCONTINUED] promethazine -dextromethorphan (PROMETHAZINE -DM) 6.25-15 MG/5ML syrup Take 5 mLs by mouth 4 (four) times daily as needed.   [DISCONTINUED] senna (SENOKOT) 8.6 MG TABS  tablet Take 2 tablets (17.2 mg total) by mouth 2 (two) times daily.   No facility-administered encounter medications on file as of 04/13/2024.    Past Medical History:  Diagnosis Date   Allergy    Anemia    Anxiety    Arthritis    Asthma    Bipolar disorder (HCC)    Complication of anesthesia    woke up during surgery    Depression    GERD (gastroesophageal reflux disease)    Hypertension    Scoliosis     Past Surgical History:  Procedure Laterality Date   ARTHRODESIS METATARSALPHALANGEAL JOINT (MTPJ) Right 12/07/2020   Procedure: Right hallux metatarsophalangeal joint arthrodesis and silver bunionectomy;  Surgeon: Kit Rush, MD;  Location: [facility];  Service: Orthopedics;  Laterality: Right;   BREAST BIOPSY Left 04/04/2021   c-sections  1983   CARPAL TUNNEL RELEASE     CESAREAN SECTION  1982   corrective eye surgery     HAMMERTOE RECONSTRUCTION WITH WEIL OSTEOTOMY Right 12/07/2020   Procedure: 2-3 weil and hammertoe corrections;  Surgeon: Kit Rush, MD;  Location: [facility];  Service: Orthopedics;  Laterality: Right;   HERNIA REPAIR  04/2012   hiatial hernia   lap nissen w/paraesophageal hernia repari  05/21/2010   TONSILECTOMY, ADENOIDECTOMY, BILATERAL MYRINGOTOMY AND TUBES     patient denies tubes in ears    TONSILLECTOMY     TOTAL HIP ARTHROPLASTY Right 06/03/2017   Procedure: RIGHT TOTAL HIP ARTHROPLASTY ANTERIOR APPROACH;  Surgeon: Ernie Cough, MD;  Location: [facility];  Service: Orthopedics;  Laterality: Right;  70 mins   TOTAL HIP ARTHROPLASTY Left 02/03/2018   Procedure: LEFT TOTAL HIP ARTHROPLASTY ANTERIOR APPROACH;  Surgeon: Ernie Cough, MD;  Location: [facility];  Service: Orthopedics;  Laterality: Left;  70 mins    Family History  Problem Relation Age of Onset   Hypertension Mother        Deceased   Anxiety disorder Mother    Parkinson's disease Father        Deceased   Anxiety disorder Father    Anxiety disorder  Brother    Anxiety disorder Son    Diabetes Son        Deceased, 35   Healthy Son     Social History   Socioeconomic History   Marital status: Married    Spouse name: Not on file   Number of children: 2   Years of education: Not on file   Highest education level: 12th grade  Occupational History   Occupation: None  Tobacco Use   Smoking status: Former    Current packs/day: 0.00    Types: Cigarettes    Quit date: 08/26/2008    Years since quitting: 15.6   Smokeless tobacco: Never  Vaping Use   Vaping status: Never Used  Substance and Sexual Activity   Alcohol use: No    Alcohol/week: 0.0 standard drinks of alcohol    Comment: hx of    Drug use: Yes  Comment: hx of years ago - marijuana    Sexual activity: Not Currently  Other Topics Concern   Not on file  Social History Narrative   She was last working in March 2016 and laid off.  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   Highest level of education:  High school   She lives alone.     Social Drivers of Corporate investment banker Strain: Low Risk  (04/13/2024)   Overall Financial Resource Strain (CARDIA)    Difficulty of Paying Living Expenses: Not hard at all  Food Insecurity: No Food Insecurity (04/13/2024)   Hunger Vital Sign    Worried About Running Out of Food in the Last Year: Never true    Ran Out of Food in the Last Year: Never true  Transportation Needs: No Transportation Needs (04/13/2024)   PRAPARE - Administrator, Civil Service (Medical): No    Lack of Transportation (Non-Medical): No  Physical Activity: Insufficiently Active (04/13/2024)   Exercise Vital Sign    Days of Exercise per Week: 3 days    Minutes of Exercise per Session: 20 min  Stress: Stress Concern Present (04/13/2024)   [employer] of Occupational Health - Occupational Stress Questionnaire    Feeling of Stress: To some extent  Social Connections: Socially Isolated (04/13/2024)    Social Connection and Isolation Panel    Frequency of Communication with Friends and Family: Once a week    Frequency of Social Gatherings with Friends and Family: Never    Attends Religious Services: Never    Database administrator or Organizations: No    Attends Engineer, structural: Not on file    Marital Status: Married  Catering manager Violence: Not At Risk (04/13/2024)   Humiliation, Afraid, Rape, and Kick questionnaire    Fear of Current or Ex-Partner: No    Emotionally Abused: No    Physically Abused: No    Sexually Abused: No    ROS     Objective    BP (!) 154/101   Pulse 93   Ht 5' 3 (1.6 m)   Wt 201 lb 0.6 oz (91.2 kg)   SpO2 96%   BMI 35.61 kg/m   Physical Exam Vitals and nursing note reviewed.  Constitutional:      Appearance: Normal appearance.  HENT:     Head: Normocephalic.  Eyes:     Extraocular Movements: Extraocular movements intact.     Pupils: Pupils are equal, round, and reactive to light.  Cardiovascular:     Rate and Rhythm: Normal rate and regular rhythm.  Pulmonary:     Effort: Pulmonary effort is normal.     Breath sounds: Normal breath sounds.  Musculoskeletal:     Cervical back: Normal range of motion and neck supple.  Neurological:     Mental Status: She is alert and oriented to person, place, and time.     Gait: Gait abnormal (antalgic, ambulates with walker).  Psychiatric:        Mood and Affect: Mood normal.        Thought Content: Thought content normal.       Assessment & Plan:   Problem List Items Addressed This Visit       Cardiovascular and Mediastinum   Benign hypertension - Primary   Hypertension poorly controlled. BP 152/90 mmHg. Family history of hypertension and heart disease. Pain may elevate BP. - Increase lisinopril  to 40  mg daily. Instruct to take two 20 mg tablets until 40 mg tablets available.         Nervous and Auditory   Lumbosacral radiculopathy   Chronic back pain due to advanced  scoliosis, lumbar spinal stenosis, and lumbar disc degeneration Surgery not advised unless severe symptoms like foot drop or bowel incontinence occur. Multiple specialists advised against surgery due to risks and limited benefit.             Return in about 3 months (around 07/14/2024) for chronic follow-up with PCP.   Leita Longs, FNP

## 2024-04-14 NOTE — Assessment & Plan Note (Signed)
 Hypertension poorly controlled. BP 152/90 mmHg. Family history of hypertension and heart disease. Pain may elevate BP. - Increase lisinopril  to 40 mg daily. Instruct to take two 20 mg tablets until 40 mg tablets available.

## 2024-04-15 ENCOUNTER — Other Ambulatory Visit: Payer: Self-pay | Admitting: Psychiatry

## 2024-04-15 DIAGNOSIS — F4001 Agoraphobia with panic disorder: Secondary | ICD-10-CM

## 2024-04-15 DIAGNOSIS — F411 Generalized anxiety disorder: Secondary | ICD-10-CM

## 2024-04-15 DIAGNOSIS — G2581 Restless legs syndrome: Secondary | ICD-10-CM

## 2024-04-16 ENCOUNTER — Ambulatory Visit: Payer: Self-pay

## 2024-04-16 ENCOUNTER — Encounter (HOSPITAL_COMMUNITY): Payer: Self-pay

## 2024-04-16 ENCOUNTER — Other Ambulatory Visit: Payer: Self-pay

## 2024-04-16 ENCOUNTER — Emergency Department (HOSPITAL_COMMUNITY)
Admission: EM | Admit: 2024-04-16 | Discharge: 2024-04-16 | Disposition: A | Discharge status: Home / Self Care | Attending: Emergency Medicine | Admitting: Emergency Medicine

## 2024-04-16 DIAGNOSIS — T7840XA Allergy, unspecified, initial encounter: Secondary | ICD-10-CM | POA: Diagnosis not present

## 2024-04-16 MED ORDER — FAMOTIDINE IN NACL 20-0.9 MG/50ML-% IV SOLN
20.0000 mg | Freq: Once | INTRAVENOUS | Status: AC
  Administered 2024-04-16: 20 mg via INTRAVENOUS
  Filled 2024-04-16: qty 50

## 2024-04-16 MED ORDER — DIPHENHYDRAMINE HCL 50 MG/ML IJ SOLN
25.0000 mg | Freq: Once | INTRAMUSCULAR | Status: AC
  Administered 2024-04-16: 25 mg via INTRAVENOUS
  Filled 2024-04-16: qty 1

## 2024-04-16 MED ORDER — AMLODIPINE BESYLATE 2.5 MG PO TABS: 2.5000 mg | ORAL_TABLET | Freq: Every day | ORAL | 0 refills | Status: DC

## 2024-04-16 MED ORDER — METHYLPREDNISOLONE SODIUM SUCC 125 MG IJ SOLR
125.0000 mg | Freq: Once | INTRAMUSCULAR | Status: AC
  Administered 2024-04-16: 125 mg via INTRAVENOUS
  Filled 2024-04-16: qty 2

## 2024-04-16 MED ORDER — PREDNISONE 20 MG PO TABS: ORAL_TABLET | ORAL | 0 refills | Status: DC

## 2024-04-16 NOTE — ED Provider Notes (Signed)
 [place] EMERGENCY DEPARTMENT AT [facility] Provider Note   CSN: [id number] Arrival date & time: 04/16/24  1455     Patient presents with: Allergic Reaction   Cheryl Hoover is a 68 y.o. female.   Patient states they increased her lisinopril  today and she started having swelling in her lips and tingling and itching in her hands  The history is provided by the patient and medical records. No language interpreter was used.  Allergic Reaction Presenting symptoms: itching   Presenting symptoms: no rash   Severity:  Mild Prior allergic episodes:  No prior episodes Context: not animal exposure   Relieved by:  Nothing Worsened by:  Nothing      Prior to Admission medications   Medication Sig Start Date End Date Taking? Authorizing Provider  amLODipine  (NORVASC ) 2.5 MG tablet Take 1 tablet (2.5 mg total) by mouth daily. 04/16/24  Yes Joy Reiger, MD  predniSONE  (DELTASONE ) 20 MG tablet 2 tabs po daily x 3 days 04/16/24  Yes Tavon Magnussen, MD  acetaminophen  (TYLENOL ) 500 MG tablet Take 2 tablets (1,000 mg total) by mouth every 8 (eight) hours. 02/03/18   Danella Cough, PA-C  albuterol  (VENTOLIN  HFA) 108 (90 BASE) MCG/ACT inhaler Inhale 2 puffs into the lungs every 4 (four) hours as needed for wheezing. Patient taking differently: Inhale 2 puffs into the lungs every 4 (four) hours as needed for wheezing or shortness of breath. 05/16/14   Mario Million, MD  ALPRAZolam  (XANAX ) 0.5 MG tablet Take 1 tablet (0.5 mg total) by mouth daily as needed for anxiety or sleep. 05/14/22   Cottle, Lorene KANDICE Raddle., MD  cyproheptadine  (PERIACTIN ) 4 MG tablet Take 1 tablet (4 mg total) by mouth 3 (three) times daily as needed for allergies. 03/20/23   Rodriguez-Southworth, Sylvia, PA-C  docusate sodium  (COLACE) 100 MG capsule Take 1 capsule (100 mg total) by mouth 2 (two) times daily. While taking narcotic pain medicine. 12/07/20   Aniceto Eva Grebe, PA-C  esomeprazole  (NEXIUM ) 20 MG packet  Take 20 mg by mouth daily before breakfast.    [provider]  fluticasone  (FLONASE ) 50 MCG/ACT nasal spray Place 1 spray into both nostrils 2 (two) times daily. 08/20/21   Stuart Vernell Norris, PA-C  HYDROcodone -acetaminophen  (NORCO) 10-325 MG tablet TAKE 1 TABLET BY MOUTH 4 TIMES DAILY AS NEEDED 04/12/19   [provider]  lamoTRIgine  (LAMICTAL ) 100 MG tablet TAKE 1 TABLET BY MOUTH EACH MORNING AND TAKE 1 TABLET EVERY DAY AT BEDTIME 10/21/23   Cottle, Lorene KANDICE Raddle., MD  lisinopril  (ZESTRIL ) 40 MG tablet Take 1 tablet (40 mg total) by mouth daily. 04/13/24   Bevely Doffing, FNP  loratadine  (CLARITIN ) 10 MG tablet Take 10 mg by mouth at bedtime.     [provider]  meloxicam (MOBIC) 15 MG tablet Take 15 mg by mouth daily. 03/20/19   [provider]  montelukast  (SINGULAIR ) 10 MG tablet Take 1 tablet by mouth daily. Patient taking differently: Take 10 mg by mouth at bedtime. 12/15/14   Levora Reyes SAUNDERS, MD  Multiple Vitamins-Minerals [facility] COMPLETE PO) Take by mouth.    [provider]  rOPINIRole  (REQUIP ) 0.5 MG tablet TAKE 1 TABLET BY MOUTH EVERY EVENING AND TAKE 1 TABLET BY MOUTH EVERYDAY AT BEDTIME 04/16/24   Cottle, Lorene KANDICE Raddle., MD  topiramate  (TOPAMAX ) 50 MG tablet TAKE ONE TABLET BY MOUTH EVERY MORNING AND TAKE TWO TABLETS BY MOUTH EVERY EVENING 04/16/24   Cottle, Lorene KANDICE Raddle., MD  traZODone  (  DESYREL ) 100 MG tablet TAKE 1 TABLET BY MOUTH AT BEDTIME 10/21/23   Cottle, Carey G Jr., MD  Vitamins A & D (VITAMIN A & D) ointment Apply 1 application topically as needed for dry skin.    [provider]  Omeprazole  (PRILOSEC PO) Take 20.6 mg by mouth daily.  01/17/12  [provider]    Allergies: Avocado and Penicillins    Review of Systems  Constitutional:  Negative for appetite change and fatigue.  HENT:  Negative for congestion, ear discharge and sinus pressure.        Swelling to lips  Eyes:  Negative for discharge.  Respiratory:   Negative for cough.   Cardiovascular:  Negative for chest pain.  Gastrointestinal:  Negative for abdominal pain and diarrhea.  Genitourinary:  Negative for frequency and hematuria.  Musculoskeletal:  Negative for back pain.       Itching to hands  Skin:  Positive for itching. Negative for rash.  Neurological:  Negative for seizures and headaches.  Psychiatric/Behavioral:  Negative for hallucinations.     Updated Vital Signs BP (!) 155/84   Pulse 86   Temp 98.2 F (36.8 C) (Oral)   Resp 16   Ht 5' 3 (1.6 m)   Wt 90.7 kg   SpO2 98%   BMI 35.43 kg/m   Physical Exam  (all labs ordered are listed, but only abnormal results are displayed) Labs Reviewed - No data to display  EKG: None  Radiology: No results found.   Procedures   Medications Ordered in the ED  diphenhydrAMINE  (BENADRYL ) injection 25 mg (25 mg Intravenous Given 04/16/24 1605)  famotidine  (PEPCID ) IVPB 20 mg premix (20 mg Intravenous New Bag/Given 04/16/24 1607)  methylPREDNISolone  sodium succinate (SOLU-MEDROL ) 125 mg/2 mL injection 125 mg (125 mg Intravenous Given 04/16/24 1601)                                    Medical Decision Making Risk Prescription drug management.   Moderate allergic reaction, possibly related to lisinopril .  Patient will stop her lisinopril  and start Norvasc  and is put on prednisone  and will follow-up with her doctor next week     Final diagnoses:  Allergic reaction, initial encounter    ED Discharge Orders          Ordered    predniSONE  (DELTASONE ) 20 MG tablet        04/16/24 1637    amLODipine  (NORVASC ) 2.5 MG tablet  Daily        04/16/24 1637               Suzette Pac, MD 04/19/24 1645

## 2024-04-16 NOTE — Telephone Encounter (Signed)
 Pt called in about itching in her hand that she thinks is due to the increase in her lisinopril , states also states her lips are slightly itchy and feel swollen but not visibly swollen. Pt denies and difficulty breathing. Instructed ED, pt verbalized understanding.

## 2024-04-16 NOTE — ED Triage Notes (Signed)
 Pt having reaction to Lisinopril  after PCP doubled her dose. Complaining of swollen/itchy lips and itchy hands

## 2024-04-16 NOTE — Telephone Encounter (Signed)
 FYI Only or Action Required?: Action required by provider: update on patient condition.  Patient was last seen in primary care on 04/13/2024 by Bevely Doffing, FNP.  Called Nurse Triage reporting hands itching.  Symptoms began yesterday.  Interventions attempted: Nothing.  Symptoms are: unchanged.  Triage Disposition: Go to ED Now (Notify PCP)  Patient/caregiver understands and will follow disposition?: Yes     Copied from CRM [id number]. Topic: Clinical - Red Word Triage >> Apr 16, 2024  2:33 PM Harlene ORN wrote: Red Word that prompted transfer to Nurse Triage: Patient's hands have been itching. Believes it might be due to her increased dosage of blood pressure medication Reason for Disposition  Taking an ACE Inhibitor medicine (e.g., benazepril / LOTENSIN, captopril / CAPOTEN, enalapril / VASOTEC, lisinopril  / ZESTRIL )  Answer Assessment - Initial Assessment Questions 1. ONSET: When did the swelling start? (e.g., minutes, hours, days)     2 days 2. SEVERITY: How swollen is it? (e.g., part of an upper or lower lip, both lips)     Both-they just feel swollen so mild  3. ITCHING: Is there any itching? If Yes, ask: How much?   (Scale 1-10; mild, moderate or severe)     Mild itching on lips but hands are mod- severe 4. PAIN: Is the swelling painful to touch? If Yes, ask: How painful is it?   (Scale 1-10; mild, moderate or severe)     no 5. CAUSE: What do you think is causing the lip swelling? (e.g., certain food or medicine, recent cosmetic treatment)     lisinopril  6. RECURRENT SYMPTOM: Have you had lip swelling before? If Yes, ask: When was the last time? What happened that time?     no 7. OTHER SYMPTOMS: Do you have any other symptoms? (e.g., fever, toothache)     Hands itching  Protocols used: Lip Swelling-A-AH

## 2024-04-16 NOTE — Discharge Instructions (Addendum)
 Stop taking the lisinopril .   Follow up with your md next week.   Take benadryl  for itching and return if problems

## 2024-04-17 ENCOUNTER — Emergency Department (HOSPITAL_COMMUNITY)

## 2024-04-17 ENCOUNTER — Emergency Department (HOSPITAL_COMMUNITY)
Admission: EM | Admit: 2024-04-17 | Discharge: 2024-04-17 | Disposition: A | Discharge status: Home / Self Care | Source: Ambulatory Visit

## 2024-04-17 ENCOUNTER — Encounter (HOSPITAL_COMMUNITY): Payer: Self-pay

## 2024-04-17 ENCOUNTER — Other Ambulatory Visit: Payer: Self-pay

## 2024-04-17 DIAGNOSIS — T7840XD Allergy, unspecified, subsequent encounter: Secondary | ICD-10-CM | POA: Insufficient documentation

## 2024-04-17 DIAGNOSIS — Z7951 Long term (current) use of inhaled steroids: Secondary | ICD-10-CM | POA: Insufficient documentation

## 2024-04-17 DIAGNOSIS — R0602 Shortness of breath: Secondary | ICD-10-CM | POA: Diagnosis not present

## 2024-04-17 DIAGNOSIS — R059 Cough, unspecified: Secondary | ICD-10-CM | POA: Diagnosis not present

## 2024-04-17 DIAGNOSIS — Z79899 Other long term (current) drug therapy: Secondary | ICD-10-CM | POA: Diagnosis not present

## 2024-04-17 DIAGNOSIS — D72829 Elevated white blood cell count, unspecified: Secondary | ICD-10-CM | POA: Insufficient documentation

## 2024-04-17 DIAGNOSIS — J45909 Unspecified asthma, uncomplicated: Secondary | ICD-10-CM | POA: Insufficient documentation

## 2024-04-17 DIAGNOSIS — E042 Nontoxic multinodular goiter: Secondary | ICD-10-CM | POA: Diagnosis not present

## 2024-04-17 DIAGNOSIS — I10 Essential (primary) hypertension: Secondary | ICD-10-CM | POA: Diagnosis not present

## 2024-04-17 LAB — CBC WITH DIFFERENTIAL/PLATELET
Abs Immature Granulocytes: 0.07 K/uL (ref 0.00–0.07)
Basophils Absolute: 0 K/uL (ref 0.0–0.1)
Basophils Relative: 0 %
Eosinophils Absolute: 0 K/uL (ref 0.0–0.5)
Eosinophils Relative: 0 %
HCT: 39.5 % (ref 36.0–46.0)
Hemoglobin: 12.9 g/dL (ref 12.0–15.0)
Immature Granulocytes: 0 %
Lymphocytes Relative: 9 %
Lymphs Abs: 1 K/uL (ref 0.7–4.0)
MCH: 32.2 pg (ref 26.0–34.0)
MCHC: 32.7 g/dL (ref 30.0–36.0)
MCV: 98.5 fL (ref 80.0–100.0)
Monocytes Absolute: 0.1 K/uL (ref 0.1–1.0)
Monocytes Relative: 1 %
Neutro Abs: 9.6 K/uL — ABNORMAL HIGH (ref 1.7–7.7)
Neutrophils Relative %: 89 %
Platelets: 256 K/uL (ref 150–400)
RBC: 4.01 MIL/uL (ref 3.87–5.11)
RDW: 12.5 % (ref 11.5–15.5)
WBC: 10.8 K/uL — ABNORMAL HIGH (ref 4.0–10.5)
nRBC: 0 % (ref 0.0–0.2)
nRBC: 0 /100{WBCs}

## 2024-04-17 LAB — COMPREHENSIVE METABOLIC PANEL WITH GFR
ALT: 24 U/L (ref 0–44)
AST: 26 U/L (ref 15–41)
Albumin: 4 g/dL (ref 3.5–5.0)
Alkaline Phosphatase: 89 U/L (ref 38–126)
Anion gap: 13 (ref 5–15)
BUN: 28 mg/dL — ABNORMAL HIGH (ref 8–23)
CO2: 19 mmol/L — ABNORMAL LOW (ref 22–32)
Calcium: 9.9 mg/dL (ref 8.9–10.3)
Chloride: 109 mmol/L (ref 98–111)
Creatinine, Ser: 1.17 mg/dL — ABNORMAL HIGH (ref 0.44–1.00)
GFR, Estimated: 51 mL/min — ABNORMAL LOW (ref >60)
Glucose, Bld: 161 mg/dL — ABNORMAL HIGH (ref 70–99)
Potassium: 3.8 mmol/L (ref 3.5–5.1)
Sodium: 141 mmol/L (ref 135–145)
Total Bilirubin: 0.5 mg/dL (ref 0.0–1.2)
Total Protein: 7.2 g/dL (ref 6.5–8.1)

## 2024-04-17 LAB — RESP PANEL BY RT-PCR (RSV, FLU A&B, COVID)  RVPGX2
Influenza A by PCR: NEGATIVE
Influenza B by PCR: NEGATIVE
Resp Syncytial Virus by PCR: NEGATIVE
SARS Coronavirus 2 by RT PCR: NEGATIVE

## 2024-04-17 LAB — GROUP A STREP BY PCR: Group A Strep by PCR: NOT DETECTED

## 2024-04-17 MED ORDER — IPRATROPIUM-ALBUTEROL 0.5-2.5 (3) MG/3ML IN SOLN
3.0000 mL | Freq: Once | RESPIRATORY_TRACT | Status: AC
  Administered 2024-04-17: 3 mL via RESPIRATORY_TRACT
  Filled 2024-04-17: qty 3

## 2024-04-17 MED ORDER — DEXAMETHASONE SODIUM PHOSPHATE 10 MG/ML IJ SOLN
10.0000 mg | Freq: Once | INTRAMUSCULAR | Status: AC
  Administered 2024-04-17: 10 mg via INTRAVENOUS
  Filled 2024-04-17: qty 1

## 2024-04-17 MED ORDER — FAMOTIDINE IN NACL 20-0.9 MG/50ML-% IV SOLN
20.0000 mg | Freq: Once | INTRAVENOUS | Status: AC
  Administered 2024-04-17: 20 mg via INTRAVENOUS
  Filled 2024-04-17: qty 50

## 2024-04-17 MED ORDER — DIPHENHYDRAMINE HCL 50 MG/ML IJ SOLN
25.0000 mg | Freq: Once | INTRAMUSCULAR | Status: AC
  Administered 2024-04-17: 25 mg via INTRAVENOUS
  Filled 2024-04-17: qty 1

## 2024-04-17 MED ORDER — IOHEXOL 300 MG/ML  SOLN
75.0000 mL | Freq: Once | INTRAMUSCULAR | Status: AC | PRN
  Administered 2024-04-17: 75 mL via INTRAVENOUS

## 2024-04-17 NOTE — ED Triage Notes (Signed)
 Pt c/o increased SHOB today. Pt was treated yesterday from an allergic reaction to her lisinopril . Pt states this today feeling like her throat is swelling and increased SHOB.

## 2024-04-17 NOTE — Discharge Instructions (Addendum)
 Please follow-up with your primary care doctor.  Try warm tea with honey and lemon.  Try cough drops.  Please take 3 days of prednisone .  You can also take Benadryl  for itching.  Return if you cannot swallow, difficulty speaking, shortness of breath or worsening symptoms.

## 2024-04-17 NOTE — ED Notes (Signed)
 Pt/family received d/c paperwork at this time. After going over the paperwork any questions, comments, or concerns were answered to the best of this nurse's knowledge. The pt/family verbally acknowledged the teachings/instructions.

## 2024-04-17 NOTE — ED Provider Notes (Signed)
 [place] EMERGENCY DEPARTMENT AT [facility] Provider Note   CSN: [id number] Arrival date & time: 04/17/24  1231     Patient presents with: Allergic Reaction   Cheryl Hoover is a 68 y.o. female patient with past medical history of anxiety, asthma, hypertension presenting to emergency room with complaint of sore throat, shortness of breath and itchy hands. She reports it does not feel right to swallow, denies difficulty handling secretions, denies foreign body sensation, reports normal phonation.  Patient reports that this started yesterday after she increased her dose of lisinopril  to 40 mg. No new medications, was previously doing well with this medication. Does not have any suspicious food intake or new product exposures. Reports yesterday her lips were swelling and that is now gone. No fever. Mild cough. No chest pain, no abdominal pain.     Allergic Reaction Presenting symptoms: difficulty swallowing   Presenting symptoms: no wheezing        Prior to Admission medications   Medication Sig Start Date End Date Taking? Authorizing Provider  acetaminophen  (TYLENOL ) 500 MG tablet Take 2 tablets (1,000 mg total) by mouth every 8 (eight) hours. 02/03/18   Danella Cough, PA-C  albuterol  (VENTOLIN  HFA) 108 (90 BASE) MCG/ACT inhaler Inhale 2 puffs into the lungs every 4 (four) hours as needed for wheezing. Patient taking differently: Inhale 2 puffs into the lungs every 4 (four) hours as needed for wheezing or shortness of breath. 05/16/14   Mario Million, MD  ALPRAZolam  (XANAX ) 0.5 MG tablet Take 1 tablet (0.5 mg total) by mouth daily as needed for anxiety or sleep. 05/14/22   Cottle, Lorene KANDICE Raddle., MD  amLODipine  (NORVASC ) 2.5 MG tablet Take 1 tablet (2.5 mg total) by mouth daily. 04/16/24   Suzette Pac, MD  cyproheptadine  (PERIACTIN ) 4 MG tablet Take 1 tablet (4 mg total) by mouth 3 (three) times daily as needed for allergies. 03/20/23   Rodriguez-Southworth, Sylvia, PA-C   docusate sodium  (COLACE) 100 MG capsule Take 1 capsule (100 mg total) by mouth 2 (two) times daily. While taking narcotic pain medicine. 12/07/20   Aniceto Eva Grebe, PA-C  esomeprazole  (NEXIUM ) 20 MG packet Take 20 mg by mouth daily before breakfast.    [provider]  fluticasone  (FLONASE ) 50 MCG/ACT nasal spray Place 1 spray into both nostrils 2 (two) times daily. 08/20/21   Stuart Vernell Norris, PA-C  HYDROcodone -acetaminophen  (NORCO) 10-325 MG tablet TAKE 1 TABLET BY MOUTH 4 TIMES DAILY AS NEEDED 04/12/19   [provider]  lamoTRIgine  (LAMICTAL ) 100 MG tablet TAKE 1 TABLET BY MOUTH EACH MORNING AND TAKE 1 TABLET EVERY DAY AT BEDTIME 10/21/23   Cottle, Lorene KANDICE Raddle., MD  lisinopril  (ZESTRIL ) 40 MG tablet Take 1 tablet (40 mg total) by mouth daily. 04/13/24   Bevely Doffing, FNP  loratadine  (CLARITIN ) 10 MG tablet Take 10 mg by mouth at bedtime.     [provider]  meloxicam (MOBIC) 15 MG tablet Take 15 mg by mouth daily. 03/20/19   [provider]  montelukast  (SINGULAIR ) 10 MG tablet Take 1 tablet by mouth daily. Patient taking differently: Take 10 mg by mouth at bedtime. 12/15/14   Levora Reyes SAUNDERS, MD  Multiple Vitamins-Minerals [facility] COMPLETE PO) Take by mouth.    [provider]  predniSONE  (DELTASONE ) 20 MG tablet 2 tabs po daily x 3 days 04/16/24   Suzette Pac, MD  rOPINIRole  (REQUIP ) 0.5 MG tablet TAKE 1 TABLET BY MOUTH EVERY EVENING AND TAKE 1 TABLET BY  MOUTH EVERYDAY AT BEDTIME 04/16/24   Cottle, Lorene KANDICE Raddle., MD  topiramate  (TOPAMAX ) 50 MG tablet TAKE ONE TABLET BY MOUTH EVERY MORNING AND TAKE TWO TABLETS BY MOUTH EVERY EVENING 04/16/24   Cottle, Lorene KANDICE Raddle., MD  traZODone  (DESYREL ) 100 MG tablet TAKE 1 TABLET BY MOUTH AT BEDTIME 10/21/23   Cottle, Carey G Jr., MD  Vitamins A & D (VITAMIN A & D) ointment Apply 1 application topically as needed for dry skin.    [provider]  Omeprazole  (PRILOSEC PO) Take 20.6 mg by mouth daily.   01/17/12  [provider]    Allergies: Lisinopril , Penicillins, and Avocado    Review of Systems  HENT:  Positive for sore throat and trouble swallowing. Negative for dental problem, drooling, facial swelling, mouth sores, sneezing and voice change.   Respiratory:  Positive for cough and shortness of breath. Negative for chest tightness, wheezing and stridor.   Cardiovascular:  Negative for chest pain and leg swelling.    Updated Vital Signs BP (!) 143/89   Pulse 85   Temp 97.9 F (36.6 C) (Oral)   Resp 20   Ht 5' 3 (1.6 m)   Wt 90.7 kg   SpO2 95%   BMI 35.43 kg/m   Physical Exam Vitals and nursing note reviewed.  Constitutional:      General: She is not in acute distress.    Appearance: She is not toxic-appearing.  HENT:     Head: Normocephalic and atraumatic.     Jaw: No trismus.     Comments: No obvious oral swelling.  Normal-appearing posterior pharynx.  Uvula is midline, no uvula swelling.  Patient is handling secretions and has normal phonation.    Mouth/Throat:     Pharynx: Posterior oropharyngeal erythema present. No pharyngeal swelling or oropharyngeal exudate.  Eyes:     General: No scleral icterus.    Conjunctiva/sclera: Conjunctivae normal.  Cardiovascular:     Rate and Rhythm: Normal rate and regular rhythm.     Pulses: Normal pulses.     Heart sounds: Normal heart sounds.  Pulmonary:     Effort: Pulmonary effort is normal. No respiratory distress.     Breath sounds: Normal breath sounds.     Comments: Lungs are clear to auscultation bilaterally. Abdominal:     General: Abdomen is flat. Bowel sounds are normal.     Palpations: Abdomen is soft.     Tenderness: There is no abdominal tenderness.  Musculoskeletal:     Right lower leg: No edema.     Left lower leg: No edema.  Skin:    General: Skin is warm and dry.     Findings: No lesion.     Comments: No rash.    Neurological:     General: No focal deficit present.     Mental Status: She  is alert and oriented to person, place, and time. Mental status is at baseline.     (all labs ordered are listed, but only abnormal results are displayed) Labs Reviewed  CBC WITH DIFFERENTIAL/PLATELET - Abnormal; Notable for the following components:      Result Value   WBC 10.8 (*)    Neutro Abs 9.6 (*)    All other components within normal limits  COMPREHENSIVE METABOLIC PANEL WITH GFR - Abnormal; Notable for the following components:   CO2 19 (*)    Glucose, Bld 161 (*)    BUN 28 (*)    Creatinine, Ser 1.17 (*)  GFR, Estimated 51 (*)    All other components within normal limits  GROUP A STREP BY PCR  RESP PANEL BY RT-PCR (RSV, FLU A&B, COVID)  [id number]    EKG: None  Radiology: CT Soft Tissue Neck W Contrast Result Date: 04/17/2024 CLINICAL DATA:  Provided history: Epiglottitis or tonsillitis suspected. Additional history provided: recent allergic reaction to lisinopril . The patient reports a sensation of throat swelling and increased shortness of breath. EXAM: CT NECK WITH CONTRAST TECHNIQUE: Multidetector CT imaging of the neck was performed using the standard protocol following the bolus administration of intravenous contrast. RADIATION DOSE REDUCTION: This exam was performed according to the departmental dose-optimization program which includes automated exposure control, adjustment of the mA and/or kV according to patient size and/or use of iterative reconstruction technique. CONTRAST:  75mL OMNIPAQUE  IOHEXOL  300 MG/ML  SOLN COMPARISON:  None. FINDINGS: Pharynx and larynx: No appreciable swelling or mass within the oral cavity, pharynx or larynx. No retropharyngeal collection. No significant airway effacement. Salivary glands: No inflammation, mass, or stone. Thyroid : Subcentimeter thyroid  nodules not meeting consensus criteria for ultrasound follow-up based on size. No follow-up imaging recommended. Reference: J Am Coll Radiol. 2015 Feb;12(2): 143-50. Lymph nodes: No pathologically  enlarged lymph nodes identified. Vascular: The major vascular structures of the neck are patent. Atherosclerotic plaque within the aortic arch, proximal major branch vessels of the neck and carotid arteries. Limited intracranial: No evidence of an acute intracranial abnormality within the field of view. Visualized orbits: No orbital mass or acute orbital finding. Mastoids and visualized paranasal sinuses: Portions of the frontal sinuses are excluded from the field of view superiorly. Mild mucosal thickening within the right maxillary sinus. 2.4 cm mucous retention cyst or polyp within the left maxillary sinus. No significant mastoid effusion. Skeleton: Nonspecific reversal of the expected cervical lordosis. Spondylosis at the cervical and visible upper thoracic levels. 4 mm C4-C5 grade 1 anterolisthesis. No acute fracture or aggressive osseous lesion. Upper chest: No consolidation within the imaged lung apices. IMPRESSION: 1. No appreciable swelling or mass within the oral cavity, pharynx or larynx. No significant airway effacement. 2. Paranasal sinus disease as described. 3. Spondylosis at the cervical and visible upper thoracic levels. 4. 4 mm C4-C5 grade 1 anterolisthesis. 5. Aortic Atherosclerosis (ICD10-I70.0). Electronically Signed   By: Rockey Childs D.O.   On: 04/17/2024 16:09     Procedures   Medications Ordered in the ED  famotidine  (PEPCID ) IVPB 20 mg premix (20 mg Intravenous New Bag/Given 04/17/24 1344)  dexamethasone  (DECADRON ) injection 10 mg (10 mg Intravenous Given 04/17/24 1342)  diphenhydrAMINE  (BENADRYL ) injection 25 mg (25 mg Intravenous Given 04/17/24 1342)  ipratropium-albuterol  (DUONEB) 0.5-2.5 (3) MG/3ML nebulizer solution 3 mL (3 mLs Nebulization Given 04/17/24 1336)  iohexol  (OMNIPAQUE ) 300 MG/ML solution 75 mL (75 mLs Intravenous Contrast Given 04/17/24 1401)                                    Medical Decision Making Amount and/or Complexity of Data Reviewed Labs:  ordered. Radiology: ordered.  Risk Prescription drug management.   This patient presents to the ED for concern of sore throat and education side effect, this involves an extensive number of treatment options, and is a complaint that carries with it a high risk of complications and morbidity.  The differential diagnosis includes anaphylaxis, medication side effect, GERD, angioedema   Co morbidities that complicate the patient evaluation  Anxiety  Hypertension  Additional history obtained:  Additional history obtained from ED visit yesterday for similar treated with Solu-Medrol , famotidine  and Benadryl  with improvement in symptoms.  Sent home with 5 days of prednisone .   Lab Tests:  I personally interpreted labs.  The pertinent results include:   Mild leukocytosis at 10.8 patient has been taking steroids due to her symptoms.  Hemoglobin is 12.9 CMP with baseline creatinine of 1.17.  No electrolyte abnormality.  Normal liver function. Strep negative, respiratory panel negative   Imaging Studies ordered:  I ordered imaging studies including CT soft tissue of neck I independently visualized and interpreted imaging which showed no acute abnormality.  Sinus disease. I agree with the radiologist interpretation   Cardiac Monitoring: / EKG:  The patient was maintained on a cardiac monitor.     Problem List / ED Course / Critical interventions / Medication management  Patient reporting with complaint of some throat swelling sensation and sore throat as well as itchy and tingling sensation to her palms.  This started yesterday.  She was seen yesterday for similar thing but then symptoms returned she wanted to be really reevaluated.  On my exam she is stable and well-appearing.  I do not observe any posterior pharynx swelling or facial swelling.  She does have some mild posterior pharynx erythema.  Her lungs are clear to auscultation.  She is not having any abdominal pain.  Her labs  are overall reassuring with some mild leukocytosis.  She is strep negative and respiratory panel negative.  Will obtain CT soft tissue and give Decadron , famotidine  and Benadryl . I ordered medication including Decadron , famotidine , DuoNeb, Benadryl . Reevaluation of the patient after these medicines showed that the patient improved I have reviewed the patients home medicines and have made adjustments as needed. Patient was observed for several hours in ER.  Vital stable throughout stay patient patient is feeling better after receiving treatment here in the ER.  I feel she is appropriate for discharge with outpatient follow-up.  She will continue her steroids and Benadryl  for the next 3 days and follow-up with primary care.  She was given return precautions.        Final diagnoses:  Allergic reaction, subsequent encounter    ED Discharge Orders     None          Shermon Warren SAILOR, PA-C 04/17/24 1757    Simon Lavonia SAILOR, MD 04/18/24 [phone]

## 2024-04-19 ENCOUNTER — Telehealth: Payer: Self-pay

## 2024-04-19 NOTE — Telephone Encounter (Signed)
 LVM for patient to call back and schedule REGULAR OV for ED follow up

## 2024-04-20 ENCOUNTER — Ambulatory Visit: Payer: Self-pay

## 2024-04-20 VITALS — BP 138/89 | HR 98 | Ht 63.0 in | Wt 201.1 lb

## 2024-04-20 DIAGNOSIS — I10 Essential (primary) hypertension: Secondary | ICD-10-CM | POA: Diagnosis not present

## 2024-04-20 MED ORDER — AMLODIPINE BESYLATE 5 MG PO TABS: 5.0000 mg | ORAL_TABLET | Freq: Every day | ORAL | 2 refills | Status: DC

## 2024-04-20 NOTE — Progress Notes (Signed)
 Established Patient Office Visit  Subjective   Patient ID: Cheryl Hoover, female    DOB: 1956-02-23  Age: 68 y.o. MRN: [id number]  Chief Complaint  Patient presents with   Hospitalization Follow-up    Er follow up    HPI  Discussed the use of AI scribe software for clinical note transcription with the patient, who gave verbal consent to proceed.  History of Present Illness   Cheryl Hoover is a 68 year old female with hypertension who presents with swelling and itching after a change in blood pressure medication.  Cutaneous and mucosal symptoms - Swelling and pruritus began after a recent change in antihypertensive medication - Sensation of 'all swelling up' - Persistent pruritus of the hands, described as 'really itchy' and 'almost painful' - Itching has spread to the fingers and occasionally causes a burning sensation on the arms - Lips feel swollen, though no visible swelling is present  Response to treatment - Received prednisone  and an intravenous medication (possibly Decadron  or Benadryl ) in the emergency room - No improvement in symptoms following these interventions  Renal and hepatic function concerns - Concern about potential kidney issues related to antihypertensive medication - Recent blood work in the emergency room showed stable kidney function and normal liver function - Aware that some medications can affect kidney function and is monitoring for changes in laboratory results  Hydration and appetite - Possible dehydration due to low oral fluid intake - Currently attempting to increase water  consumption - Poor appetite  Musculoskeletal symptoms - History of neck and back pain, which can be intermittently symptomatic - Current symptoms of pruritus and burning are new and distinct from prior musculoskeletal complaints  Infectious disease evaluation - Recent testing for COVID-19, influenza, streptococcal infection, and RSV all  negative  Antihypertensive medication changes - Currently taking a new antihypertensive medication, brought to the visit - Discontinued lisinopril , which was previously listed as an allergy - Obtains medications via mail order pharmacy       Patient Active Problem List   Diagnosis Date Noted   Headache 04/13/2024   Trochanteric bursitis of right hip 04/22/2022   Trochanteric bursitis of left hip 11/11/2021   Sensorineural hearing loss (SNHL) of both ears 10/24/2021   Tinnitus of both ears 10/24/2021   Metatarsalgia of right foot 11/01/2020   Bunion 11/01/2020   Bipolar 2 disorder (HCC) 09/23/2018   Phobia 09/23/2018   Low back pain 07/10/2018   Obese 02/04/2018   Overweight (BMI 25.0-29.9) 06/04/2017   S/P right THA, AA 06/03/2017   Lumbosacral radiculopathy 04/26/2015   Degenerative lumbar spinal stenosis 04/26/2015   Sliding hiatal hernia, recurrent 09/16/2011   ANXIETY DEPRESSION 04/21/2009   Asthma 04/21/2009   GERD 04/21/2009   Constipation 04/21/2009   Idiopathic scoliosis and kyphoscoliosis 04/21/2009   EARLY SATIETY 04/21/2009   ABDOMINAL PAIN, EPIGASTRIC 04/21/2009   ANOREXIA NERVOSA, HX OF 04/21/2009   BULIMIA, HX OF 04/21/2009   Benign hypertension 09/26/1998      ROS    Objective:     BP 138/89   Pulse 98   Ht 5' 3 (1.6 m)   Wt 201 lb 1.3 oz (91.2 kg)   SpO2 96%   BMI 35.62 kg/m  BP Readings from Last 3 Encounters:  04/20/24 138/89  04/17/24 137/65  04/16/24 (!) 140/77   Wt Readings from Last 3 Encounters:  04/20/24 201 lb 1.3 oz (91.2 kg)  04/17/24 200 lb (90.7 kg)  04/16/24 200 lb (90.7 kg)  Physical Exam Vitals and nursing note reviewed.  Constitutional:      Appearance: Normal appearance.  HENT:     Head: Normocephalic.  Eyes:     Extraocular Movements: Extraocular movements intact.     Pupils: Pupils are equal, round, and reactive to light.  Cardiovascular:     Rate and Rhythm: Normal rate and regular rhythm.   Pulmonary:     Effort: Pulmonary effort is normal.     Breath sounds: Normal breath sounds.  Musculoskeletal:     Cervical back: Normal, normal range of motion and neck supple.     Thoracic back: Scoliosis present.     Lumbar back: Scoliosis present.  Neurological:     Mental Status: She is alert and oriented to person, place, and time.  Psychiatric:        Mood and Affect: Mood normal.        Thought Content: Thought content normal.    No results found for any visits on 04/20/24.    The ASCVD Risk score (Arnett DK, et al., 2019) failed to calculate for the following reasons:   Cannot find a previous HDL lab   Cannot find a previous total cholesterol lab    Assessment & Plan:   Problem List Items Addressed This Visit       Cardiovascular and Mediastinum   Benign hypertension - Primary   Relevant Medications   amLODipine  (NORVASC ) 5 MG tablet    Return in about 7 weeks (around 06/07/2024) for chronic follow-up with PCP.    Cheryl Longs, FNP

## 2024-04-25 NOTE — Assessment & Plan Note (Signed)
 Hypertension Blood pressure slightly elevated on new antihypertensive. Recent medication change due to allergic reaction. - Increase current dose of amlodipine  to 5 mg. - Coordinate with mail order pharmacy for medication adjustments. - Recheck blood pressure in mid-October.  Chronic kidney disease, unspecified stage Kidney function consistent with previous levels. Concern about medication effects on kidneys. - Encourage increased water  intake to support kidney function.

## 2024-04-30 NOTE — Assessment & Plan Note (Signed)
 Chronic back pain due to advanced scoliosis, lumbar spinal stenosis, and lumbar disc degeneration Surgery not advised unless severe symptoms like foot drop or bowel incontinence occur. Multiple specialists advised against surgery due to risks and limited benefit.

## 2024-05-04 DIAGNOSIS — Z79899 Other long term (current) drug therapy: Secondary | ICD-10-CM | POA: Diagnosis not present

## 2024-05-04 DIAGNOSIS — Z5181 Encounter for therapeutic drug level monitoring: Secondary | ICD-10-CM | POA: Diagnosis not present

## 2024-05-04 DIAGNOSIS — G894 Chronic pain syndrome: Secondary | ICD-10-CM | POA: Diagnosis not present

## 2024-05-04 DIAGNOSIS — M5416 Radiculopathy, lumbar region: Secondary | ICD-10-CM | POA: Diagnosis not present

## 2024-05-04 DIAGNOSIS — R1032 Left lower quadrant pain: Secondary | ICD-10-CM | POA: Diagnosis not present

## 2024-05-05 ENCOUNTER — Ambulatory Visit: Payer: Self-pay

## 2024-05-05 NOTE — Telephone Encounter (Signed)
 Patient scheduled for acute visit on 9/12

## 2024-05-05 NOTE — Telephone Encounter (Signed)
   FYI Only or Action Required?: Action required by provider: update on patient condition.  Patient was last seen in primary care on 04/20/2024 by Bevely Doffing, FNP.  Called Nurse Triage reporting Foot Swelling and hand swelling.  Symptoms began several days ago.  Interventions attempted: Nothing.  Symptoms are: unchanged.  Triage Disposition: See Physician Within 24 Hours  Patient/caregiver understands and will follow disposition?: Yes  Copied from CRM [id number]. Topic: Clinical - Medical Advice >> May 05, 2024 12:47 PM Debby BROCKS wrote: Reason for CRM: Patient wants to send a message to her PCP to see if there is alternative medication. She is taking blood pressure medication (amLODipine  (NORVASC ) 5 MG tablet) but it causes her feet and hands to swell which gets better overnight but then it returns in the morning. Reason for Disposition  [1] MODERATE leg swelling (e.g., swelling extends up to knees) AND [2] new-onset or getting worse  Answer Assessment - Initial Assessment Questions Also reports mild hand swelling  1. ONSET: When did the swelling start? (e.g., minutes, hours, days)     A few days ago 2. LOCATION: What part of the leg is swollen?  Are both legs swollen or just one leg?     Both legs, right worse than left 3. SEVERITY: How bad is the swelling? (e.g., localized; mild, moderate, severe)     Swelling extends up to knee with some pitting edema Swelling goes down overnight, but increases as the day progresses 4. REDNESS: Is there redness or signs of infection?     A little 5. PAIN: Is the swelling painful to touch? If Yes, ask: How painful is it?   (Scale 1-10; mild, moderate or severe)     Mild pain 6. FEVER: Do you have a fever? If Yes, ask: What is it, how was it measured, and when did it start?      denies 7. CAUSE: What do you think is causing the leg swelling?     Concerned it is related to amlodipine  8. MEDICAL HISTORY: Do you have a  history of blood clots (e.g., DVT), cancer, heart failure, kidney disease, or liver failure?     denies 9. RECURRENT SYMPTOM: Have you had leg swelling before? If Yes, ask: When was the last time? What happened that time?    Over 20 years ago when patient weighed more 10. OTHER SYMPTOMS: Do you have any other symptoms? (e.g., chest pain, difficulty breathing)       denies 11. PREGNANCY: Is there any chance you are pregnant? When was your last menstrual period?       N/A  Protocols used: Leg Swelling and Edema-A-AH

## 2024-05-07 ENCOUNTER — Encounter: Payer: Self-pay | Admitting: Nurse Practitioner

## 2024-05-07 ENCOUNTER — Ambulatory Visit (INDEPENDENT_AMBULATORY_CARE_PROVIDER_SITE_OTHER): Admitting: Nurse Practitioner

## 2024-05-07 VITALS — BP 134/68 | HR 81 | Temp 98.1°F | Ht 63.0 in | Wt 202.8 lb

## 2024-05-07 DIAGNOSIS — R109 Unspecified abdominal pain: Secondary | ICD-10-CM

## 2024-05-07 DIAGNOSIS — R600 Localized edema: Secondary | ICD-10-CM

## 2024-05-07 LAB — POCT URINALYSIS DIPSTICK

## 2024-05-07 MED ORDER — HYDROCHLOROTHIAZIDE 12.5 MG PO CAPS: 12.5000 mg | ORAL_CAPSULE | Freq: Every day | ORAL | 0 refills | Status: DC

## 2024-05-09 ENCOUNTER — Encounter: Payer: Self-pay | Admitting: Nurse Practitioner

## 2024-05-09 NOTE — Progress Notes (Signed)
 Subjective:    Patient ID: Cheryl Hoover, female    DOB: 08/19/1956, 68 y.o.   MRN: [id number]  HPI Discussed the use of AI scribe software for clinical note transcription with the patient, who gave verbal consent to proceed.  History of Present Illness Cheryl Hoover is a 68 year old female with hypertension who presents with swelling in her lower extremities.  She experienced swelling and itching in her hands and lips after an increase in her lisinopril  dosage, leading to an allergic reaction with difficulty swallowing. She visited the ER, where her medication was switched to amlodipine . Since the switch, the swallowing has improved. Has had itching in her hands, especially between her fingers, and tingling in her lips. These symptoms have persisted. The medication was switched approximately two weeks ago, and she has been experiencing these symptoms since then. She uses lidocaine  cream for her hands, which provides temporary relief.  She has a history of surgery on her foot and hips and has been experiencing swelling in her ankle, which was massive on one occasion. She denies any changes in salt intake or activity level that could have contributed to the swelling. She has not used a diuretic recently but recalls using one in the past. Denies orthopnea. States it has improved over the past few days.   She has a history of asthma and uses inhalers and Singulair , which are effective. No breathing problems at night as long as she takes her medication. She also mentions a persistent pain in her side that has been present for a couple of weeks, which worsens with movement and is more pronounced in the morning. No bowel issues and has not had a colonoscopy in the past ten years. Denies urinary symptoms.   She reports that her kidney function was recently tested and that it was okay. Her blood sugar levels were noted to be 161, but this was not a fasting measurement. Her hemoglobin A1c was  checked in March and was normal.  Review of Systems  Constitutional:  Negative for fever.  HENT:  Negative for trouble swallowing.   Respiratory:  Negative for cough, chest tightness, shortness of breath and wheezing.   Cardiovascular:  Positive for leg swelling. Negative for chest pain.  Genitourinary:  Positive for flank pain. Negative for difficulty urinating, dysuria, frequency, pelvic pain and urgency.   Social History   Tobacco Use   Smoking status: Former    Current packs/day: 0.00    Types: Cigarettes    Quit date: 08/26/2008    Years since quitting: 15.7   Smokeless tobacco: Never  Vaping Use   Vaping status: Never Used  Substance Use Topics   Alcohol use: No    Alcohol/week: 0.0 standard drinks of alcohol    Comment: hx of    Drug use: Yes    Comment: hx of years ago - marijuana         Objective:   Physical Exam Vitals and nursing note reviewed.  Constitutional:      General: She is not in acute distress.    Comments: Exam performed in the chair. Unable to get onto the exam table.   Cardiovascular:     Rate and Rhythm: Normal rate and regular rhythm.  Pulmonary:     Effort: Pulmonary effort is normal.     Breath sounds: Normal breath sounds.  Abdominal:     General: There is no distension.     Palpations: Abdomen is soft.  Tenderness: There is no abdominal tenderness. There is no left CVA tenderness.  Musculoskeletal:     Right lower leg: Edema present.     Left lower leg: Edema present.     Comments: Trace pitting edema bilaterally.   Skin:    General: Skin is warm and dry.  Neurological:     Mental Status: She is alert and oriented to person, place, and time.  Psychiatric:        Mood and Affect: Mood normal.        Behavior: Behavior normal.        Thought Content: Thought content normal.    Today's Vitals   05/07/24 1428  BP: 134/68  Pulse: 81  Temp: 98.1 F (36.7 C)  SpO2: 96%  Weight: 202 lb 12.8 oz (92 kg)  Height: 5' 3 (1.6 m)   PainSc: 0-No pain   Body mass index is 35.92 kg/m.  UA dipstick negative.         Assessment & Plan:  1. Peripheral edema (Primary) Meds ordered this encounter  Medications   hydrochlorothiazide  (MICROZIDE ) 12.5 MG capsule    Sig: Take 1 capsule (12.5 mg total) by mouth daily. Prn swelling    Dispense:  90 capsule    Refill:  0    Supervising Provider:   ALPHONSA HAMILTON A [[id number]]  - Prescribe hydrochlorothiazide  12.5 mg as needed for swelling. - Advise monitoring dietary salt intake and activity levels to manage swelling. - Continue using lidocaine  cream for symptomatic relief of hand pruritus. HTN managed with amlodipine . Blood pressure well-controlled at 134/68 mmHg. Amlodipine  effective but may contribute to peripheral edema. - Continue amlodipine  for blood pressure management. - Monitor blood pressure at home and report any significant changes to the provider  2. Flank pain Pain described as stabbing, worsens with movement. Differential includes orthopedic issues, nerve pain, or abdominal causes. Previous B12, folate, and ferritin levels normal. - Order urinalysis to rule out urinary tract infection or hematuria. - Advise follow-up with primary care provider for further evaluation, potentially including imaging studies if symptoms persist. - Warning signs reviewed. Go to ED or UC if new or worsening symptoms.   Return for Follow up with PCP as planned.

## 2024-05-11 DIAGNOSIS — H04123 Dry eye syndrome of bilateral lacrimal glands: Secondary | ICD-10-CM | POA: Diagnosis not present

## 2024-05-17 ENCOUNTER — Ambulatory Visit: Payer: Medicare HMO | Admitting: Psychiatry

## 2024-05-19 ENCOUNTER — Other Ambulatory Visit: Payer: Self-pay | Admitting: Psychiatry

## 2024-05-19 DIAGNOSIS — F411 Generalized anxiety disorder: Secondary | ICD-10-CM

## 2024-05-19 DIAGNOSIS — F4001 Agoraphobia with panic disorder: Secondary | ICD-10-CM

## 2024-05-27 ENCOUNTER — Telehealth: Payer: Self-pay | Admitting: Psychiatry

## 2024-05-27 MED ORDER — TOPIRAMATE 100 MG PO TABS: 100.0000 mg | ORAL_TABLET | Freq: Two times a day (BID) | ORAL | 0 refills | Status: DC

## 2024-05-27 NOTE — Telephone Encounter (Signed)
 New Rx for 100 mg BID sent.

## 2024-05-27 NOTE — Telephone Encounter (Signed)
 Pharmacy lvm stating insurance will only cover Topiramate  50 mg  max 3/d. Requesting to send new Rx written for 100 mg 1 twice daily. Insurance will cover. [phone] Melissa

## 2024-05-28 ENCOUNTER — Telehealth: Payer: Self-pay | Admitting: Psychiatry

## 2024-05-28 ENCOUNTER — Other Ambulatory Visit: Payer: Self-pay

## 2024-05-28 DIAGNOSIS — F411 Generalized anxiety disorder: Secondary | ICD-10-CM

## 2024-05-28 MED ORDER — TOPIRAMATE 50 MG PO TABS: ORAL_TABLET | ORAL | 0 refills | Status: DC

## 2024-05-28 NOTE — Telephone Encounter (Signed)
 Corrected Rx sent. Pt aware.

## 2024-05-28 NOTE — Telephone Encounter (Signed)
 Pt LVM @ 1:31p stating that her mail order script for Topirmate is wrong.  Next appt 12/2

## 2024-05-28 NOTE — Telephone Encounter (Signed)
 The way the script was written was for 50 mg tablets, two twice a day. This was not covered by insurance and rewrote Rx for 100 mg twice a day. Patient notified.

## 2024-06-17 ENCOUNTER — Ambulatory Visit (INDEPENDENT_AMBULATORY_CARE_PROVIDER_SITE_OTHER)

## 2024-06-17 VITALS — Ht 63.0 in | Wt 195.0 lb

## 2024-06-17 DIAGNOSIS — Z780 Asymptomatic menopausal state: Secondary | ICD-10-CM

## 2024-06-17 DIAGNOSIS — Z0001 Encounter for general adult medical examination with abnormal findings: Secondary | ICD-10-CM | POA: Diagnosis not present

## 2024-06-17 DIAGNOSIS — H9190 Unspecified hearing loss, unspecified ear: Secondary | ICD-10-CM | POA: Diagnosis not present

## 2024-06-17 DIAGNOSIS — Z0000 Encounter for general adult medical examination without abnormal findings: Secondary | ICD-10-CM

## 2024-06-17 DIAGNOSIS — Z5320 Procedure and treatment not carried out because of patient's decision for unspecified reasons: Secondary | ICD-10-CM

## 2024-06-17 DIAGNOSIS — H9193 Unspecified hearing loss, bilateral: Secondary | ICD-10-CM

## 2024-06-17 NOTE — Patient Instructions (Signed)
 [name],  Thank you for taking the time for your Medicare Wellness Visit. I appreciate your continued commitment to your health goals. Please review the care plan we discussed, and feel free to reach out if I can assist you further.  Medicare recommends these wellness visits once per year to help you and your care team stay ahead of potential health issues. These visits are designed to focus on prevention, allowing your provider to concentrate on managing your acute and chronic conditions during your regular appointments.  Please note that Annual Wellness Visits do not include a physical exam. Some assessments may be limited, especially if the visit was conducted virtually. If needed, we may recommend a separate in-person follow-up with your provider.  Ongoing Care  Seeing your primary care provider every 3 to 6 months helps us  monitor your health and provide consistent, personalized care.   Referrals   Osteoporosis Screening An order was placed for you to have your Osteoporosis Screening. Call the number below to schedule that [facility]  [phone]   You have asked to have a hearing test due to hearing loss. You've been referred to [facility]. If you haven't heard from them in about 7-10 business days, please call their office to schedule an appointment.   [facility] [address]  [address] [place] [address] PHONE: [phone]   Recommended Screenings:  Health Maintenance  Topic Date Due   Hepatitis C Screening  Never done   DTaP/Tdap/Td vaccine (1 - Tdap) Never done   Colon Cancer Screening  Never done   DEXA scan (bone density measurement)  07/16/2024   COVID-19 Vaccine (6 - Mixed Product risk 2025-26 season) 11/05/2024   Breast Cancer Screening  11/06/2024   Medicare Annual Wellness Visit  06/17/2025   Pneumococcal Vaccine for age over 37  Completed   Flu Shot  Completed   Zoster (Shingles) Vaccine  Completed   Meningitis B Vaccine  Aged Out        06/17/2024    1:48 PM  Advanced Directives  Does Patient Have a Medical Advance Directive? No  Would patient like information on creating a medical advance directive? Yes (MAU/Ambulatory/Procedural Areas - Information given)    Advance Care Planning is important because it: Ensures you receive medical care that aligns with your values, goals, and preferences. Provides guidance to your family and loved ones, reducing the emotional burden of decision-making during critical moments.  Vision: Annual vision screenings are recommended for early detection of glaucoma, cataracts, and diabetic retinopathy. These exams can also reveal signs of chronic conditions such as diabetes and high blood pressure.  Dental: Annual dental screenings help detect early signs of oral cancer, gum disease, and other conditions linked to overall health, including heart disease and diabetes.  Please see the attached documents for additional preventive care recommendations.

## 2024-06-17 NOTE — Progress Notes (Signed)
 Please attest and cosign this visit due to patients primary care provider not being immediately available at the time the visit was completed.   Subjective:   Cheryl Hoover is a 68 y.o. who presents for a Medicare Wellness preventive visit. As a reminder, Annual Wellness Visits don't include a physical exam, and some assessments may be limited, especially if this visit is performed virtually. We may recommend an in-person follow-up visit with your provider if needed.  Visit Complete: Virtual I connected with  Cheryl Hoover on 06/17/24 by a video and audio enabled telemedicine application and verified that I am speaking with the correct person using two identifiers.  Patient Location: Home  Provider Location: Home Office  I discussed the limitations of evaluation and management by telemedicine. The patient expressed understanding and agreed to proceed.  Vital Signs: Because this visit was a virtual/telehealth visit, some criteria may be missing or patient reported. Any vitals not documented were not able to be obtained and vitals that have been documented are patient reported.  Persons Participating in Visit: Patient.  AWV Questionnaire: No: Patient Medicare AWV questionnaire was not completed prior to this visit. Cardiac Risk Factors include: advanced age (>110men, >75 women);hypertension;obesity (BMI >30kg/m2);sedentary lifestyle     Objective:    Today's Vitals   06/17/24 1351 06/17/24 1352  Weight: 195 lb (88.5 kg)   Height: 5' 3 (1.6 m)   PainSc:  4    Body mass index is 34.54 kg/m.    06/17/2024    1:48 PM 04/17/2024   12:41 PM 04/16/2024    3:04 PM 12/07/2020    6:31 AM 11/30/2020    9:22 AM 11/09/2020    2:06 PM 07/06/2020   10:53 AM  Advanced Directives  Does Patient Have a Medical Advance Directive? No No No No No No No  Would patient like information on creating a medical advance directive? Yes (MAU/Ambulatory/Procedural Areas - Information given) No -  Patient declined  No - Patient declined No - Patient declined      Current Medications (verified) Outpatient Encounter Medications as of 06/17/2024  Medication Sig   acetaminophen  (TYLENOL ) 500 MG tablet Take 2 tablets (1,000 mg total) by mouth every 8 (eight) hours.   albuterol  (VENTOLIN  HFA) 108 (90 BASE) MCG/ACT inhaler Inhale 2 puffs into the lungs every 4 (four) hours as needed for wheezing.   ALPRAZolam  (XANAX ) 0.5 MG tablet Take 1 tablet (0.5 mg total) by mouth daily as needed for anxiety or sleep.   amLODipine  (NORVASC ) 5 MG tablet Take 1 tablet (5 mg total) by mouth daily.   docusate sodium  (COLACE) 100 MG capsule Take 1 capsule (100 mg total) by mouth 2 (two) times daily. While taking narcotic pain medicine.   esomeprazole  (NEXIUM ) 20 MG packet Take 20 mg by mouth daily before breakfast.   hydrochlorothiazide  (MICROZIDE ) 12.5 MG capsule Take 1 capsule (12.5 mg total) by mouth daily. Prn swelling   HYDROcodone -acetaminophen  (NORCO) 10-325 MG tablet TAKE 1 TABLET BY MOUTH 4 TIMES DAILY AS NEEDED   lamoTRIgine  (LAMICTAL ) 100 MG tablet TAKE 1 TABLET BY MOUTH EACH MORNING AND TAKE 1 TABLET EVERY DAY AT BEDTIME   loratadine  (CLARITIN ) 10 MG tablet Take 10 mg by mouth at bedtime.    meloxicam (MOBIC) 15 MG tablet Take 15 mg by mouth daily.   montelukast  (SINGULAIR ) 10 MG tablet Take 1 tablet by mouth daily.   Multiple Vitamins-Minerals (MULTI COMPLETE PO) Take by mouth.   rOPINIRole  (REQUIP ) 0.5 MG tablet  TAKE 1 TABLET BY MOUTH EVERY EVENING AND TAKE 1 TABLET BY MOUTH EVERYDAY AT BEDTIME   topiramate  (TOPAMAX ) 50 MG tablet Take 50 mg QAM and 100 mg at 5 PM   traZODone  (DESYREL ) 100 MG tablet TAKE 1 TABLET BY MOUTH AT BEDTIME   Vitamins A & D (VITAMIN A & D) ointment Apply 1 application topically as needed for dry skin.   [DISCONTINUED] Omeprazole  (PRILOSEC PO) Take 20.6 mg by mouth daily.   No facility-administered encounter medications on file as of 06/17/2024.    Allergies  (verified) Lisinopril , Penicillins, and Avocado   History: Past Medical History:  Diagnosis Date   Allergy    Anemia    Anxiety    Arthritis    Asthma    Bipolar disorder (HCC)    Complication of anesthesia    woke up during surgery    Depression    GERD (gastroesophageal reflux disease)    Hypertension    Scoliosis    Substance abuse (HCC)    Past Surgical History:  Procedure Laterality Date   ARTHRODESIS METATARSALPHALANGEAL JOINT (MTPJ) Right 12/07/2020   Procedure: Right hallux metatarsophalangeal joint arthrodesis and silver bunionectomy;  Surgeon: Kit Rush, MD;  Location: [facility];  Service: Orthopedics;  Laterality: Right;   BREAST BIOPSY Left 04/04/2021   c-sections  1983   CARPAL TUNNEL RELEASE     CESAREAN SECTION  1982   corrective eye surgery     EYE SURGERY     HAMMERTOE RECONSTRUCTION WITH WEIL OSTEOTOMY Right 12/07/2020   Procedure: 2-3 weil and hammertoe corrections;  Surgeon: Kit Rush, MD;  Location: [facility];  Service: Orthopedics;  Laterality: Right;   HERNIA REPAIR  04/2012   hiatial hernia   JOINT REPLACEMENT     lap nissen w/paraesophageal hernia repari  05/21/2010   TONSILECTOMY, ADENOIDECTOMY, BILATERAL MYRINGOTOMY AND TUBES     patient denies tubes in ears    TONSILLECTOMY     TOTAL HIP ARTHROPLASTY Right 06/03/2017   Procedure: RIGHT TOTAL HIP ARTHROPLASTY ANTERIOR APPROACH;  Surgeon: Ernie Cough, MD;  Location: [facility];  Service: Orthopedics;  Laterality: Right;  70 mins   TOTAL HIP ARTHROPLASTY Left 02/03/2018   Procedure: LEFT TOTAL HIP ARTHROPLASTY ANTERIOR APPROACH;  Surgeon: Ernie Cough, MD;  Location: [facility];  Service: Orthopedics;  Laterality: Left;  70 mins   Family History  Problem Relation Age of Onset   Hypertension Mother        Deceased   Anxiety disorder Mother    Heart disease Mother    Parkinson's disease Father        Deceased   Anxiety disorder Father    Asthma Father     Stroke Father    Drug abuse Son    Anxiety disorder Brother    Anxiety disorder Son    Drug abuse Son    Diabetes Son        Deceased, 63   Early death Son    Healthy Son    Social History   Socioeconomic History   Marital status: Married    Spouse name: Not on file   Number of children: 2   Years of education: Not on file   Highest education level: 12th grade  Occupational History   Occupation: None  Tobacco Use   Smoking status: Former    Current packs/day: 0.00    Types: Cigarettes    Quit date: 08/26/2008    Years since quitting: 15.8  Smokeless tobacco: Never  Vaping Use   Vaping status: Never Used  Substance and Sexual Activity   Alcohol use: No    Alcohol/week: 0.0 standard drinks of alcohol    Comment: hx of    Drug use: Yes    Comment: hx of years ago - marijuana    Sexual activity: Not Currently  Other Topics Concern   Not on file  Social History Narrative   She was last working in March 2016 and laid off.  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   Highest level of education:  High school   She lives alone.     Social Drivers of Corporate investment banker Strain: Low Risk  (06/17/2024)   Overall Financial Resource Strain (CARDIA)    Difficulty of Paying Living Expenses: Not hard at all  Food Insecurity: No Food Insecurity (06/17/2024)   Hunger Vital Sign    Worried About Running Out of Food in the Last Year: Never true    Ran Out of Food in the Last Year: Never true  Transportation Needs: No Transportation Needs (06/17/2024)   PRAPARE - Administrator, Civil Service (Medical): No    Lack of Transportation (Non-Medical): No  Physical Activity: Patient Declined (06/17/2024)   Exercise Vital Sign    Days of Exercise per Week: Patient declined    Minutes of Exercise per Session: Patient declined  Recent Concern: Physical Activity - Insufficiently Active (04/13/2024)   Exercise Vital Sign    Days  of Exercise per Week: 3 days    Minutes of Exercise per Session: 20 min  Stress: No Stress Concern Present (06/17/2024)   [employer] of Occupational Health - Occupational Stress Questionnaire    Feeling of Stress: Not at all  Recent Concern: Stress - Stress Concern Present (04/13/2024)   [employer] of Occupational Health - Occupational Stress Questionnaire    Feeling of Stress: To some extent  Social Connections: Socially Isolated (06/17/2024)   Social Connection and Isolation Panel    Frequency of Communication with Friends and Family: Once a week    Frequency of Social Gatherings with Friends and Family: Once a week    Attends Religious Services: Never    Database administrator or Organizations: No    Attends Engineer, structural: Never    Marital Status: Married    Tobacco Counseling Counseling given: Yes   Clinical Intake: Pre-visit preparation completed: Yes Pain : 0-10 Pain Score: 4  Pain Type: Chronic pain Pain Location: Hip Pain Orientation: Right Pain Descriptors / Indicators: Constant Pain Onset: More than a month ago Pain Frequency: Constant   BMI - recorded: 34.54 Nutritional Status: BMI > 30  Obese Nutritional Risks: None Diabetes: No No results found for: HGBA1C  How often do you need to have someone help you when you read instructions, pamphlets, or other written materials from your doctor or pharmacy?: 1 - Never Interpreter Needed?: No Information entered by :: Mellisa Arshad W CMA (AAMA)  Activities of Daily Living     06/17/2024    1:58 PM  In your present state of health, do you have any difficulty performing the following activities:  Hearing? 1  Comment referral placed  Vision? 0  Difficulty concentrating or making decisions? 1  Walking or climbing stairs? 1  Dressing or bathing? 0  Doing errands, shopping? 0  Preparing Food and eating ? N  Using the Toilet? N  In  the past six months, have you accidently leaked urine? N  Do  you have problems with loss of bowel control? N  Managing your Medications? N  Managing your Finances? N  Housekeeping or managing your Housekeeping? Y  Comment husband helps   Patient Care Team: Bevely Doffing, FNP as PCP - General (Family Medicine) Cindie Ole DASEN, MD as PCP - Electrophysiology (Cardiology) Rollin Dover, MD as Consulting Physician (Gastroenterology)  I have updated your Care Teams any recent Medical Services you may have received from other providers in the past year.     Assessment:   This is a routine wellness examination for Cheryl Hoover.  Hearing/Vision screen Hearing Screening - Comments:: Patient complains of difficulty with hearing. ENT referral placed. Patient is in agreement with treatment plan. Aware that the office will call with an appointment.   Vision Screening - Comments:: Wears rx glasses - up to date with routine eye exams with  Oneil Kawasaki  Goals Addressed               This Visit's Progress     I'd like to lose weight and stay active, healthy, and independent (pt-stated)          Depression Screen     06/17/2024    2:02 PM 05/07/2024    2:36 PM  PHQ 2/9 Scores  PHQ - 2 Score 1 2  PHQ- 9 Score 10 14     Fall Risk     06/17/2024    1:57 PM 05/07/2024    2:36 PM  Fall Risk   Falls in the past year? 0 0  Number falls in past yr: 0 0  Injury with Fall? 0 0  Risk for fall due to : No Fall Risks No Fall Risks  Follow up Falls evaluation completed;Education provided;Falls prevention discussed Falls evaluation completed    MEDICARE RISK AT HOME:  Medicare Risk at Home Any stairs in or around the home?: Yes If so, are there any without handrails?: No Home free of loose throw rugs in walkways, pet beds, electrical cords, etc?: Yes Life alert?: No Use of a cane, walker or w/c?: Yes Grab bars in the bathroom?: No Shower chair or bench in shower?: Yes Elevated toilet seat or a handicapped toilet?: Yes  TIMED UP AND GO: Was  the test performed?  No  Cognitive Function: 6CIT completed        06/17/2024    1:59 PM  6CIT Screen  What Year? 0 points  What month? 0 points  What time? 0 points  Count back from 20 0 points  Months in reverse 0 points  Repeat phrase 2 points  Total Score 2 points    Immunizations Immunization History  Administered Date(s) Administered   INFLUENZA, HIGH DOSE SEASONAL PF 05/08/2024   Moderna Covid-19 Fall Seasonal Vaccine 18yrs & older 06/21/2023, 05/08/2024   Moderna Sars-Covid-2 Vaccination 11/11/2019, 12/10/2019   Pfizer Covid-19 Vaccine Bivalent Booster 5y-11y 07/12/2022   Pneumococcal Conjugate Pcv21, Polysaccharide Crm197 Conjugaf 05/08/2024   Zoster Recombinant(Shingrix) 07/12/2022, 09/07/2022    Screening Tests Health Maintenance  Topic Date Due   Hepatitis C Screening  Never done   DTaP/Tdap/Td (1 - Tdap) Never done   Colonoscopy  Never done   DEXA SCAN  07/16/2024   COVID-19 Vaccine (6 - Mixed Product risk 2025-26 season) 11/05/2024   Mammogram  11/06/2024   Medicare Annual Wellness (AWV)  06/17/2025   Pneumococcal Vaccine: 50+ Years  Completed   Influenza Vaccine  Completed  Zoster Vaccines- Shingrix  Completed   Meningococcal B Vaccine  Aged Out    Health Maintenance Health Maintenance Due  Topic Date Due   Hepatitis C Screening  Never done   DTaP/Tdap/Td (1 - Tdap) Never done   Colonoscopy  Never done   Health Maintenance Items Addressed: DEXA ordered, audiology referral placed. Difficulty hearing bilateral, declined colon cancer screening  Additional Screening: Vision Screening: Recommended annual ophthalmology exams for early detection of glaucoma and other disorders of the eye. Would you like a referral to an eye doctor? No    Dental Screening: Recommended annual dental exams for proper oral hygiene  Community Resource Referral / Chronic Care Management: CRR required this visit?  No   CCM required this visit?  No  Plan:   I have  personally reviewed and noted the following in the patient's chart:   Medical and social history Use of alcohol, tobacco or illicit drugs  Current medications and supplements including opioid prescriptions. Patient is not currently taking opioid prescriptions. Functional ability and status Nutritional status Physical activity Advanced directives List of other physicians Hospitalizations, surgeries, and ER visits in previous 12 months Vitals Screenings to include cognitive, depression, and falls Referrals and appointments  In addition, I have reviewed and discussed with patient certain preventive protocols, quality metrics, and best practice recommendations. A written personalized care plan for preventive services as well as general preventive health recommendations were provided to patient.   Torey Reinard, CMA   06/17/2024   After Visit Summary: (MyChart) Due to this being a telephonic visit, the after visit summary with patients personalized plan was offered to patient via MyChart   Notes: Nothing significant to report at this time.

## 2024-07-08 ENCOUNTER — Ambulatory Visit: Payer: Self-pay

## 2024-07-08 NOTE — Telephone Encounter (Signed)
 FYI Only or Action Required?: FYI only for provider: appointment scheduled on 11/14.  Patient was last seen in primary care on 05/07/2024 by Mauro Elveria BROCKS, NP.  Called Nurse Triage reporting Hypertension (+).  Symptoms began several days ago.  Interventions attempted: Prescription medications: amlodipine , HCTZ.  Symptoms are: gradually worsening.  Triage Disposition: Go to ED Now (Notify PCP)  Patient/caregiver understands and will follow disposition?: No, wishes to speak with PCP  Message from [facility] sent at 07/08/2024  4:37 PM EST  Summary: 176/106 BP   Reason for Triage: BP has been higher than normal latest reading was 176/106.  Requesting sooner appointment, [phone]         Reason for Disposition  [1] Systolic BP >= 160 OR Diastolic >= 100 AND [2] cardiac (e.g., breathing difficulty, chest pain) or neurologic symptoms (e.g., new-onset blurred or double vision, unsteady gait)  Answer Assessment - Initial Assessment Questions Pt called in to report higher BP for the last few days. She has been recording them since 11/9 usually sitting SBP 150-160's. Today she had a reading of 176/106. She has not missed any doses of Norvasc  and has been taking her hydrochlorothiazide  daily for 3-4days due to leg edema. Denies SOB, and dizziness. States she has mild (1/10) burning sensation in her chest. She has not eaten today and deals with chronic heartburn. States that it is slightly different but her heartburn has been ongoing issue. Does not want to be evaluated in the ED for burning sensation. She will eat something to see if it goes away. If is changes or is persistent she will go in for evaluation.  Appt for BP med adjustment 11/14.    1. BLOOD PRESSURE: What is your blood pressure? Did you take at least two measurements 5 minutes apart?     176/106 and 162/96 (closer to what its been running)  2. ONSET: When did you take your blood pressure?     11/9 3. HOW: How did  you take your blood pressure? (e.g., automatic home BP monitor, visiting nurse)     Automatic arm cuff 4. HISTORY: Do you have a history of high blood pressure?     Hx of htn  5. MEDICINES: Are you taking any medicines for blood pressure? Have you missed any doses recently?     Amlodipine  and hydrochlorothiazide  PRN (taken daily for last 3-4 days) 6. OTHER SYMPTOMS: Do you have any symptoms? (e.g., blurred vision, chest pain, difficulty breathing, headache, weakness)     Mild burning in her chest- thinks its from not eating. 1/10 pain- heartburn normally- seems maybe similar- for a few hours now- ongoing- Nexium - just finished a bad bout of heartburn  Protocols used: Blood Pressure - High-A-AH

## 2024-07-09 ENCOUNTER — Other Ambulatory Visit: Payer: Self-pay

## 2024-07-09 ENCOUNTER — Ambulatory Visit: Payer: Self-pay

## 2024-07-09 VITALS — BP 173/125 | HR 84 | Ht 63.0 in | Wt 201.0 lb

## 2024-07-09 DIAGNOSIS — I10 Essential (primary) hypertension: Secondary | ICD-10-CM

## 2024-07-09 MED ORDER — HYDROCHLOROTHIAZIDE 12.5 MG PO CAPS: 12.5000 mg | ORAL_CAPSULE | Freq: Every day | ORAL | 1 refills | Status: AC

## 2024-07-09 MED ORDER — LOSARTAN POTASSIUM 50 MG PO TABS: 50.0000 mg | ORAL_TABLET | Freq: Every day | ORAL | 0 refills | Status: DC

## 2024-07-09 MED ORDER — LOSARTAN POTASSIUM 50 MG PO TABS: 50.0000 mg | ORAL_TABLET | Freq: Every day | ORAL | 1 refills | Status: AC

## 2024-07-09 NOTE — Progress Notes (Signed)
 Established Patient Office Visit  Subjective   Patient ID: Cheryl Hoover, female    DOB: 1955/12/05  Age: 68 y.o. MRN: [id number]  Chief Complaint  Patient presents with   Medical Management of Chronic Issues    Pt here for HTN     HPI Discussed the use of AI scribe software for clinical note transcription with the patient, who gave verbal consent to proceed.  History of Present Illness    Cheryl Hoover is a 68 year old female with hypertension who presents with elevated blood pressure.  Hypertension - Elevated blood pressure for several days, most recent reading 174/106 mmHg - Blood pressure not as high prior to initiation of amlodipine  - No other new medications aside from amlodipine  - No recent events associated with increased sodium intake  Lower extremity edema - Significant leg swelling over the past three days - Currently taking hydrochlorothiazide  as needed for swelling, taken regularly over the past three days - Swelling improved with use of support hose and diuretic - No noticeable effect of diuretic on blood pressure  Medication intolerance - Previous allergic reaction to lisinopril   Constitutional and neurologic symptoms - Headaches and fatigue - No other new symptoms     Patient Active Problem List   Diagnosis Date Noted   Uncontrolled hypertension 07/13/2024   Headache 04/13/2024   Trochanteric bursitis of right hip 04/22/2022   Trochanteric bursitis of left hip 11/11/2021   Sensorineural hearing loss (SNHL) of both ears 10/24/2021   Tinnitus of both ears 10/24/2021   Metatarsalgia of right foot 11/01/2020   Bunion 11/01/2020   Bipolar 2 disorder (HCC) 09/23/2018   Phobia 09/23/2018   Low back pain 07/10/2018   Obese 02/04/2018   Overweight (BMI 25.0-29.9) 06/04/2017   S/P right THA, AA 06/03/2017   Lumbosacral radiculopathy 04/26/2015   Degenerative lumbar spinal stenosis 04/26/2015   Sliding hiatal hernia, recurrent 09/16/2011    ANXIETY DEPRESSION 04/21/2009   Asthma 04/21/2009   GERD 04/21/2009   Constipation 04/21/2009   Idiopathic scoliosis and kyphoscoliosis 04/21/2009   EARLY SATIETY 04/21/2009   ABDOMINAL PAIN, EPIGASTRIC 04/21/2009   ANOREXIA NERVOSA, HX OF 04/21/2009   BULIMIA, HX OF 04/21/2009   Benign hypertension 09/26/1998      ROS    Objective:     BP (!) 173/125   Pulse 84   Ht 5' 3 (1.6 m)   Wt 201 lb (91.2 kg)   SpO2 95%   BMI 35.61 kg/m  BP Readings from Last 3 Encounters:  07/09/24 (!) 173/125  05/07/24 134/68  04/20/24 138/89   Wt Readings from Last 3 Encounters:  07/09/24 201 lb (91.2 kg)  06/17/24 195 lb (88.5 kg)  05/07/24 202 lb 12.8 oz (92 kg)     Physical Exam Vitals and nursing note reviewed.  Constitutional:      Appearance: Normal appearance. She is obese.  HENT:     Head: Normocephalic.  Eyes:     Extraocular Movements: Extraocular movements intact.     Pupils: Pupils are equal, round, and reactive to light.  Cardiovascular:     Rate and Rhythm: Normal rate and regular rhythm.  Pulmonary:     Effort: Pulmonary effort is normal.     Breath sounds: Normal breath sounds.  Musculoskeletal:     Cervical back: Normal range of motion and neck supple.  Neurological:     Mental Status: She is alert and oriented to person, place, and time.  Psychiatric:  Mood and Affect: Mood normal.        Thought Content: Thought content normal.      No results found for any visits on 07/09/24.    The ASCVD Risk score (Arnett DK, et al., 2019) failed to calculate for the following reasons:   Cannot find a previous HDL lab   Cannot find a previous total cholesterol lab    Assessment & Plan:   Problem List Items Addressed This Visit       Cardiovascular and Mediastinum   Uncontrolled hypertension - Primary   Hypertension poorly controlled. Amlodipine  likely contributing to edema and elevated blood pressure. Losartan considered due to lack of edema side  effect. - Discontinued amlodipine . - Prescribed losartan 50 mg, 30-day supply to [facility]. - Continue hydrochlorothiazide  as needed for swelling. - Advised to drink extra water  and monitor sodium intake. - Scheduled follow-up in six weeks for blood pressure check.      Relevant Medications   losartan (COZAAR) 50 MG tablet   hydrochlorothiazide  (MICROZIDE ) 12.5 MG capsule    Return in about 6 weeks (around 08/20/2024) for for recheck of blood pressure check.    Leita Longs, FNP

## 2024-07-09 NOTE — Telephone Encounter (Signed)
 Copied from CRM [id number]. Topic: Clinical - Medication Refill >> Jul 09, 2024  5:07 PM Everette C wrote: Medication: losartan (COZAAR) 50 MG tablet [[id number]]  Has the patient contacted their pharmacy? Yes (Agent: If no, request that the patient contact the pharmacy for the refill. If patient does not wish to contact the pharmacy document the reason why and proceed with request.) (Agent: If yes, when and what did the pharmacy advise?)  This is the patient's preferred pharmacy:  [facility] [address], [place] - [address] [place] [address] Phone: [phone] Fax: [phone]  Is this the correct pharmacy for this prescription? Yes If no, delete pharmacy and type the correct one.   Has the prescription been filled recently? Yes  Is the patient out of the medication? Yes  Has the patient been seen for an appointment in the last year OR does the patient have an upcoming appointment? Yes  Can we respond through MyChart? No  Agent: Please be advised that Rx refills may take up to 3 business days. We ask that you follow-up with your pharmacy.

## 2024-07-13 DIAGNOSIS — I10 Essential (primary) hypertension: Secondary | ICD-10-CM | POA: Insufficient documentation

## 2024-07-13 NOTE — Assessment & Plan Note (Signed)
 Hypertension poorly controlled. Amlodipine  likely contributing to edema and elevated blood pressure. Losartan considered due to lack of edema side effect. - Discontinued amlodipine . - Prescribed losartan 50 mg, 30-day supply to [employer]. - Continue hydrochlorothiazide  as needed for swelling. - Advised to drink extra water  and monitor sodium intake. - Scheduled follow-up in six weeks for blood pressure check.

## 2024-07-14 ENCOUNTER — Other Ambulatory Visit: Payer: Self-pay | Admitting: Psychiatry

## 2024-07-14 DIAGNOSIS — G2581 Restless legs syndrome: Secondary | ICD-10-CM

## 2024-07-14 DIAGNOSIS — F5105 Insomnia due to other mental disorder: Secondary | ICD-10-CM

## 2024-07-14 DIAGNOSIS — F3181 Bipolar II disorder: Secondary | ICD-10-CM

## 2024-07-16 ENCOUNTER — Other Ambulatory Visit: Payer: Self-pay | Admitting: Psychiatry

## 2024-07-20 ENCOUNTER — Ambulatory Visit (INDEPENDENT_AMBULATORY_CARE_PROVIDER_SITE_OTHER)

## 2024-07-20 VITALS — BP 159/83 | HR 66 | Resp 16 | Ht 63.0 in | Wt 202.1 lb

## 2024-07-20 DIAGNOSIS — I10 Essential (primary) hypertension: Secondary | ICD-10-CM

## 2024-07-20 NOTE — Progress Notes (Signed)
 Established Patient Office Visit  Subjective   Patient ID: Cheryl Hoover, female    DOB: 01/25/1956  Age: 68 y.o. MRN: [id number]  Chief Complaint  Patient presents with   Hypertension    Follow up     HPI Discussed the use of AI scribe software for clinical note transcription with the patient, who gave verbal consent to proceed.  History of Present Illness    Cheryl Hoover is a 68 year old female with hypertension who presents for follow-up of her blood pressure management.  Hypertension management - Blood pressure readings at home have improved, generally in the 130s/120s range, compared to previous readings of 173/125 - Current antihypertensive medication is more effective than previous regimen with amlodipine  - Amlodipine  previously caused peripheral edema, which has improved since discontinuation - Occasionally takes hydrochlorothiazide  as needed but prefers to avoid due to frequent urination - Does not check blood pressure as regularly as recommended - Recent emotional stress may be affecting blood pressure readings  Musculoskeletal pain and functional limitation - Chronic back and hip pain with history of spinal stenosis, scoliosis, and bilateral hip replacements - Hip pain sometimes radiates, causing difficulty with prolonged standing or walking, even with use of a cane - Uncertainty whether pain is primarily related to back or hip pathology  Emotional distress - Recent emotional difficulty after euthanizing her cat     Patient Active Problem List   Diagnosis Date Noted   Uncontrolled hypertension 07/13/2024   Headache 04/13/2024   Trochanteric bursitis of right hip 04/22/2022   Trochanteric bursitis of left hip 11/11/2021   Sensorineural hearing loss (SNHL) of both ears 10/24/2021   Tinnitus of both ears 10/24/2021   Metatarsalgia of right foot 11/01/2020   Bunion 11/01/2020   Bipolar 2 disorder (HCC) 09/23/2018   Phobia 09/23/2018   Low back pain  07/10/2018   Obesity, morbid (HCC) 02/04/2018   Overweight (BMI 25.0-29.9) 06/04/2017   S/P right THA, AA 06/03/2017   Lumbosacral radiculopathy 04/26/2015   Degenerative lumbar spinal stenosis 04/26/2015   Sliding hiatal hernia, recurrent 09/16/2011   ANXIETY DEPRESSION 04/21/2009   Asthma 04/21/2009   GERD 04/21/2009   Constipation 04/21/2009   Idiopathic scoliosis and kyphoscoliosis 04/21/2009   EARLY SATIETY 04/21/2009   ABDOMINAL PAIN, EPIGASTRIC 04/21/2009   ANOREXIA NERVOSA, HX OF 04/21/2009   BULIMIA, HX OF 04/21/2009   Benign hypertension 09/26/1998    ROS    Objective:     BP (!) 159/83   Pulse 66   Resp 16   Ht 5' 3 (1.6 m)   Wt 202 lb 1.9 oz (91.7 kg)   SpO2 97%   BMI 35.80 kg/m  BP Readings from Last 3 Encounters:  07/20/24 (!) 159/83  07/09/24 (!) 173/125  05/07/24 134/68   Wt Readings from Last 3 Encounters:  07/20/24 202 lb 1.9 oz (91.7 kg)  07/09/24 201 lb (91.2 kg)  06/17/24 195 lb (88.5 kg)     Physical Exam Vitals and nursing note reviewed.  Constitutional:      Appearance: Normal appearance.  HENT:     Head: Normocephalic.  Eyes:     Extraocular Movements: Extraocular movements intact.     Pupils: Pupils are equal, round, and reactive to light.  Cardiovascular:     Rate and Rhythm: Normal rate and regular rhythm.  Pulmonary:     Effort: Pulmonary effort is normal.     Breath sounds: Normal breath sounds.  Musculoskeletal:     Cervical back:  Normal range of motion and neck supple.  Neurological:     Mental Status: She is alert and oriented to person, place, and time.  Psychiatric:        Mood and Affect: Mood normal.        Thought Content: Thought content normal.      No results found for any visits on 07/20/24.    The ASCVD Risk score (Arnett DK, et al., 2019) failed to calculate for the following reasons:   Cannot find a previous HDL lab   Cannot find a previous total cholesterol lab    Assessment & Plan:    Problem List Items Addressed This Visit       Cardiovascular and Mediastinum   Uncontrolled hypertension - Primary   Blood pressure elevated today, likely due to situational stress. Home readings generally normal. Current medication regimen effective and better tolerated. - Continue current antihypertensive regimen. - Monitor blood pressure at home regularly. - Consider increasing losartan  if blood pressure remains elevated. - Follow up in a few months to reassess blood pressure control.        Other   Obesity, morbid (HCC)   Continue with lifestyle changes, including a healthy diet and regular exercise.         Return in about 3 months (around 10/20/2024) for chronic follow-up with PCP.    Leita Longs, FNP

## 2024-07-25 NOTE — Assessment & Plan Note (Signed)
 Continue with lifestyle changes, including a healthy diet and regular exercise.

## 2024-07-25 NOTE — Assessment & Plan Note (Signed)
 Blood pressure elevated today, likely due to situational stress. Home readings generally normal. Current medication regimen effective and better tolerated. - Continue current antihypertensive regimen. - Monitor blood pressure at home regularly. - Consider increasing losartan  if blood pressure remains elevated. - Follow up in a few months to reassess blood pressure control.

## 2024-07-27 ENCOUNTER — Encounter: Payer: Self-pay | Admitting: Psychiatry

## 2024-07-27 ENCOUNTER — Ambulatory Visit: Admitting: Psychiatry

## 2024-07-27 DIAGNOSIS — F4001 Agoraphobia with panic disorder: Secondary | ICD-10-CM | POA: Diagnosis not present

## 2024-07-27 DIAGNOSIS — G2581 Restless legs syndrome: Secondary | ICD-10-CM | POA: Diagnosis not present

## 2024-07-27 DIAGNOSIS — R413 Other amnesia: Secondary | ICD-10-CM | POA: Diagnosis not present

## 2024-07-27 DIAGNOSIS — F411 Generalized anxiety disorder: Secondary | ICD-10-CM

## 2024-07-27 DIAGNOSIS — F5105 Insomnia due to other mental disorder: Secondary | ICD-10-CM

## 2024-07-27 DIAGNOSIS — F3181 Bipolar II disorder: Secondary | ICD-10-CM | POA: Diagnosis not present

## 2024-07-27 MED ORDER — LAMOTRIGINE 100 MG PO TABS: ORAL_TABLET | ORAL | 3 refills | Status: AC

## 2024-07-27 MED ORDER — TOPIRAMATE 50 MG PO TABS: ORAL_TABLET | ORAL | 3 refills | Status: AC

## 2024-07-27 MED ORDER — TRAZODONE HCL 100 MG PO TABS: 100.0000 mg | ORAL_TABLET | Freq: Every day | ORAL | 3 refills | Status: AC

## 2024-07-27 MED ORDER — ROPINIROLE HCL 0.5 MG PO TABS: ORAL_TABLET | ORAL | 3 refills | Status: AC

## 2024-07-27 NOTE — Progress Notes (Signed)
 Cheryl Hoover [id number] 1955/12/22 68 y.o.   Subjective:   Patient ID:  Cheryl Hoover is a 68 y.o. (DOB 09/23/1955) female.  Chief Complaint:  Chief Complaint  Patient presents with   Follow-up   Depression   Anxiety   Medication Reaction    HPI Cheryl Hoover presents to the office today for follow-up of bipolar and anxiety.   seen April 2020.  Topiramate  was reduced to 50 mg in the morning and 100 mg in the evening.  No other meds were changed.  seen October 2020.  The following was noted: Forgot to reduce the topiramate  and has continued 100 mg BID.  12/21/19 appt noted the following without med changes except as noted: Kind of down in the dumps.  Doesn't like being at home so much.  Not sure of the cause.  Things are a lot different for us  bc so isolated and doing little.  Like to travel and can't now.  But did drive across the country and it was fun but not every day is like that.  Still tend to be short-tempered but Ok overall.  Low appetite.  Likes snacks.  Eats supper. Question if med related. Sedentary.  Doesn't know how to lose weight.  Weight stable Rare Xanax .  Doesn't like to drive but will do it. The only med change today is to decrease topiramate  100 mg tablets  To one half every morning and 1 every afternoon.  She didn't do this last time and appetite is still poor.   December 21, 2019 appointment, the following is noted: Compliant with meds. Wants change in clonazepam .  Tried to reduce to 1 1/2 tablets at night and couldn't sleep.  Currently sleeping well.  Except for bad dreams for 6 mos.  Usually don't awaken her at night.  Spouse woke her recently bc of talking in bad dream.Spke with clinical pharmacist who suggested trying to stop.    No SE.   For awhile appetite was better but still don't eat well.  No desire for food daytime until night and poor choices longterm.  No weight changes.  Little depression.  Not bad. Plan: The only med change today is to  decrease topiramate  100 mg tablets  To one half every morning and 1 every afternoon.  She didn't do this last time and appetite is still poor.   09/06/20 appt with following noted: Rarely takes alprazolam . Stopped clonazepam  and takes 100 mg trazodone  and sleeps well.  Clonazepam  hard to stop with WD and reduced later by 1/4 tablet every 2 weeks and took a couple of mos to get used to the change. Plan no changes.  08/16/21 appt noted: Pretty much ok since here.  Only bothered by family issues.   Satisfied with meds.  No SE problem except PM lamotrigine  makes her sleepy.   Wants ropinirole  written for 0.5 mg 1 at 5 and 1 at bedtime and trazodone  written for 100 mg HS. Tried video therapist with young female therapist and didn't work out. Plan: No med changes Continue lamotrigine  100 mg twice daily Continue alprazolam  0.5 mg daily as needed, used infrequently, Continue ropinirole  0.5 mg every 5 p.m. and at bedtime for restless legs Continue topiramate  50 mg every morning and 100 mg at 5 PM for anxiety and appetite control Continue trazodone  100 nightly  05/14/2022 appointment noted: Alright I guess.  Fine with meds.  Not as cheerful as I could be.   Stress with kids.    RLS  and sleep managed with meds.  Occ leg cramps. Upstream pharmacy bubble packs bc was mixing up topiramate  and ropinirole .  Consistent with meds. No SE. Trazodone  really helped. Rare panic.  A few phobias like plane being trapped in a small space and dentist. Plan no changes  05/15/23 appt noted: Meds as above No SE.   Some concern over meds and aging.  Even keel for now. Incredibly bored is main problem.  No sig friends nor family contact.  Watches a lot of TV. Some downhill DT health.  Not interested in exercise.  Scoliosis with back pain.inactive bc of it.   Likes to the [employer] and sew.  Reads and listens to books.   Patient reports stable mood and denies or irritable moods.  Patient reports recent difficulty with anxiety  mainly claustrophobia.. Occ bouts of feeling on edge of anxiety, speeded up but not really manic and not severe.    Patient denies difficulty with sleep initiation or maintenance. Poor appetite disturbance chronically without change.  Patient reports that energy and motivation have been chronically poor.  Patient denies any difficulty with concentration, but some issues with memory.  Patient denies any suicidal ideation.  No panic lately. No mania.  RLS managed. Used to overeat but not anymore.  Struggles with undereating.   07/27/24 appt noted:  Med: alprazolam  0.5 mg prn, lamotrigine  100 mg BID, ropinirole  0.5 mg pm and HS, topiramate  50 am and 100 PM, trazodone  100 mg HS Doing ok overall.  No major mood swings.  A little up and down.  Shut in.  Not interested in getting out much partly DT mobility issues.  Not really anxious about going out. No concerns with meds.  No SE Some leg cramps.  Takes potassium. Not sig dep.  Can't do much physically and sits around a lot. Sleep really well with meds.  RLS generally managed.   No sig binge/purge but worry over wt gain chronically.  Past Psychiatric Medication Trials: Wellbutrin, Pristiq, Nardil, Prozac, Paxil hypomania, Zoloft, imipramine, desipramine, Wellbutrin, Serzone 600 mg, buspirone, clonazepam ,  topiramate  100 mg twice daily, lamotrigine  100 mg twice daily, carbamazepine,  Saphris, gabapentin,  Latuda,   ropinirole ,   Trazodone  helped  Review of Systems:  Review of Systems  Constitutional:  Negative for appetite change and unexpected weight change.  HENT:  Positive for tinnitus.   Cardiovascular:  Negative for palpitations.  Musculoskeletal:  Positive for arthralgias and gait problem.  Neurological:  Negative for tremors and headaches.    Medications: I have reviewed the patient's current medications.  Current Outpatient Medications  Medication Sig Dispense Refill   acetaminophen  (TYLENOL ) 500 MG tablet Take 2 tablets (1,000 mg  total) by mouth every 8 (eight) hours. 30 tablet 0   albuterol  (VENTOLIN  HFA) 108 (90 BASE) MCG/ACT inhaler Inhale 2 puffs into the lungs every 4 (four) hours as needed for wheezing. 18 g 6   ALPRAZolam  (XANAX ) 0.5 MG tablet Take 1 tablet (0.5 mg total) by mouth daily as needed for anxiety or sleep. 30 tablet 0   docusate sodium  (COLACE) 100 MG capsule Take 1 capsule (100 mg total) by mouth 2 (two) times daily. While taking narcotic pain medicine. 30 capsule 0   esomeprazole  (NEXIUM ) 20 MG packet Take 20 mg by mouth daily before breakfast.     hydrochlorothiazide  (MICROZIDE ) 12.5 MG capsule Take 1 capsule (12.5 mg total) by mouth daily. 90 capsule 1   HYDROcodone -acetaminophen  (NORCO) 10-325 MG tablet TAKE 1 TABLET BY MOUTH 4 TIMES DAILY  AS NEEDED     loratadine  (CLARITIN ) 10 MG tablet Take 10 mg by mouth at bedtime.      losartan  (COZAAR ) 50 MG tablet Take 1 tablet (50 mg total) by mouth daily. 90 tablet 1   meloxicam (MOBIC) 15 MG tablet Take 15 mg by mouth daily.     montelukast  (SINGULAIR ) 10 MG tablet Take 1 tablet by mouth daily. 90 tablet 1   Multiple Vitamins-Minerals (MULTI COMPLETE PO) Take by mouth.     Vitamins A & D (VITAMIN A & D) ointment Apply 1 application topically as needed for dry skin.     lamoTRIgine  (LAMICTAL ) 100 MG tablet TAKE 1 TABLET BY MOUTH EACH MORNING AND TAKE 1 TABLET EVERY DAY AT BEDTIME 180 tablet 3   rOPINIRole  (REQUIP ) 0.5 MG tablet TAKE 1 TABLET BY MOUTH EVERY EVENING AND TAKE 1 TABLET BY MOUTH EVERYDAY AT BEDTIME 180 tablet 3   topiramate  (TOPAMAX ) 50 MG tablet Take 50 mg QAM and 100 mg at 5 PM 270 tablet 3   traZODone  (DESYREL ) 100 MG tablet Take 1 tablet (100 mg total) by mouth at bedtime. 90 tablet 3   No current facility-administered medications for this visit.    Medication Side Effects: Appetite Suppression and Other: ?  Allergies:  Allergies  Allergen Reactions   Lisinopril  Shortness Of Breath and Swelling    Possibly throat swelling?    Penicillins Other (See Comments)    Unknown Childhood allergy   Avocado Itching    Past Medical History:  Diagnosis Date   Allergy 05/18/1966   Date unknown   Anemia    Anxiety    Arthritis    Asthma    Bipolar disorder (HCC)    Complication of anesthesia    woke up during surgery    Depression    GERD (gastroesophageal reflux disease)    Hypertension    Scoliosis    Substance abuse (HCC)     Family History  Problem Relation Age of Onset   Hypertension Mother        Deceased   Anxiety disorder Mother    Heart disease Mother    Parkinson's disease Father        Deceased   Anxiety disorder Father    Asthma Father    Stroke Father    Drug abuse Son    Anxiety disorder Brother    Anxiety disorder Son    Drug abuse Son    Diabetes Son        Deceased, 45   Early death Son    Healthy Son     Social History   Socioeconomic History   Marital status: Married    Spouse name: Not on file   Number of children: 2   Years of education: Not on file   Highest education level: 12th grade  Occupational History   Occupation: None  Tobacco Use   Smoking status: Former    Current packs/day: 0.00    Types: Cigarettes    Quit date: 08/26/2008    Years since quitting: 15.9   Smokeless tobacco: Never  Vaping Use   Vaping status: Never Used  Substance and Sexual Activity   Alcohol use: No    Alcohol/week: 0.0 standard drinks of alcohol    Comment: hx of    Drug use: Yes    Comment: hx of years ago - marijuana    Sexual activity: Not Currently  Other Topics Concern   Not on file  Social History Narrative  She was last working in March 2016 and laid off.  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   Highest level of education:  High school   She lives alone.     Social Drivers of Corporate Investment Banker Strain: Low Risk  (06/17/2024)   Overall Financial Resource Strain (CARDIA)    Difficulty of Paying Living Expenses:  Not hard at all  Food Insecurity: No Food Insecurity (06/17/2024)   Hunger Vital Sign    Worried About Running Out of Food in the Last Year: Never true    Ran Out of Food in the Last Year: Never true  Transportation Needs: No Transportation Needs (06/17/2024)   PRAPARE - Administrator, Civil Service (Medical): No    Lack of Transportation (Non-Medical): No  Physical Activity: Patient Declined (06/17/2024)   Exercise Vital Sign    Days of Exercise per Week: Patient declined    Minutes of Exercise per Session: Patient declined  Recent Concern: Physical Activity - Insufficiently Active (04/13/2024)   Exercise Vital Sign    Days of Exercise per Week: 3 days    Minutes of Exercise per Session: 20 min  Stress: No Stress Concern Present (06/17/2024)   [employer] of Occupational Health - Occupational Stress Questionnaire    Feeling of Stress: Not at all  Recent Concern: Stress - Stress Concern Present (04/13/2024)   [employer] of Occupational Health - Occupational Stress Questionnaire    Feeling of Stress: To some extent  Social Connections: Socially Isolated (06/17/2024)   Social Connection and Isolation Panel    Frequency of Communication with Friends and Family: Once a week    Frequency of Social Gatherings with Friends and Family: Once a week    Attends Religious Services: Never    Database Administrator or Organizations: No    Attends Banker Meetings: Never    Marital Status: Married  Catering Manager Violence: Not At Risk (06/17/2024)   Humiliation, Afraid, Rape, and Kick questionnaire    Fear of Current or Ex-Partner: No    Emotionally Abused: No    Physically Abused: No    Sexually Abused: No    Past Medical History, Surgical history, Social history, and Family history were reviewed and updated as appropriate.   Please see review of systems for further details on the patient's review from today.   Objective:   Physical Exam:  There  were no vitals taken for this visit.  Physical Exam Exam conducted with a chaperone present.  Constitutional:      General: She is not in acute distress.    Appearance: She is well-developed.  Musculoskeletal:        General: No deformity.  Neurological:     Mental Status: She is alert and oriented to person, place, and time.     Cranial Nerves: No dysarthria.     Coordination: Coordination normal.  Psychiatric:        Attention and Perception: Attention and perception normal. She does not perceive auditory or visual hallucinations.        Mood and Affect: Mood is not anxious or depressed. Affect is not labile, angry, tearful or inappropriate.        Speech: Speech normal.        Behavior: Behavior normal. Behavior is cooperative.        Thought Content: Thought content normal. Thought content is not paranoid or delusional. Thought content does  not include homicidal or suicidal ideation. Thought content does not include suicidal plan.        Cognition and Memory: Cognition and memory normal.        Judgment: Judgment normal.     Comments:       Lab Review:     Component Value Date/Time   NA 141 04/17/2024 1256   K 3.8 04/17/2024 1256   CL 109 04/17/2024 1256   CO2 19 (L) 04/17/2024 1256   GLUCOSE 161 (H) 04/17/2024 1256   BUN 28 (H) 04/17/2024 1256   CREATININE 1.17 (H) 04/17/2024 1256   CREATININE 1.05 12/15/2014 1424   CALCIUM 9.9 04/17/2024 1256   PROT 7.2 04/17/2024 1256   ALBUMIN  4.0 04/17/2024 1256   AST 26 04/17/2024 1256   ALT 24 04/17/2024 1256   ALKPHOS 89 04/17/2024 1256   BILITOT 0.5 04/17/2024 1256   GFRNONAA 51 (L) 04/17/2024 1256   GFRNONAA 59 (L) 12/15/2014 1424   GFRAA >60 02/04/2018 0531   GFRAA 68 12/15/2014 1424       Component Value Date/Time   WBC 10.8 (H) 04/17/2024 1256   RBC 4.01 04/17/2024 1256   HGB 12.9 04/17/2024 1256   HCT 39.5 04/17/2024 1256   PLT 256 04/17/2024 1256   MCV 98.5 04/17/2024 1256   MCH 32.2 04/17/2024 1256   MCHC  32.7 04/17/2024 1256   RDW 12.5 04/17/2024 1256   LYMPHSABS 1.0 04/17/2024 1256   MONOABS 0.1 04/17/2024 1256   EOSABS 0.0 04/17/2024 1256   BASOSABS 0.0 04/17/2024 1256    No results found for: POCLITH, LITHIUM   No results found for: PHENYTOIN, PHENOBARB, VALPROATE, CBMZ   .res Assessment: Plan:    Bipolar II disorder (HCC) - Plan: lamoTRIgine  (LAMICTAL ) 100 MG tablet  Generalized anxiety disorder - Plan: topiramate  (TOPAMAX ) 50 MG tablet  Panic disorder with agoraphobia and moderate panic attacks - Plan: topiramate  (TOPAMAX ) 50 MG tablet  Insomnia due to mental condition - Plan: traZODone  (DESYREL ) 100 MG tablet  Restless leg syndrome, controlled - Plan: rOPINIRole  (REQUIP ) 0.5 MG tablet  Memory loss   Patient has a long history of bipolar disorder and has failed multiple medications as noted above.  She is fairly stable with regard to her mood.  She has chronic residual anxiety and social avoidance stomach agoraphobia but not actively having panic attacks.  Overall doing pretty well.  She complains of a poor appetite but has not lost significant weight.  We discussed that this could be a side effect of topiramate .  She reduced the morning dose to 50 mg in the morning and keep 100 mg in the evening and see if that improves her appetite during the day.   Let us  know if it causes any mood instability.  Disc the importance of protein to maintain health mentally and otherwise.  Rare use of Xanax .  Restless legs is managed fairly well most of the time with reduced ropinirole  0.5 mg q 5 and HS  Disc possibility of counseling. Disc mangement of leg cramps. Encourage SR citizen but she's socially avoidant.  No other med change indicated today Plan: No med changes Continue lamotrigine  100 mg twice daily Continue alprazolam  0.5 mg daily as needed, used infrequently, Continue ropinirole  0.5 mg every 5 p.m. and at bedtime for restless legs Continue topiramate  50 mg  every morning and 100 mg at 5 PM for anxiety and appetite control Continue trazodone  100 nightly  Check B12 and folate DT memory concerns.  Being on  AED and reflux meds and increases risk.  Follow-up 9-12 months  Lorene Macintosh MD, DFAPA  Please see After Visit Summary for patient specific instructions.  Future Appointments  Date Time Provider Department Center  11/01/2024  4:20 PM Bevely Doffing, FNP [facility] [address]  06/21/2025  9:20 AM [facility]ANNUAL WELLNESS VISIT [facility] [address]     No orders of the defined types were placed in this encounter.     -------------------------------

## 2024-07-28 ENCOUNTER — Other Ambulatory Visit: Payer: Self-pay | Admitting: Internal Medicine

## 2024-07-28 DIAGNOSIS — J45909 Unspecified asthma, uncomplicated: Secondary | ICD-10-CM

## 2024-07-28 MED ORDER — ALBUTEROL SULFATE HFA 108 (90 BASE) MCG/ACT IN AERS: 2.0000 | INHALATION_SPRAY | RESPIRATORY_TRACT | 6 refills | Status: AC | PRN

## 2024-09-15 NOTE — Progress Notes (Signed)
 Cheryl Hoover                                          MRN: [id number]   09/15/2024   The [facility] Quality Team Specialist reviewed this patient medical record for the purposes of chart review for care gap closure. The following were reviewed: chart review for care gap closure-colorectal cancer screening.    [facility] Quality Team

## 2024-09-28 ENCOUNTER — Telehealth: Payer: Self-pay

## 2024-09-28 ENCOUNTER — Other Ambulatory Visit: Payer: Self-pay

## 2024-09-28 DIAGNOSIS — J4520 Mild intermittent asthma, uncomplicated: Secondary | ICD-10-CM

## 2024-09-28 MED ORDER — MONTELUKAST SODIUM 10 MG PO TABS: ORAL_TABLET | ORAL | 1 refills | Status: AC

## 2024-09-28 NOTE — Telephone Encounter (Signed)
 Sent to pharmacy

## 2024-09-28 NOTE — Telephone Encounter (Signed)
 Copied from CRM #[id number]. Topic: Clinical - Medication Refill >> Sep 28, 2024  2:13 PM Gattis SQUIBB wrote: Medication: Montelukast  10 mg  Has the patient contacted their pharmacy? No (Agent: If no, request that the patient contact the pharmacy for the refill. If patient does not wish to contact the pharmacy document the reason why and proceed with request.) (Agent: If yes, when and what did the pharmacy advise?)  This is the patient's preferred pharmacy:  [facility], [place] - [address] [place] [address] Phone: [phone] Fax: [phone]   Is this the correct pharmacy for this prescription? Yes If no, delete pharmacy and type the correct one.   Has the prescription been filled recently? Yes  Is the patient out of the medication? No  Has the patient been seen for an appointment in the last year OR does the patient have an upcoming appointment? Yes  Can we respond through MyChart? No  Agent: Please be advised that Rx refills may take up to 3 business days. We ask that you follow-up with your pharmacy.

## 2024-09-29 ENCOUNTER — Other Ambulatory Visit: Payer: Self-pay

## 2024-09-29 DIAGNOSIS — I739 Peripheral vascular disease, unspecified: Secondary | ICD-10-CM

## 2024-10-26 ENCOUNTER — Encounter: Admitting: Vascular Surgery

## 2024-10-26 ENCOUNTER — Encounter

## 2024-11-01 ENCOUNTER — Ambulatory Visit

## 2025-06-21 ENCOUNTER — Ambulatory Visit

## 2025-07-28 ENCOUNTER — Ambulatory Visit: Admitting: Psychiatry
